# Patient Record
Sex: Female | Born: 1950
Health system: Southern US, Community
[De-identification: ages and names within clinical notes are randomized; demographics above are authoritative.]

## PROBLEM LIST (undated history)

## (undated) DIAGNOSIS — T4145XA Adverse effect of unspecified anesthetic, initial encounter: Secondary | ICD-10-CM

## (undated) DIAGNOSIS — Z9289 Personal history of other medical treatment: Secondary | ICD-10-CM

## (undated) DIAGNOSIS — C4490 Unspecified malignant neoplasm of skin, unspecified: Secondary | ICD-10-CM

## (undated) DIAGNOSIS — M810 Age-related osteoporosis without current pathological fracture: Secondary | ICD-10-CM

## (undated) DIAGNOSIS — Z7189 Other specified counseling: Secondary | ICD-10-CM

## (undated) DIAGNOSIS — Z9221 Personal history of antineoplastic chemotherapy: Secondary | ICD-10-CM

## (undated) DIAGNOSIS — T8859XA Other complications of anesthesia, initial encounter: Secondary | ICD-10-CM

## (undated) DIAGNOSIS — H9312 Tinnitus, left ear: Secondary | ICD-10-CM

## (undated) DIAGNOSIS — C50911 Malignant neoplasm of unspecified site of right female breast: Secondary | ICD-10-CM

## (undated) DIAGNOSIS — C785 Secondary malignant neoplasm of large intestine and rectum: Principal | ICD-10-CM

## (undated) DIAGNOSIS — B029 Zoster without complications: Secondary | ICD-10-CM

## (undated) HISTORY — DX: Zoster without complications: B02.9

## (undated) HISTORY — DX: Other specified counseling: Z71.89

## (undated) HISTORY — DX: Age-related osteoporosis without current pathological fracture: M81.0

## (undated) HISTORY — PX: COLONOSCOPY: SHX174

## (undated) HISTORY — DX: Tinnitus, left ear: H93.12

## (undated) HISTORY — PX: CATARACT EXTRACTION: SUR2

## (undated) HISTORY — DX: Malignant neoplasm of unspecified site of right female breast: C50.911

## (undated) HISTORY — DX: Secondary malignant neoplasm of large intestine and rectum: C78.5

## (undated) HISTORY — DX: Personal history of other medical treatment: Z92.89

---

## 1976-07-04 HISTORY — PX: OTHER SURGICAL HISTORY: SHX169

## 2000-08-07 ENCOUNTER — Other Ambulatory Visit: Admission: RE | Admit: 2000-08-07 | Discharge: 2000-08-07 | Payer: Self-pay | Admitting: Obstetrics & Gynecology

## 2000-08-10 ENCOUNTER — Encounter: Payer: Self-pay | Admitting: Obstetrics & Gynecology

## 2000-08-10 ENCOUNTER — Encounter: Admission: RE | Admit: 2000-08-10 | Discharge: 2000-08-10 | Payer: Self-pay | Admitting: Obstetrics & Gynecology

## 2001-09-10 ENCOUNTER — Other Ambulatory Visit: Admission: RE | Admit: 2001-09-10 | Discharge: 2001-09-10 | Payer: Self-pay | Admitting: Obstetrics & Gynecology

## 2001-09-14 ENCOUNTER — Encounter: Payer: Self-pay | Admitting: Obstetrics & Gynecology

## 2001-09-14 ENCOUNTER — Encounter: Admission: RE | Admit: 2001-09-14 | Discharge: 2001-09-14 | Payer: Self-pay | Admitting: Obstetrics & Gynecology

## 2002-10-08 ENCOUNTER — Other Ambulatory Visit: Admission: RE | Admit: 2002-10-08 | Discharge: 2002-10-08 | Payer: Self-pay | Admitting: Obstetrics and Gynecology

## 2003-12-12 ENCOUNTER — Other Ambulatory Visit: Admission: RE | Admit: 2003-12-12 | Discharge: 2003-12-12 | Payer: Self-pay | Admitting: Obstetrics and Gynecology

## 2004-04-05 ENCOUNTER — Ambulatory Visit (HOSPITAL_COMMUNITY): Admission: RE | Admit: 2004-04-05 | Discharge: 2004-04-05 | Payer: Self-pay | Admitting: Gastroenterology

## 2004-04-05 ENCOUNTER — Encounter (INDEPENDENT_AMBULATORY_CARE_PROVIDER_SITE_OTHER): Payer: Self-pay | Admitting: *Deleted

## 2005-04-26 ENCOUNTER — Other Ambulatory Visit: Admission: RE | Admit: 2005-04-26 | Discharge: 2005-04-26 | Payer: Self-pay | Admitting: Obstetrics and Gynecology

## 2008-07-02 ENCOUNTER — Encounter: Admission: RE | Admit: 2008-07-02 | Discharge: 2008-07-02 | Payer: Self-pay | Admitting: Obstetrics and Gynecology

## 2010-11-19 NOTE — Op Note (Signed)
NAMESHAREENA, NUSZ                  ACCOUNT NO.:  [id number]   MEDICAL RECORD NO.:  [id number]          PATIENT TYPE:  AMB   LOCATION:  ENDO                         FACILITY:  [facility]   PHYSICIAN:  Anselmo Rod, M.D.  DATE OF BIRTH:  [birth year]   DATE OF PROCEDURE:  04/05/2004  DATE OF DISCHARGE:                                 OPERATIVE REPORT   PROCEDURE PERFORMED:  Colonoscopy with cold biopsies x1.   ENDOSCOPIST:  Anselmo Rod, M.D.   INSTRUMENT USED:  Olympus video colonoscope.   INDICATION FOR PROCEDURE:  A 60 year old white female undergoing a screening  colonoscopy to rule out colonic polyps, etc.   PREPROCEDURE PREPARATION:  Informed consent was procured from the patient.  The patient was fasted for eight hours prior to the procedure and prepped  with a bottle of magnesium citrate and a gallon of GoLYTELY the night prior  to the procedure.   PREPROCEDURE PHYSICAL:  VITAL SIGNS:  The patient had stable vital signs.  NECK:  Supple.  CHEST:  Clear to auscultation.  S1, S2 regular.  ABDOMEN:  Soft with normal bowel sounds.   DESCRIPTION OF PROCEDURE:  The patient was placed in the left lateral  decubitus position and sedated with 75 mg of Demerol and 7.5 mg of Versed in  slow incremental doses.  Once the patient was adequately sedate and  maintained on low-flow oxygen and continuous cardiac monitoring, the Olympus  video colonoscope was advanced from the rectum to the cecum.  The  appendiceal orifice and the ileocecal valve were clearly visualized and  photographed.  A small sessile polyp was biopsied from the cecal base.  A  small internal hemorrhoid was seen on retroflexion.  There was no evidence  of diverticulosis.  The terminal ileum appeared healthy.  The appendiceal  orifice and the ileocecal valve were visualized and photographed.   IMPRESSION:  1.  Small internal hemorrhoids  2.  Small sessile polyp biopsied with the cold biopsy forceps from the cecal  base.  3.  Normal terminal ileum.   RECOMMENDATIONS:  1.  Await pathology results.  2.  Repeat colonoscopy depending on pathology results.  3.  Outpatient follow-up as the need arises in the future.  The patient will      have a colonoscopy at an earlier date if she has any abnormal symptoms      in the interim.       JNM/MEDQ  D:  04/05/2004  T:  04/05/2004  Job:  [id number]   cc:   Marcelino Duster L. Vincente Poli, M.D.  [address], [address]  [place]  [place] [address]  Fax: [phone]   Doris Cheadle. Foy Guadalajara, M.D.  [address] [place]  [place] [address]  Fax: [phone]

## 2012-03-28 ENCOUNTER — Other Ambulatory Visit (HOSPITAL_BASED_OUTPATIENT_CLINIC_OR_DEPARTMENT_OTHER): Payer: Self-pay | Admitting: Family Medicine

## 2012-03-28 DIAGNOSIS — M545 Low back pain: Secondary | ICD-10-CM

## 2012-03-31 ENCOUNTER — Ambulatory Visit (HOSPITAL_BASED_OUTPATIENT_CLINIC_OR_DEPARTMENT_OTHER)
Admission: RE | Admit: 2012-03-31 | Discharge: 2012-03-31 | Disposition: A | Discharge status: Home / Self Care | Payer: Federal, State, Local not specified - PPO | Source: Ambulatory Visit | Attending: Family Medicine | Admitting: Family Medicine

## 2012-03-31 DIAGNOSIS — M545 Low back pain: Secondary | ICD-10-CM

## 2012-03-31 DIAGNOSIS — M5126 Other intervertebral disc displacement, lumbar region: Secondary | ICD-10-CM | POA: Insufficient documentation

## 2012-06-13 ENCOUNTER — Other Ambulatory Visit: Payer: Self-pay | Admitting: Family Medicine

## 2012-06-13 DIAGNOSIS — R109 Unspecified abdominal pain: Secondary | ICD-10-CM

## 2012-06-14 ENCOUNTER — Ambulatory Visit
Admission: RE | Admit: 2012-06-14 | Discharge: 2012-06-14 | Disposition: A | Discharge status: Home / Self Care | Payer: Federal, State, Local not specified - PPO | Source: Ambulatory Visit | Attending: Family Medicine | Admitting: Family Medicine

## 2012-06-14 DIAGNOSIS — R109 Unspecified abdominal pain: Secondary | ICD-10-CM

## 2012-08-08 ENCOUNTER — Other Ambulatory Visit: Payer: Self-pay | Admitting: Obstetrics and Gynecology

## 2013-11-06 ENCOUNTER — Other Ambulatory Visit: Payer: Self-pay | Admitting: Obstetrics and Gynecology

## 2013-11-14 ENCOUNTER — Other Ambulatory Visit: Payer: Self-pay | Admitting: Obstetrics and Gynecology

## 2013-11-14 DIAGNOSIS — R928 Other abnormal and inconclusive findings on diagnostic imaging of breast: Secondary | ICD-10-CM

## 2013-12-04 ENCOUNTER — Ambulatory Visit
Admission: RE | Admit: 2013-12-04 | Discharge: 2013-12-04 | Disposition: A | Discharge status: Home / Self Care | Payer: Federal, State, Local not specified - PPO | Source: Ambulatory Visit | Attending: Obstetrics and Gynecology | Admitting: Obstetrics and Gynecology

## 2013-12-04 ENCOUNTER — Encounter (INDEPENDENT_AMBULATORY_CARE_PROVIDER_SITE_OTHER): Payer: Self-pay

## 2013-12-04 DIAGNOSIS — R928 Other abnormal and inconclusive findings on diagnostic imaging of breast: Secondary | ICD-10-CM

## 2014-04-30 ENCOUNTER — Other Ambulatory Visit: Payer: Self-pay | Admitting: Obstetrics and Gynecology

## 2014-04-30 DIAGNOSIS — N63 Unspecified lump in unspecified breast: Secondary | ICD-10-CM

## 2014-06-06 ENCOUNTER — Ambulatory Visit
Admission: RE | Admit: 2014-06-06 | Discharge: 2014-06-06 | Disposition: A | Discharge status: Home / Self Care | Payer: Federal, State, Local not specified - PPO | Source: Ambulatory Visit | Attending: Obstetrics and Gynecology | Admitting: Obstetrics and Gynecology

## 2014-06-06 DIAGNOSIS — N63 Unspecified lump in unspecified breast: Secondary | ICD-10-CM

## 2015-01-26 ENCOUNTER — Other Ambulatory Visit: Payer: Self-pay | Admitting: Obstetrics and Gynecology

## 2015-01-26 DIAGNOSIS — N63 Unspecified lump in unspecified breast: Secondary | ICD-10-CM

## 2015-01-27 LAB — CYTOLOGY - PAP

## 2015-01-29 ENCOUNTER — Ambulatory Visit
Admission: RE | Admit: 2015-01-29 | Discharge: 2015-01-29 | Disposition: A | Discharge status: Home / Self Care | Payer: Federal, State, Local not specified - PPO | Source: Ambulatory Visit | Attending: Obstetrics and Gynecology | Admitting: Obstetrics and Gynecology

## 2015-01-29 DIAGNOSIS — N63 Unspecified lump in unspecified breast: Secondary | ICD-10-CM

## 2015-10-01 DIAGNOSIS — R1909 Other intra-abdominal and pelvic swelling, mass and lump: Secondary | ICD-10-CM | POA: Diagnosis not present

## 2015-10-01 DIAGNOSIS — K5900 Constipation, unspecified: Secondary | ICD-10-CM | POA: Diagnosis not present

## 2015-10-01 DIAGNOSIS — R1031 Right lower quadrant pain: Secondary | ICD-10-CM | POA: Diagnosis not present

## 2015-10-05 ENCOUNTER — Other Ambulatory Visit (HOSPITAL_BASED_OUTPATIENT_CLINIC_OR_DEPARTMENT_OTHER): Payer: Self-pay | Admitting: Family Medicine

## 2015-10-05 DIAGNOSIS — R1031 Right lower quadrant pain: Secondary | ICD-10-CM

## 2015-10-08 ENCOUNTER — Ambulatory Visit (HOSPITAL_BASED_OUTPATIENT_CLINIC_OR_DEPARTMENT_OTHER)
Admission: RE | Admit: 2015-10-08 | Discharge: 2015-10-08 | Disposition: A | Discharge status: Home / Self Care | Payer: Medicare Other | Source: Ambulatory Visit | Attending: Family Medicine | Admitting: Family Medicine

## 2015-10-08 DIAGNOSIS — R1031 Right lower quadrant pain: Secondary | ICD-10-CM | POA: Diagnosis not present

## 2015-10-19 DIAGNOSIS — K5900 Constipation, unspecified: Secondary | ICD-10-CM | POA: Diagnosis not present

## 2015-10-19 DIAGNOSIS — R109 Unspecified abdominal pain: Secondary | ICD-10-CM | POA: Diagnosis not present

## 2015-12-24 ENCOUNTER — Other Ambulatory Visit: Payer: Self-pay | Admitting: Gastroenterology

## 2015-12-24 DIAGNOSIS — K5730 Diverticulosis of large intestine without perforation or abscess without bleeding: Secondary | ICD-10-CM | POA: Diagnosis not present

## 2015-12-24 DIAGNOSIS — Z1211 Encounter for screening for malignant neoplasm of colon: Secondary | ICD-10-CM | POA: Diagnosis not present

## 2015-12-24 DIAGNOSIS — D126 Benign neoplasm of colon, unspecified: Secondary | ICD-10-CM | POA: Diagnosis not present

## 2015-12-24 DIAGNOSIS — D120 Benign neoplasm of cecum: Secondary | ICD-10-CM | POA: Diagnosis not present

## 2016-01-14 ENCOUNTER — Other Ambulatory Visit: Payer: Self-pay | Admitting: Obstetrics and Gynecology

## 2016-01-14 DIAGNOSIS — N6320 Unspecified lump in the left breast, unspecified quadrant: Secondary | ICD-10-CM

## 2016-02-01 ENCOUNTER — Ambulatory Visit
Admission: RE | Admit: 2016-02-01 | Discharge: 2016-02-01 | Disposition: A | Discharge status: Home / Self Care | Payer: Medicare Other | Source: Ambulatory Visit | Attending: Obstetrics and Gynecology | Admitting: Obstetrics and Gynecology

## 2016-02-01 DIAGNOSIS — N6320 Unspecified lump in the left breast, unspecified quadrant: Secondary | ICD-10-CM

## 2016-02-01 DIAGNOSIS — N63 Unspecified lump in breast: Secondary | ICD-10-CM | POA: Diagnosis not present

## 2016-02-01 DIAGNOSIS — R922 Inconclusive mammogram: Secondary | ICD-10-CM | POA: Diagnosis not present

## 2016-03-28 DIAGNOSIS — Z124 Encounter for screening for malignant neoplasm of cervix: Secondary | ICD-10-CM | POA: Diagnosis not present

## 2016-03-28 DIAGNOSIS — Z6821 Body mass index (BMI) 21.0-21.9, adult: Secondary | ICD-10-CM | POA: Diagnosis not present

## 2016-04-19 DIAGNOSIS — S8991XA Unspecified injury of right lower leg, initial encounter: Secondary | ICD-10-CM | POA: Diagnosis not present

## 2016-04-19 DIAGNOSIS — M7989 Other specified soft tissue disorders: Secondary | ICD-10-CM | POA: Diagnosis not present

## 2016-04-19 DIAGNOSIS — M545 Low back pain: Secondary | ICD-10-CM | POA: Diagnosis not present

## 2016-05-30 DIAGNOSIS — Z23 Encounter for immunization: Secondary | ICD-10-CM | POA: Diagnosis not present

## 2016-09-26 DIAGNOSIS — L989 Disorder of the skin and subcutaneous tissue, unspecified: Secondary | ICD-10-CM | POA: Diagnosis not present

## 2016-11-22 DIAGNOSIS — D225 Melanocytic nevi of trunk: Secondary | ICD-10-CM | POA: Diagnosis not present

## 2016-11-22 DIAGNOSIS — L821 Other seborrheic keratosis: Secondary | ICD-10-CM | POA: Diagnosis not present

## 2016-11-22 DIAGNOSIS — L820 Inflamed seborrheic keratosis: Secondary | ICD-10-CM | POA: Diagnosis not present

## 2016-11-22 DIAGNOSIS — C44519 Basal cell carcinoma of skin of other part of trunk: Secondary | ICD-10-CM | POA: Diagnosis not present

## 2016-11-22 DIAGNOSIS — D485 Neoplasm of uncertain behavior of skin: Secondary | ICD-10-CM | POA: Diagnosis not present

## 2016-11-22 DIAGNOSIS — D2372 Other benign neoplasm of skin of left lower limb, including hip: Secondary | ICD-10-CM | POA: Diagnosis not present

## 2016-12-07 DIAGNOSIS — C44519 Basal cell carcinoma of skin of other part of trunk: Secondary | ICD-10-CM | POA: Diagnosis not present

## 2016-12-09 DIAGNOSIS — M7660 Achilles tendinitis, unspecified leg: Secondary | ICD-10-CM | POA: Diagnosis not present

## 2017-03-20 ENCOUNTER — Other Ambulatory Visit: Payer: Self-pay | Admitting: Obstetrics and Gynecology

## 2017-03-20 DIAGNOSIS — Z1231 Encounter for screening mammogram for malignant neoplasm of breast: Secondary | ICD-10-CM

## 2017-03-28 DIAGNOSIS — M1712 Unilateral primary osteoarthritis, left knee: Secondary | ICD-10-CM | POA: Diagnosis not present

## 2017-04-03 DIAGNOSIS — M1712 Unilateral primary osteoarthritis, left knee: Secondary | ICD-10-CM | POA: Diagnosis not present

## 2017-04-05 ENCOUNTER — Ambulatory Visit
Admission: RE | Admit: 2017-04-05 | Discharge: 2017-04-05 | Disposition: A | Discharge status: Home / Self Care | Payer: Medicare Other | Source: Ambulatory Visit | Attending: Obstetrics and Gynecology | Admitting: Obstetrics and Gynecology

## 2017-04-05 DIAGNOSIS — Z1231 Encounter for screening mammogram for malignant neoplasm of breast: Secondary | ICD-10-CM

## 2017-04-06 DIAGNOSIS — M1712 Unilateral primary osteoarthritis, left knee: Secondary | ICD-10-CM | POA: Diagnosis not present

## 2017-04-10 DIAGNOSIS — R42 Dizziness and giddiness: Secondary | ICD-10-CM | POA: Diagnosis not present

## 2017-04-10 DIAGNOSIS — Z0000 Encounter for general adult medical examination without abnormal findings: Secondary | ICD-10-CM | POA: Diagnosis not present

## 2017-04-10 DIAGNOSIS — E785 Hyperlipidemia, unspecified: Secondary | ICD-10-CM | POA: Diagnosis not present

## 2017-04-10 DIAGNOSIS — M1712 Unilateral primary osteoarthritis, left knee: Secondary | ICD-10-CM | POA: Diagnosis not present

## 2017-04-12 DIAGNOSIS — M1712 Unilateral primary osteoarthritis, left knee: Secondary | ICD-10-CM | POA: Diagnosis not present

## 2017-04-27 DIAGNOSIS — M1712 Unilateral primary osteoarthritis, left knee: Secondary | ICD-10-CM | POA: Diagnosis not present

## 2017-04-28 DIAGNOSIS — Z23 Encounter for immunization: Secondary | ICD-10-CM | POA: Diagnosis not present

## 2017-05-03 DIAGNOSIS — M1712 Unilateral primary osteoarthritis, left knee: Secondary | ICD-10-CM | POA: Diagnosis not present

## 2017-05-05 DIAGNOSIS — M1712 Unilateral primary osteoarthritis, left knee: Secondary | ICD-10-CM | POA: Diagnosis not present

## 2017-05-08 DIAGNOSIS — M1712 Unilateral primary osteoarthritis, left knee: Secondary | ICD-10-CM | POA: Diagnosis not present

## 2017-05-12 DIAGNOSIS — M1712 Unilateral primary osteoarthritis, left knee: Secondary | ICD-10-CM | POA: Diagnosis not present

## 2017-05-15 DIAGNOSIS — M1712 Unilateral primary osteoarthritis, left knee: Secondary | ICD-10-CM | POA: Diagnosis not present

## 2017-05-17 DIAGNOSIS — M1712 Unilateral primary osteoarthritis, left knee: Secondary | ICD-10-CM | POA: Diagnosis not present

## 2017-05-22 DIAGNOSIS — M1712 Unilateral primary osteoarthritis, left knee: Secondary | ICD-10-CM | POA: Diagnosis not present

## 2017-07-12 DIAGNOSIS — C44311 Basal cell carcinoma of skin of nose: Secondary | ICD-10-CM | POA: Diagnosis not present

## 2017-07-12 DIAGNOSIS — L570 Actinic keratosis: Secondary | ICD-10-CM | POA: Diagnosis not present

## 2017-07-12 DIAGNOSIS — D225 Melanocytic nevi of trunk: Secondary | ICD-10-CM | POA: Diagnosis not present

## 2017-07-12 DIAGNOSIS — D2372 Other benign neoplasm of skin of left lower limb, including hip: Secondary | ICD-10-CM | POA: Diagnosis not present

## 2017-07-12 DIAGNOSIS — L821 Other seborrheic keratosis: Secondary | ICD-10-CM | POA: Diagnosis not present

## 2017-07-12 DIAGNOSIS — D485 Neoplasm of uncertain behavior of skin: Secondary | ICD-10-CM | POA: Diagnosis not present

## 2017-07-12 DIAGNOSIS — Z23 Encounter for immunization: Secondary | ICD-10-CM | POA: Diagnosis not present

## 2017-07-12 DIAGNOSIS — Z85828 Personal history of other malignant neoplasm of skin: Secondary | ICD-10-CM | POA: Diagnosis not present

## 2017-07-12 DIAGNOSIS — D1800 Hemangioma unspecified site: Secondary | ICD-10-CM | POA: Diagnosis not present

## 2017-07-19 DIAGNOSIS — Z6822 Body mass index (BMI) 22.0-22.9, adult: Secondary | ICD-10-CM | POA: Diagnosis not present

## 2017-07-19 DIAGNOSIS — Z01419 Encounter for gynecological examination (general) (routine) without abnormal findings: Secondary | ICD-10-CM | POA: Diagnosis not present

## 2017-09-26 DIAGNOSIS — C44311 Basal cell carcinoma of skin of nose: Secondary | ICD-10-CM | POA: Diagnosis not present

## 2017-11-28 DIAGNOSIS — H47392 Other disorders of optic disc, left eye: Secondary | ICD-10-CM | POA: Diagnosis not present

## 2017-11-28 DIAGNOSIS — H43392 Other vitreous opacities, left eye: Secondary | ICD-10-CM | POA: Diagnosis not present

## 2017-11-28 DIAGNOSIS — H2513 Age-related nuclear cataract, bilateral: Secondary | ICD-10-CM | POA: Diagnosis not present

## 2017-11-28 DIAGNOSIS — H25013 Cortical age-related cataract, bilateral: Secondary | ICD-10-CM | POA: Diagnosis not present

## 2017-12-13 DIAGNOSIS — H25013 Cortical age-related cataract, bilateral: Secondary | ICD-10-CM | POA: Diagnosis not present

## 2017-12-13 DIAGNOSIS — H43392 Other vitreous opacities, left eye: Secondary | ICD-10-CM | POA: Diagnosis not present

## 2017-12-13 DIAGNOSIS — H47392 Other disorders of optic disc, left eye: Secondary | ICD-10-CM | POA: Diagnosis not present

## 2017-12-13 DIAGNOSIS — H43812 Vitreous degeneration, left eye: Secondary | ICD-10-CM | POA: Diagnosis not present

## 2018-01-16 DIAGNOSIS — H43812 Vitreous degeneration, left eye: Secondary | ICD-10-CM | POA: Diagnosis not present

## 2018-01-16 DIAGNOSIS — H47392 Other disorders of optic disc, left eye: Secondary | ICD-10-CM | POA: Diagnosis not present

## 2018-01-16 DIAGNOSIS — H43392 Other vitreous opacities, left eye: Secondary | ICD-10-CM | POA: Diagnosis not present

## 2018-01-16 DIAGNOSIS — H25013 Cortical age-related cataract, bilateral: Secondary | ICD-10-CM | POA: Diagnosis not present

## 2018-01-18 DIAGNOSIS — R238 Other skin changes: Secondary | ICD-10-CM | POA: Diagnosis not present

## 2018-01-18 DIAGNOSIS — L570 Actinic keratosis: Secondary | ICD-10-CM | POA: Diagnosis not present

## 2018-01-18 DIAGNOSIS — Z85828 Personal history of other malignant neoplasm of skin: Secondary | ICD-10-CM | POA: Diagnosis not present

## 2018-01-18 DIAGNOSIS — D225 Melanocytic nevi of trunk: Secondary | ICD-10-CM | POA: Diagnosis not present

## 2018-01-18 DIAGNOSIS — D2372 Other benign neoplasm of skin of left lower limb, including hip: Secondary | ICD-10-CM | POA: Diagnosis not present

## 2018-01-18 DIAGNOSIS — L821 Other seborrheic keratosis: Secondary | ICD-10-CM | POA: Diagnosis not present

## 2018-01-18 DIAGNOSIS — D1800 Hemangioma unspecified site: Secondary | ICD-10-CM | POA: Diagnosis not present

## 2018-02-12 DIAGNOSIS — M25362 Other instability, left knee: Secondary | ICD-10-CM | POA: Diagnosis not present

## 2018-02-16 DIAGNOSIS — M25562 Pain in left knee: Secondary | ICD-10-CM | POA: Diagnosis not present

## 2018-02-20 ENCOUNTER — Other Ambulatory Visit: Payer: Self-pay | Admitting: Obstetrics and Gynecology

## 2018-02-20 DIAGNOSIS — Z1231 Encounter for screening mammogram for malignant neoplasm of breast: Secondary | ICD-10-CM

## 2018-02-23 DIAGNOSIS — M25562 Pain in left knee: Secondary | ICD-10-CM | POA: Diagnosis not present

## 2018-03-07 ENCOUNTER — Other Ambulatory Visit: Payer: Self-pay | Admitting: Sports Medicine

## 2018-03-07 DIAGNOSIS — M25562 Pain in left knee: Secondary | ICD-10-CM

## 2018-03-14 ENCOUNTER — Ambulatory Visit
Admission: RE | Admit: 2018-03-14 | Discharge: 2018-03-14 | Disposition: A | Discharge status: Home / Self Care | Payer: Medicare Other | Source: Ambulatory Visit | Attending: Sports Medicine | Admitting: Sports Medicine

## 2018-03-14 DIAGNOSIS — M25562 Pain in left knee: Secondary | ICD-10-CM | POA: Diagnosis not present

## 2018-03-26 DIAGNOSIS — M1712 Unilateral primary osteoarthritis, left knee: Secondary | ICD-10-CM | POA: Insufficient documentation

## 2018-03-26 DIAGNOSIS — M25562 Pain in left knee: Secondary | ICD-10-CM | POA: Diagnosis not present

## 2018-04-16 ENCOUNTER — Ambulatory Visit
Admission: RE | Admit: 2018-04-16 | Discharge: 2018-04-16 | Disposition: A | Discharge status: Home / Self Care | Payer: Medicare Other | Source: Ambulatory Visit | Attending: Obstetrics and Gynecology | Admitting: Obstetrics and Gynecology

## 2018-04-16 DIAGNOSIS — Z1231 Encounter for screening mammogram for malignant neoplasm of breast: Secondary | ICD-10-CM

## 2018-05-22 DIAGNOSIS — M25562 Pain in left knee: Secondary | ICD-10-CM | POA: Diagnosis not present

## 2018-05-22 DIAGNOSIS — M1712 Unilateral primary osteoarthritis, left knee: Secondary | ICD-10-CM | POA: Diagnosis not present

## 2018-06-04 DIAGNOSIS — Z23 Encounter for immunization: Secondary | ICD-10-CM | POA: Diagnosis not present

## 2018-06-20 DIAGNOSIS — Z0000 Encounter for general adult medical examination without abnormal findings: Secondary | ICD-10-CM | POA: Diagnosis not present

## 2018-06-20 DIAGNOSIS — Z23 Encounter for immunization: Secondary | ICD-10-CM | POA: Diagnosis not present

## 2018-06-20 DIAGNOSIS — Z13820 Encounter for screening for osteoporosis: Secondary | ICD-10-CM | POA: Diagnosis not present

## 2018-06-21 ENCOUNTER — Other Ambulatory Visit (HOSPITAL_BASED_OUTPATIENT_CLINIC_OR_DEPARTMENT_OTHER): Payer: Self-pay | Admitting: Physician Assistant

## 2018-06-21 DIAGNOSIS — Z780 Asymptomatic menopausal state: Secondary | ICD-10-CM

## 2018-07-06 ENCOUNTER — Other Ambulatory Visit (HOSPITAL_BASED_OUTPATIENT_CLINIC_OR_DEPARTMENT_OTHER): Payer: Self-pay | Admitting: Physician Assistant

## 2018-07-06 ENCOUNTER — Encounter (HOSPITAL_BASED_OUTPATIENT_CLINIC_OR_DEPARTMENT_OTHER): Payer: Self-pay

## 2018-07-06 ENCOUNTER — Ambulatory Visit (HOSPITAL_BASED_OUTPATIENT_CLINIC_OR_DEPARTMENT_OTHER)
Admission: RE | Admit: 2018-07-06 | Discharge: 2018-07-06 | Disposition: A | Discharge status: Home / Self Care | Payer: Medicare Other | Source: Ambulatory Visit | Attending: Physician Assistant | Admitting: Physician Assistant

## 2018-07-06 DIAGNOSIS — R1031 Right lower quadrant pain: Secondary | ICD-10-CM

## 2018-07-06 DIAGNOSIS — R10829 Rebound abdominal tenderness, unspecified site: Secondary | ICD-10-CM | POA: Diagnosis not present

## 2018-07-06 MED ORDER — IOPAMIDOL (ISOVUE-300) INJECTION 61%
100.0000 mL | Freq: Once | INTRAVENOUS | Status: AC | PRN
  Administered 2018-07-06: 100 mL via INTRAVENOUS

## 2018-07-11 ENCOUNTER — Ambulatory Visit (HOSPITAL_BASED_OUTPATIENT_CLINIC_OR_DEPARTMENT_OTHER)
Admission: RE | Admit: 2018-07-11 | Discharge: 2018-07-11 | Disposition: A | Discharge status: Home / Self Care | Payer: Medicare Other | Source: Ambulatory Visit | Attending: Physician Assistant | Admitting: Physician Assistant

## 2018-07-11 DIAGNOSIS — Z780 Asymptomatic menopausal state: Secondary | ICD-10-CM | POA: Insufficient documentation

## 2018-07-13 DIAGNOSIS — R935 Abnormal findings on diagnostic imaging of other abdominal regions, including retroperitoneum: Secondary | ICD-10-CM | POA: Diagnosis not present

## 2018-07-19 DIAGNOSIS — L570 Actinic keratosis: Secondary | ICD-10-CM | POA: Diagnosis not present

## 2018-07-19 DIAGNOSIS — D225 Melanocytic nevi of trunk: Secondary | ICD-10-CM | POA: Diagnosis not present

## 2018-07-19 DIAGNOSIS — R238 Other skin changes: Secondary | ICD-10-CM | POA: Diagnosis not present

## 2018-07-19 DIAGNOSIS — Z23 Encounter for immunization: Secondary | ICD-10-CM | POA: Diagnosis not present

## 2018-07-19 DIAGNOSIS — D485 Neoplasm of uncertain behavior of skin: Secondary | ICD-10-CM | POA: Diagnosis not present

## 2018-07-19 DIAGNOSIS — Z85828 Personal history of other malignant neoplasm of skin: Secondary | ICD-10-CM | POA: Diagnosis not present

## 2018-07-25 ENCOUNTER — Ambulatory Visit (HOSPITAL_BASED_OUTPATIENT_CLINIC_OR_DEPARTMENT_OTHER)
Admission: RE | Admit: 2018-07-25 | Discharge: 2018-07-25 | Disposition: A | Discharge status: Home / Self Care | Payer: Medicare Other | Source: Ambulatory Visit | Attending: Physician Assistant | Admitting: Physician Assistant

## 2018-07-25 DIAGNOSIS — M8589 Other specified disorders of bone density and structure, multiple sites: Secondary | ICD-10-CM | POA: Diagnosis not present

## 2018-07-25 DIAGNOSIS — Z780 Asymptomatic menopausal state: Secondary | ICD-10-CM | POA: Diagnosis not present

## 2018-07-27 DIAGNOSIS — C785 Secondary malignant neoplasm of large intestine and rectum: Secondary | ICD-10-CM | POA: Diagnosis not present

## 2018-07-27 DIAGNOSIS — K56699 Other intestinal obstruction unspecified as to partial versus complete obstruction: Secondary | ICD-10-CM | POA: Diagnosis not present

## 2018-07-27 DIAGNOSIS — K5730 Diverticulosis of large intestine without perforation or abscess without bleeding: Secondary | ICD-10-CM | POA: Diagnosis not present

## 2018-07-27 DIAGNOSIS — R933 Abnormal findings on diagnostic imaging of other parts of digestive tract: Secondary | ICD-10-CM | POA: Diagnosis not present

## 2018-08-01 DIAGNOSIS — C785 Secondary malignant neoplasm of large intestine and rectum: Secondary | ICD-10-CM | POA: Diagnosis not present

## 2018-08-02 ENCOUNTER — Telehealth: Payer: Self-pay

## 2018-08-02 DIAGNOSIS — C189 Malignant neoplasm of colon, unspecified: Secondary | ICD-10-CM

## 2018-08-02 NOTE — Telephone Encounter (Signed)
  Oncology Nurse Navigator Documentation   Called patient to introduce myself and to provide contact information. Patient lives in [place] and would like initial oncology consult at [facility]. Staff message sent to Charlsie Merles RN to help with scheduling apt. I encouarged patient to call with questions or concerns.

## 2018-08-03 ENCOUNTER — Encounter: Payer: Self-pay | Admitting: *Deleted

## 2018-08-03 NOTE — Progress Notes (Signed)
Reached out to Martha Osborne to introduce myself as the office RN Navigator and explain our new patient process. Reviewed the reason for their referral and scheduled their new patient appointment along with labs. Provided address and directions to the office including call back phone number. Reviewed with patient any concerns they may have or any possible barriers to attending their appointment.   Informed patient about my role as a navigator and that I will meet with them prior to their New Patient appointment and more fully discuss what services I can provide. At this time patient has no further questions or needs.

## 2018-08-06 ENCOUNTER — Encounter: Payer: Self-pay | Admitting: *Deleted

## 2018-08-10 ENCOUNTER — Telehealth: Payer: Self-pay | Admitting: Hematology & Oncology

## 2018-08-10 ENCOUNTER — Other Ambulatory Visit: Payer: Self-pay

## 2018-08-10 ENCOUNTER — Inpatient Hospital Stay (HOSPITAL_BASED_OUTPATIENT_CLINIC_OR_DEPARTMENT_OTHER): Payer: Medicare Other | Admitting: Hematology & Oncology

## 2018-08-10 ENCOUNTER — Inpatient Hospital Stay: Payer: Medicare Other | Attending: Hematology & Oncology

## 2018-08-10 ENCOUNTER — Encounter: Payer: Self-pay | Admitting: *Deleted

## 2018-08-10 ENCOUNTER — Encounter: Payer: Self-pay | Admitting: Hematology & Oncology

## 2018-08-10 ENCOUNTER — Ambulatory Visit (HOSPITAL_BASED_OUTPATIENT_CLINIC_OR_DEPARTMENT_OTHER)
Admission: RE | Admit: 2018-08-10 | Discharge: 2018-08-10 | Disposition: A | Discharge status: Home / Self Care | Payer: Medicare Other | Source: Ambulatory Visit | Attending: Hematology & Oncology | Admitting: Hematology & Oncology

## 2018-08-10 VITALS — BP 138/59 | HR 74 | Temp 98.6°F | Resp 16 | Ht 66.0 in | Wt 137.0 lb

## 2018-08-10 DIAGNOSIS — C189 Malignant neoplasm of colon, unspecified: Secondary | ICD-10-CM | POA: Insufficient documentation

## 2018-08-10 DIAGNOSIS — Z79899 Other long term (current) drug therapy: Secondary | ICD-10-CM | POA: Insufficient documentation

## 2018-08-10 DIAGNOSIS — C785 Secondary malignant neoplasm of large intestine and rectum: Secondary | ICD-10-CM

## 2018-08-10 DIAGNOSIS — Z170 Estrogen receptor positive status [ER+]: Secondary | ICD-10-CM | POA: Diagnosis not present

## 2018-08-10 DIAGNOSIS — Z79811 Long term (current) use of aromatase inhibitors: Secondary | ICD-10-CM | POA: Diagnosis not present

## 2018-08-10 DIAGNOSIS — C50312 Malignant neoplasm of lower-inner quadrant of left female breast: Secondary | ICD-10-CM | POA: Insufficient documentation

## 2018-08-10 DIAGNOSIS — C50912 Malignant neoplasm of unspecified site of left female breast: Secondary | ICD-10-CM | POA: Diagnosis not present

## 2018-08-10 DIAGNOSIS — C50919 Malignant neoplasm of unspecified site of unspecified female breast: Secondary | ICD-10-CM

## 2018-08-10 DIAGNOSIS — R918 Other nonspecific abnormal finding of lung field: Secondary | ICD-10-CM | POA: Diagnosis not present

## 2018-08-10 LAB — CMP (CANCER CENTER ONLY)
ALT: 9 U/L (ref 0–44)
AST: 15 U/L (ref 15–41)
Albumin: 4.4 g/dL (ref 3.5–5.0)
Alkaline Phosphatase: 59 U/L (ref 38–126)
Anion gap: 7 (ref 5–15)
BUN: 15 mg/dL (ref 8–23)
CO2: 29 mmol/L (ref 22–32)
Calcium: 9.9 mg/dL (ref 8.9–10.3)
Chloride: 100 mmol/L (ref 98–111)
Creatinine: 0.85 mg/dL (ref 0.44–1.00)
GFR, Est AFR Am: >60 mL/min (ref >60)
GFR, Estimated: >60 mL/min (ref >60)
Glucose, Bld: 145 mg/dL — ABNORMAL HIGH (ref 70–99)
Potassium: 4.2 mmol/L (ref 3.5–5.1)
Sodium: 136 mmol/L (ref 135–145)
Total Bilirubin: 0.4 mg/dL (ref 0.3–1.2)
Total Protein: 6.9 g/dL (ref 6.5–8.1)

## 2018-08-10 LAB — CBC WITH DIFFERENTIAL (CANCER CENTER ONLY)
Abs Immature Granulocytes: 0.02 10*3/uL (ref 0.00–0.07)
Basophils Absolute: 0 10*3/uL (ref 0.0–0.1)
Basophils Relative: 1 %
Eosinophils Absolute: 0.1 10*3/uL (ref 0.0–0.5)
Eosinophils Relative: 1 %
HEMATOCRIT: 41.9 % (ref 36.0–46.0)
Hemoglobin: 13.9 g/dL (ref 12.0–15.0)
Immature Granulocytes: 0 %
LYMPHS ABS: 1.8 10*3/uL (ref 0.7–4.0)
Lymphocytes Relative: 24 %
MCH: 30.7 pg (ref 26.0–34.0)
MCHC: 33.2 g/dL (ref 30.0–36.0)
MCV: 92.5 fL (ref 80.0–100.0)
Monocytes Absolute: 0.4 10*3/uL (ref 0.1–1.0)
Monocytes Relative: 6 %
Neutro Abs: 5.3 10*3/uL (ref 1.7–7.7)
Neutrophils Relative %: 68 %
Platelet Count: 228 10*3/uL (ref 150–400)
RBC: 4.53 MIL/uL (ref 3.87–5.11)
RDW: 12.4 % (ref 11.5–15.5)
WBC Count: 7.7 10*3/uL (ref 4.0–10.5)
nRBC: 0 % (ref 0.0–0.2)

## 2018-08-10 LAB — LACTATE DEHYDROGENASE: LDH: 106 U/L (ref 98–192)

## 2018-08-10 MED ORDER — IOPAMIDOL (ISOVUE-300) INJECTION 61%
100.0000 mL | Freq: Once | INTRAVENOUS | Status: AC | PRN
  Administered 2018-08-10: 75 mL via INTRAVENOUS

## 2018-08-10 NOTE — Telephone Encounter (Signed)
Please set up PET scan next week and breast MRI next week. PET is scheduled and [facility] will contact patient to schedule MR per 2/7 los

## 2018-08-10 NOTE — Progress Notes (Signed)
.  Initial RN Navigator Patient Visit  Name: Martha Osborne Date of Referral : 08/02/2018 Diagnosis: Questionable Lobular Breast  Met with patient prior to their visit with MD. Hanley Seamen patient "Your Patient Navigator" handout which explains my role, areas in which I am able to help, and all the contact information for myself and the office. Also gave patient MD and Navigator business card. Reviewed with patient the general overview of expected course after initial diagnosis and time frame for all steps to be completed.  Patient completed visit with Dr. Marin Olp  Revisited with patient after MD visit. Patient will need  - referral to Dr Dalbert Batman for surgical resection - Dr Marin Olp will personally speak to Dr Dalbert Batman. Referral placed into [facility].  - CT chest - will be done this afternoon after her appointment  Patient understands all follow up procedures and expectations. They have my number to reach out for any further clarification or additional needs. Will call patient in 5-7 days to see if any further needs have presented, or if patient has any further questions or needs.

## 2018-08-10 NOTE — Progress Notes (Signed)
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 : I was found to have colon cancer  HPI: Martha Osborne is a very charming 68 year old white female.  She is originally from [place].  She and her husband have moved down to the [place] area back in 2001.  Her husband used to be in the [employer].  He worked in the department of veterans affairs after the [employer] was completed.  Martha Osborne used to work for [employer].  She has been getting routine mammograms.  She has been getting routine colonoscopies.  She woke up the day after New Year's with this pain in the right lower quadrant of her abdomen.  This was nonradiating.  It was not associated with vomiting.  There was no bleeding.  She had no urinary issues.  She had a CT scan done.  Surprisingly, this showed a thickening in the wall of the large bowel in the ascending portion of the bowel.  There was some slightly infiltrative soft tissue thickening at the ileocecal region.  The CT scan did not show any abnormalities elsewhere in the abdomen or lower chest.  She then underwent a colonoscopy.  This was done by Dr. Therisa Doyne.  This was done on 07/27/2018.  The pathology report ([id number]) showed metastatic lobular carcinoma consistent with a breast cancer.  The tumor was estrogen receptor positive.  She was then referred to the [facility] for an evaluation.  She has had no problems with nausea or vomiting.  There is been no weight loss or weight gain.  There is been no sweats.  She has had no rashes.  There is been no history of tobacco use.  There is no history of alcohol use.  There is no history of malignancy in the family.  Overall, her performance status is ECOG 1.   History reviewed. No pertinent past medical history.:  History reviewed. No pertinent surgical history.:   Current Outpatient Medications:  .  psyllium  (METAMUCIL) 58.6 % powder, Take 1 packet by mouth 3 (three) times daily., Disp: , Rfl:  .  Omega-3 Fatty Acids (OMEGA-3 FISH OIL PO), Omega 3 Fish Oil, Disp: , Rfl: :  :  Allergies  Allergen Reactions  . Other   :  Family History  Problem Relation Age of Onset  . Breast cancer Neg Hx   :  Social History   Socioeconomic History  . Marital status: Married    Spouse name: Not on file  . Number of children: Not on file  . Years of education: Not on file  . Highest education level: Not on file  Occupational History  . Not on file  Social Needs  . Financial resource strain: Not on file  . Food insecurity:    Worry: Not on file    Inability: Not on file  . Transportation needs:    Medical: Not on file    Non-medical: Not on file  Tobacco Use  . Smoking status: Never Smoker  . Smokeless tobacco: Never Used  Substance and Sexual Activity  . Alcohol use: Not on file  . Drug use: Not on file  . Sexual activity: Not on file  Lifestyle  . Physical activity:    Days per week: Not on file    Minutes per session: Not on file  . Stress: Not on file  Relationships  . Social connections:    Talks on  phone: Not on file    Gets together: Not on file    Attends religious service: Not on file    Active member of club or organization: Not on file    Attends meetings of clubs or organizations: Not on file    Relationship status: Not on file  . Intimate partner violence:    Fear of current or ex partner: Not on file    Emotionally abused: Not on file    Physically abused: Not on file    Forced sexual activity: Not on file  Other Topics Concern  . Not on file  Social History Narrative  . Not on file  :  Review of Systems  Constitutional: Negative.   HENT: Negative.   Eyes: Negative.   Respiratory: Negative.   Cardiovascular: Negative.   Gastrointestinal: Negative.   Genitourinary: Negative.   Musculoskeletal: Negative.   Skin: Negative.   Neurological: Negative.    Endo/Heme/Allergies: Negative.   Psychiatric/Behavioral: Negative.      Exam: Well-developed well-nourished white female in no obvious distress.  Vital signs show temperature of 98.6.  Pulse 74.  Blood pressure 138/59.  Weight is 137 pounds.  Head neck exam shows no ocular or oral lesions.  She has no palpable cervical or supraclavicular lymph nodes.  Lungs are clear bilaterally.  Cardiac exam regular rate and rhythm with a normal S1 and S2.  There are no murmurs, rubs or bruits.  Abdomen is soft.  She has good bowel sounds.  There is no fluid wave.  There is no palpable liver or spleen tip.  Breast exam shows no masses in either breast.  She has no swelling of the breast.  There is no nipple discharge bilaterally.  She has no bilateral axillary adenopathy.  Back exam shows no tenderness over the spine, ribs or hips.  Extremities shows no clubbing, cyanosis or edema.  Neurological exam shows no focal neurological deficits.  Skin exam shows no rashes, ecchymoses or petechia. @IPVITALS @   Recent Labs    08/10/18 1157  WBC 7.7  HGB 13.9  HCT 41.9  PLT 228   Recent Labs    08/10/18 1157  NA 136  K 4.2  CL 100  CO2 29  GLUCOSE 145*  BUN 15  CREATININE 0.85  CALCIUM 9.9    Blood smear review: None  Pathology: See above    Assessment and Plan: Martha Osborne is a very charming 68 year old white female.  She has an incredibly unusual situation.  I am really not sure how to explain it right now.  She absolutely has no obvious abnormalities with her breast.  We will do a MRI of her breast to see if we can pick up any abnormality that the mammogram cannot pick up.  I also will set her up with a PET scan.  Maybe, the PET scan can show [place] some disease that we cannot pick up with our typical radiologic studies.  It looks like her tumor does stain for estrogen.  As such, I would be able to utilize antiestrogen therapy.  I think that my recommendation right now is to have the tumor  removed.  I think we have to treat this as a "primary" colon cancer.  I think we have to resect the tumor.  We have to see if there are positive lymph nodes.  We need to try to render her "cancer free."  I spent about an hour with she and her husband.  They are both very very  nice.  They have a very good understanding of the situation.  I told her that this, by strict criteria, is stage IV breast cancer.  Again, by strict criteria, stage IV breast cancer is not curable but is definitely treatable.  I think that Martha Osborne is definitely in the minority of patients with stage IV cancer.  This would be what I would definitely consider as oligometastatic disease.  I will speak to the surgeon.  Hopefully, he will be able to see her and see about having surgery in the next couple weeks.  We will set up the MRI of the breast and the PET scan in the next week or so.  I will likely see her next after she has had her surgery.  Hopefully, we will be able to just get her on antiestrogen therapy.  I probably would consider her for Faslodex/ribociclib given the recent results of the phase 3 clinical trial as reported in the [place] [facility].  I answered all their questions.  I reassured her as much as I could.

## 2018-08-11 LAB — CANCER ANTIGEN 27.29: CA 27.29: 23.1 U/mL (ref 0.0–38.6)

## 2018-08-13 LAB — CEA (IN HOUSE-CHCC): CEA (CHCC-In House): 1.09 ng/mL (ref 0.00–5.00)

## 2018-08-15 ENCOUNTER — Telehealth: Payer: Self-pay | Admitting: Hematology & Oncology

## 2018-08-15 NOTE — Telephone Encounter (Signed)
Called patient to confirm with her the appointment for 2/17 PET Scan.  She is aware

## 2018-08-19 ENCOUNTER — Ambulatory Visit
Admission: RE | Admit: 2018-08-19 | Discharge: 2018-08-19 | Disposition: A | Discharge status: Home / Self Care | Payer: Medicare Other | Source: Ambulatory Visit | Attending: Hematology & Oncology | Admitting: Hematology & Oncology

## 2018-08-19 DIAGNOSIS — C50919 Malignant neoplasm of unspecified site of unspecified female breast: Secondary | ICD-10-CM | POA: Diagnosis not present

## 2018-08-19 DIAGNOSIS — C785 Secondary malignant neoplasm of large intestine and rectum: Secondary | ICD-10-CM | POA: Diagnosis not present

## 2018-08-19 MED ORDER — GADOBUTROL 1 MMOL/ML IV SOLN
6.0000 mL | Freq: Once | INTRAVENOUS | Status: AC | PRN
  Administered 2018-08-19: 6 mL via INTRAVENOUS

## 2018-08-20 ENCOUNTER — Other Ambulatory Visit: Payer: Self-pay | Admitting: Hematology & Oncology

## 2018-08-20 ENCOUNTER — Ambulatory Visit (HOSPITAL_COMMUNITY)
Admission: RE | Admit: 2018-08-20 | Discharge: 2018-08-20 | Disposition: A | Discharge status: Home / Self Care | Payer: Medicare Other | Source: Ambulatory Visit | Attending: Hematology & Oncology | Admitting: Hematology & Oncology

## 2018-08-20 DIAGNOSIS — C50919 Malignant neoplasm of unspecified site of unspecified female breast: Secondary | ICD-10-CM

## 2018-08-20 LAB — GLUCOSE, CAPILLARY: Glucose-Capillary: 91 mg/dL (ref 70–99)

## 2018-08-20 MED ORDER — FLUDEOXYGLUCOSE F - 18 (FDG) INJECTION
7.5500 | Freq: Once | INTRAVENOUS | Status: AC | PRN
  Administered 2018-08-20: 7.55 via INTRAVENOUS

## 2018-08-22 ENCOUNTER — Other Ambulatory Visit: Payer: Self-pay | Admitting: Hematology & Oncology

## 2018-08-22 ENCOUNTER — Encounter: Payer: Self-pay | Admitting: General Surgery

## 2018-08-23 ENCOUNTER — Other Ambulatory Visit: Payer: Self-pay | Admitting: Hematology & Oncology

## 2018-08-23 ENCOUNTER — Other Ambulatory Visit: Payer: Self-pay | Admitting: General Surgery

## 2018-08-23 DIAGNOSIS — C50919 Malignant neoplasm of unspecified site of unspecified female breast: Secondary | ICD-10-CM

## 2018-08-23 DIAGNOSIS — N6320 Unspecified lump in the left breast, unspecified quadrant: Secondary | ICD-10-CM | POA: Diagnosis not present

## 2018-08-23 DIAGNOSIS — N63 Unspecified lump in unspecified breast: Secondary | ICD-10-CM

## 2018-08-23 DIAGNOSIS — C785 Secondary malignant neoplasm of large intestine and rectum: Secondary | ICD-10-CM | POA: Diagnosis not present

## 2018-08-24 ENCOUNTER — Encounter: Payer: Self-pay | Admitting: *Deleted

## 2018-08-24 NOTE — Progress Notes (Signed)
Patient has seen Dr Dalbert Batman and a plan is in place for surgery on 09/14/2018. He has also scheduled scans and potential breast biopsy on 08/27/2018.  Patient is aware of plan. She has no questions or concerns at this time. She knows this office will reach out after her surgery and pathology results to determine the needed treatment plan. She understands and knows to call me with any concerns.

## 2018-08-27 ENCOUNTER — Other Ambulatory Visit: Payer: Self-pay | Admitting: General Surgery

## 2018-08-27 ENCOUNTER — Ambulatory Visit
Admission: RE | Admit: 2018-08-27 | Discharge: 2018-08-27 | Disposition: A | Discharge status: Home / Self Care | Payer: Medicare Other | Source: Ambulatory Visit | Attending: General Surgery | Admitting: General Surgery

## 2018-08-27 DIAGNOSIS — Z170 Estrogen receptor positive status [ER+]: Secondary | ICD-10-CM | POA: Diagnosis not present

## 2018-08-27 DIAGNOSIS — C50312 Malignant neoplasm of lower-inner quadrant of left female breast: Secondary | ICD-10-CM | POA: Diagnosis not present

## 2018-08-27 DIAGNOSIS — R928 Other abnormal and inconclusive findings on diagnostic imaging of breast: Secondary | ICD-10-CM

## 2018-08-27 DIAGNOSIS — N63 Unspecified lump in unspecified breast: Secondary | ICD-10-CM

## 2018-08-27 DIAGNOSIS — R922 Inconclusive mammogram: Secondary | ICD-10-CM | POA: Diagnosis not present

## 2018-08-27 DIAGNOSIS — N6324 Unspecified lump in the left breast, lower inner quadrant: Secondary | ICD-10-CM | POA: Diagnosis not present

## 2018-08-28 HISTORY — PX: BREAST BIOPSY: SHX20

## 2018-08-30 ENCOUNTER — Encounter: Payer: Self-pay | Admitting: *Deleted

## 2018-08-30 ENCOUNTER — Other Ambulatory Visit: Payer: Self-pay

## 2018-08-30 ENCOUNTER — Telehealth: Payer: Self-pay | Admitting: Hematology & Oncology

## 2018-08-30 ENCOUNTER — Encounter: Payer: Self-pay | Admitting: Hematology & Oncology

## 2018-08-30 ENCOUNTER — Inpatient Hospital Stay (HOSPITAL_BASED_OUTPATIENT_CLINIC_OR_DEPARTMENT_OTHER): Payer: Medicare Other | Admitting: Hematology & Oncology

## 2018-08-30 VITALS — BP 128/60 | HR 76 | Temp 98.7°F | Resp 16 | Wt 136.8 lb

## 2018-08-30 DIAGNOSIS — Z170 Estrogen receptor positive status [ER+]: Secondary | ICD-10-CM | POA: Diagnosis not present

## 2018-08-30 DIAGNOSIS — C50312 Malignant neoplasm of lower-inner quadrant of left female breast: Secondary | ICD-10-CM

## 2018-08-30 DIAGNOSIS — Z79899 Other long term (current) drug therapy: Secondary | ICD-10-CM | POA: Diagnosis not present

## 2018-08-30 DIAGNOSIS — Z7189 Other specified counseling: Secondary | ICD-10-CM | POA: Insufficient documentation

## 2018-08-30 DIAGNOSIS — C785 Secondary malignant neoplasm of large intestine and rectum: Secondary | ICD-10-CM | POA: Diagnosis not present

## 2018-08-30 DIAGNOSIS — Z79811 Long term (current) use of aromatase inhibitors: Secondary | ICD-10-CM | POA: Diagnosis not present

## 2018-08-30 DIAGNOSIS — C50911 Malignant neoplasm of unspecified site of right female breast: Secondary | ICD-10-CM

## 2018-08-30 HISTORY — DX: Other specified counseling: Z71.89

## 2018-08-30 HISTORY — DX: Malignant neoplasm of unspecified site of right female breast: C50.911

## 2018-08-30 NOTE — Telephone Encounter (Signed)
sch appts per 2/27 LOS. Mailed appt letter per pt request

## 2018-08-30 NOTE — Progress Notes (Signed)
Hematology and Oncology Follow Up Visit  LACARA DUNSWORTH [id number] 24-Jan-1951 68 y.o. 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 --  ER+/PR+/HER2-  Current Therapy:    Aromasin 25 mg po q day  Ribociclib 600 mg po q day (21 days on/7 days off) -- start on 10/03/2018     Interim History:  Ms. Mochizuki is back for an early visit.  We finally have identified where her cancer has originated from.  We did a MRI of her breasts.  The MRI show that in the left breast there is a 2.3 x 1.1 x 1.4 cm focal enhancement in the lower inner quadrant.  This was confirmed on a diagnostic mammogram.  She then underwent a biopsy.  This was done on 08/27/2018.  The pathology report ([id number]) showed an invasive lobular carcinoma.  It was ER positive/PR positive/HER-2 negative.  She has been seen by general surgery.  They will go ahead and do a partial colectomy to remove the met in the colon.  She had a PET scan done.  This was done on 08/20/2018.  The PET scan only showed the area in the wall of the ascending colon that was metabolic.  No other areas of metabolic activity were noted.  She had a normal CA 27.29 of 23.1.  At this point, we now know that there is metastatic breast cancer.  It is lobular.  I will get her started on antiestrogen therapy.  I will start her on Aromasin.  We will start at 25 mg p.o. daily.  I do think that she would also benefit from CDK4/CDK6 therapy.  I will use ribociclib.  I do not want to get her started on this before surgery.  I want to make sure that her blood counts are good to be okay for her surgery and to help with her recovery.  She is scheduled for surgery on March 13.  She is still very active.  She is doing Psychologist, occupational work.  She is exercising.  She is little bit annoyed that she has always doctors appointments and tests that need to be done right now.  Overall, I would say her performance  status is ECOG 0.   Medications:  Current Outpatient Medications:  .  metroNIDAZOLE (FLAGYL) 500 MG tablet, Take 500 mg by mouth 2 (two) times daily., Disp: , Rfl:  .  neomycin (MYCIFRADIN) 500 MG tablet, Take 500 mg by mouth., Disp: , Rfl:  .  Omega-3 Fatty Acids (OMEGA-3 FISH OIL) 1200 MG CAPS, Take 2,400 mg by mouth daily. , Disp: , Rfl:  .  psyllium (METAMUCIL) 58.6 % powder, Take 0.5 packets by mouth 2 (two) times daily. , Disp: , Rfl:   Allergies: No Known Allergies  Past Medical History, Surgical history, Social history, and Family History were reviewed and updated.  Review of Systems: Review of Systems  Constitutional: Negative.   HENT:  Negative.   Eyes: Negative.   Respiratory: Negative.   Cardiovascular: Negative.   Gastrointestinal: Negative.   Endocrine: Negative.   Genitourinary: Negative.    Musculoskeletal: Negative.   Skin: Negative.   Neurological: Negative.   Hematological: Negative.   Psychiatric/Behavioral: Negative.     Physical Exam:  weight is 136 lb 12 oz (62 kg). Her oral temperature is 98.7 F (37.1 C). Her blood pressure is 128/60 and her pulse is 76. Her respiration is 16 and oxygen saturation is 100%.   Wt Readings from Last  3 Encounters:  08/30/18 136 lb 12 oz (62 kg)  08/10/18 137 lb (62.1 kg)    Physical Exam Vitals signs reviewed.  HENT:     Head: Normocephalic and atraumatic.  Eyes:     Pupils: Pupils are equal, round, and reactive to light.  Neck:     Musculoskeletal: Normal range of motion.  Cardiovascular:     Rate and Rhythm: Normal rate and regular rhythm.     Heart sounds: Normal heart sounds.  Pulmonary:     Effort: Pulmonary effort is normal.     Breath sounds: Normal breath sounds.  Abdominal:     General: Bowel sounds are normal.     Palpations: Abdomen is soft.  Musculoskeletal: Normal range of motion.        General: No tenderness or deformity.  Lymphadenopathy:     Cervical: No cervical adenopathy.  Skin:     General: Skin is warm and dry.     Findings: No erythema or rash.  Neurological:     Mental Status: She is alert and oriented to person, place, and time.  Psychiatric:        Behavior: Behavior normal.        Thought Content: Thought content normal.        Judgment: Judgment normal.      Lab Results  Component Value Date   WBC 7.7 08/10/2018   HGB 13.9 08/10/2018   HCT 41.9 08/10/2018   MCV 92.5 08/10/2018   PLT 228 08/10/2018     Chemistry      Component Value Date/Time   NA 136 08/10/2018 1157   K 4.2 08/10/2018 1157   CL 100 08/10/2018 1157   CO2 29 08/10/2018 1157   BUN 15 08/10/2018 1157   CREATININE 0.85 08/10/2018 1157      Component Value Date/Time   CALCIUM 9.9 08/10/2018 1157   ALKPHOS 59 08/10/2018 1157   AST 15 08/10/2018 1157   ALT 9 08/10/2018 1157   BILITOT 0.4 08/10/2018 1157       Impression and Plan: Ms. Karp is a 68 year old white female.  She presented with a colonic mass.  This turned out to be metastatic breast cancer.  I must say that this is truly a very unusual presentation for metastatic disease.  Again, she is ER positive/HER-2 negative.  I think that she will do very well with antiestrogen therapy.  I think that the Aromasin getting started now is appropriate.  I think this will help get the tumors to respond.  I think once we start the ribociclib after her surgery, then she will definitely have a good response.  I talked to her and her husband for about 45 minutes.  All the time was spent face-to-face.  I answered all of their questions.  I explained the rationale for using antiestrogen therapy.  I told her that we are trying to avoid chemotherapy for as long as possible.  She knows that she has stage IV disease.  This is not curable but it clearly is highly treatable and our goal of care is definitely to improve and maintain her quality of life so she can partake in all of her activities that she likes to do.  I would like to see her  back about 3 weeks after surgery.  I will see her in the hospital when she has her surgery.     Volanda Napoleon, MD 2/27/202011:08 AM

## 2018-08-31 NOTE — Pre-Procedure Instructions (Signed)
LUISANA LUTZKE  08/31/2018    Your procedure is scheduled on Friday, September 14, 2018 at 9:15 AM.   Report to [facility] Entrance "A" Admitting Office at 7:15 AM.   Call this number if you have problems the morning of surgery: [phone]   Questions prior to day of surgery, please call [phone] between 8 & 4 PM.   Remember:  Do not eat food after midnight Thursday, 09/13/18.  You may drink clear liquids until 6:15 AM.  Clear liquids allowed are: Water, Juice (no pulp), Carbonated beverages, clear Tea, black Coffee, plain Jello, plain Popsicles, Gatorade.  Drink 2 of the Ensure drinks Thursday evening. The third drink is for Friday AM. Please drink it by 6:15 AM.                      Take these medicines the morning of surgery with A SIP OF WATER: None    Do not wear jewelry, make-up or nail polish.  Do not wear lotions, powders, perfumes or deodorant.  Do not shave 48 hours prior to surgery.    Do not bring valuables to the hospital.  [facility] is not responsible for any belongings or valuables.  Contacts, dentures or bridgework may not be worn into surgery.  Leave your suitcase in the car.  After surgery it may be brought to your room.  For patients admitted to the hospital, discharge time will be determined by your treatment team.  [facility] - Preparing for Surgery  Before surgery, you can play an important role.  Because skin is not sterile, your skin needs to be as free of germs as possible.  You can reduce the number of germs on you skin by washing with CHG (chlorahexidine gluconate) soap before surgery.  CHG is an antiseptic cleaner which kills germs and bonds with the skin to continue killing germs even after washing.  Oral Hygiene is also important in reducing the risk of infection.  Remember to brush your teeth with your regular toothpaste the morning of surgery.  Please DO NOT use if you have an allergy to CHG or antibacterial soaps.  If your skin becomes  reddened/irritated stop using the CHG and inform your nurse when you arrive at Short Stay.  Do not shave (including legs and underarms) for at least 48 hours prior to the first CHG shower.  You may shave your face.  Please follow these instructions carefully:   1.  Shower with CHG Soap the night before surgery and the morning of Surgery.  2.  If you choose to wash your hair, wash your hair first as usual with your normal shampoo.  3.  After you shampoo, rinse your hair and body thoroughly to remove the shampoo. 4.  Use CHG as you would any other liquid soap.  You can apply chg directly to the skin and wash gently with a      scrungie or washcloth.           5.  Apply the CHG Soap to your body ONLY FROM THE NECK DOWN.   Do not use on open wounds or open sores. Avoid contact with your eyes, ears, mouth and genitals (private parts).  Wash genitals (private parts) with your normal soap.  6.  Wash thoroughly, paying special attention to the area where your surgery will be performed.  7.  Thoroughly rinse your body with warm water from the neck down.  8.  DO NOT  shower/wash with your normal soap after using and rinsing off the CHG Soap.  9.  Pat yourself dry with a clean towel.            10.  Wear clean pajamas.            11.  Place clean sheets on your bed the night of your first shower and do not sleep with pets.  Day of Surgery  Shower as above. Do not apply any lotions/deodorants the morning of surgery.   Please wear clean clothes to the hospital. Remember to brush your teeth with toothpaste.   Please read over the fact sheets that you were given.

## 2018-09-03 ENCOUNTER — Encounter (HOSPITAL_COMMUNITY)
Admission: RE | Admit: 2018-09-03 | Discharge: 2018-09-03 | Disposition: A | Discharge status: Home / Self Care | Payer: Medicare Other | Source: Ambulatory Visit | Attending: General Surgery | Admitting: General Surgery

## 2018-09-03 ENCOUNTER — Other Ambulatory Visit: Payer: Self-pay

## 2018-09-03 ENCOUNTER — Encounter (HOSPITAL_COMMUNITY): Payer: Self-pay

## 2018-09-03 DIAGNOSIS — Z01812 Encounter for preprocedural laboratory examination: Secondary | ICD-10-CM | POA: Insufficient documentation

## 2018-09-03 DIAGNOSIS — C785 Secondary malignant neoplasm of large intestine and rectum: Secondary | ICD-10-CM | POA: Insufficient documentation

## 2018-09-03 HISTORY — DX: Adverse effect of unspecified anesthetic, initial encounter: T41.45XA

## 2018-09-03 HISTORY — DX: Unspecified malignant neoplasm of skin, unspecified: C44.90

## 2018-09-03 HISTORY — DX: Other complications of anesthesia, initial encounter: T88.59XA

## 2018-09-03 LAB — COMPREHENSIVE METABOLIC PANEL
ALBUMIN: 3.9 g/dL (ref 3.5–5.0)
ALT: 12 U/L (ref 0–44)
AST: 19 U/L (ref 15–41)
Alkaline Phosphatase: 62 U/L (ref 38–126)
Anion gap: 8 (ref 5–15)
BUN: 10 mg/dL (ref 8–23)
CO2: 25 mmol/L (ref 22–32)
Calcium: 9.3 mg/dL (ref 8.9–10.3)
Chloride: 103 mmol/L (ref 98–111)
Creatinine, Ser: 0.75 mg/dL (ref 0.44–1.00)
GFR calc Af Amer: >60 mL/min (ref >60)
GFR calc non Af Amer: >60 mL/min (ref >60)
GLUCOSE: 90 mg/dL (ref 70–99)
Potassium: 3.7 mmol/L (ref 3.5–5.1)
Sodium: 136 mmol/L (ref 135–145)
Total Bilirubin: 0.5 mg/dL (ref 0.3–1.2)
Total Protein: 6.8 g/dL (ref 6.5–8.1)

## 2018-09-03 LAB — CBC WITH DIFFERENTIAL/PLATELET
ABS IMMATURE GRANULOCYTES: 0.02 10*3/uL (ref 0.00–0.07)
Basophils Absolute: 0 10*3/uL (ref 0.0–0.1)
Basophils Relative: 1 %
Eosinophils Absolute: 0.1 10*3/uL (ref 0.0–0.5)
Eosinophils Relative: 1 %
HCT: 43.1 % (ref 36.0–46.0)
Hemoglobin: 13.9 g/dL (ref 12.0–15.0)
Immature Granulocytes: 0 %
LYMPHS PCT: 26 %
Lymphs Abs: 2.1 10*3/uL (ref 0.7–4.0)
MCH: 30 pg (ref 26.0–34.0)
MCHC: 32.3 g/dL (ref 30.0–36.0)
MCV: 92.9 fL (ref 80.0–100.0)
Monocytes Absolute: 0.5 10*3/uL (ref 0.1–1.0)
Monocytes Relative: 6 %
Neutro Abs: 5.3 10*3/uL (ref 1.7–7.7)
Neutrophils Relative %: 66 %
Platelets: 242 10*3/uL (ref 150–400)
RBC: 4.64 MIL/uL (ref 3.87–5.11)
RDW: 12.7 % (ref 11.5–15.5)
WBC: 8.1 10*3/uL (ref 4.0–10.5)
nRBC: 0 % (ref 0.0–0.2)

## 2018-09-03 LAB — HEMOGLOBIN A1C
Hgb A1c MFr Bld: 5.4 % (ref 4.8–5.6)
Mean Plasma Glucose: 108.28 mg/dL

## 2018-09-03 MED ORDER — LETROZOLE 2.5 MG PO TABS: 2.5000 mg | ORAL_TABLET | Freq: Every day | ORAL | 12 refills | Status: DC

## 2018-09-03 MED ORDER — EXEMESTANE 25 MG PO TABS: 25.0000 mg | ORAL_TABLET | Freq: Every day | ORAL | 4 refills | Status: DC

## 2018-09-03 NOTE — Addendum Note (Signed)
Addended by: Volanda Napoleon on: 09/03/2018 06:02 PM   Modules accepted: Orders

## 2018-09-03 NOTE — Addendum Note (Signed)
Addended by: Burney Gauze R on: 09/03/2018 09:25 AM   Modules accepted: Orders

## 2018-09-03 NOTE — Progress Notes (Signed)
PCP - Mat Carne, PA Cardiologist - N/a  Chest x-ray - n/a EKG - n/a Stress Test - denies  ECHO - denies Cardiac Cath - denies  Sleep Study - n/a CPAP -   Fasting Blood Sugar - n/a Checks Blood Sugar _____ times a day  Blood Thinner Instructions: n/a Aspirin Instructions: n/a  Anesthesia review: no  Patient denies shortness of breath, fever, cough and chest pain at PAT appointment   Patient verbalized understanding of instructions that were given to them at the PAT appointment. Patient was also instructed that they will need to review over the PAT instructions again at home before surgery.

## 2018-09-04 ENCOUNTER — Encounter: Payer: Self-pay | Admitting: *Deleted

## 2018-09-04 ENCOUNTER — Other Ambulatory Visit: Payer: Self-pay | Admitting: *Deleted

## 2018-09-04 MED ORDER — LETROZOLE 2.5 MG PO TABS: 2.5000 mg | ORAL_TABLET | Freq: Every day | ORAL | 12 refills | Status: DC

## 2018-09-04 MED FILL — LETROZOLE 2.5 MG TABS: 2.5 | 30 days supply | Qty: 30 | Fill #0

## 2018-09-07 NOTE — Progress Notes (Signed)
Medication list updated with Letrozole 2.5mg , per pt. Pt also made aware that the medicine can be taken on the day of surgery, per anesthesia.

## 2018-09-08 NOTE — H&P (Signed)
Martha Osborne  Location: [facility] Patient #: [id number] DOB: [birth year] Married / Language: English / Race: White Female       History of Present Illness The patient is a 68 year old female who presents with a complaint of invasive lobular carcinoma of colon and possible left breast mass. This is a healthy 68 year old female who was referred by Dr. Burney Gauze for surgical management of 2 separate cancers of the ascending colon. Dr. Therisa Doyne is her gastroenterologist. Mat Carne, PA provides primary care at [facility].      The patient is very healthy and has essentially no medical problems. On New Year's Day she awoke with sharp right lower quadrant pain. No nausea vomiting or change in her bowel habits. After this persisted for 36 hours she saw [facility] at [facility] and she went to the emergency room. CT scan of the abdomen and pelvis showed some thickening of the ileocecal area. On retrospective review I can see an apple core lesion at the hepatic flexure as well. There is no ascites. The liver looks normal.      Colonoscopy was performed on July 27, 2018. A malignant-appearing stenosis was noted at the hepatic flexure, biopsied and tattooed with [place] ink. There was a second infiltrative polypoid sessile mass in the ascending colon at the cecum and across the ileocecal valve. This was biopsied and also tattooed just distal to the tumor Pathology was performed by Mali Rund, MD independent surgical pathologist. This is stated to be invasive lobular carcinoma, ER positive. Numerous immunohistochemical stains were done.     She had diagnostic mammograms for 5 months ago which were reportedly normal. No prior breast problems. MRI breast performed on August 19, 2018 shows suspicious focal area of non-mass enhancement in the left breast lower inner quadrant. Right breast normal. No adenopathy. They suggested repeating mammograms and ultrasound and if that  was negative MRI guided biopsy. CT scan of the chest shows tiny lung nodules otherwise negative PET scan shows enhancement in the ascending colon but negative elsewhere specifically no enhancement in the breast. Lab work shows CA 27-29 and CEA normal.        Past history is negative. She has been healthy. Family history is negative for colon cancer breast cancer ovarian cancer pancreatic cancer Social history married. Retired. One son. Liver no drainage. Denies tobacco. Alcohol occasionally      We are very long discussion about the unusual nature of her disease presentation. She is aware that this appears to be breast cancer metastatic to the colon. I told her this could take more than one form. This could be localized disease which could be resected for cure. I told her we also might find multiple areas but there is no evidence for that yet. We have not yet clarified whether she has breast cancer but I encouraged her to go forward with evaluation and biopsy of the area and the left breast suggested by the MRI. We are going to set that up urgently, in hopes of completing the breast evaluation prior to the colon resection in a couple of weeks. \     The most compelling intervention, of course, is to proceed with colon resection. She and her husband agree and understand. She will be scheduled for laparoscopic versus open right colectomy in the near future. We will schedule this urgently. She will undergo mechanical and antibiotic bowel prep at home I've discussed the indications, details, techniques, and risk of  the surgery in detail. She is aware of the risk of bleeding, infection, conversion to major laparotomy, injury to adjacent organs, superficial infection, major intra-abdominal infection including anastomotic leak, injury to adjacent organs such as the ureter or duodenum, wound healing problems such as dehiscence or hernia. Cardiac pulmonary and thromboembolic complications. We went  over patient information booklet in detail and I drew pictures of her tumor location. She is aware that we may find carcinomatosis but hopefully not She is aware that decisions regarding chemotherapy and radiation therapy would be made after review of all of the colon and breast pathology She and her husband understand all these issues. All other questions are answered. They agree with this plan.    Past Surgical History  Colon Polyp Removal - Colonoscopy   Diagnostic Studies History  Colonoscopy  within last year Mammogram  within last year Pap Smear  1-5 years ago  Social History Alcohol use  Occasional alcohol use. No caffeine use  No drug use  Tobacco use  Never smoker.  Family History  Heart Disease  Father. Hypertension  Mother. Thyroid problems  Mother.  Pregnancy / Birth History  Age at menarche  83 years. Age of menopause  <45 Contraceptive History  Oral contraceptives. Gravida  1 Length (months) of breastfeeding  3-6 Maternal age  55-35 Para  1  Other Problems  Back Pain  Colon Cancer  Melanoma     Review of Systems  General Not Present- Appetite Loss, Chills, Fatigue, Fever, Night Sweats, Weight Gain and Weight Loss. Skin Not Present- Change in Wart/Mole, Dryness, Hives, Jaundice, New Lesions, Non-Healing Wounds, Rash and Ulcer. HEENT Present- Ringing in the Ears and Wears glasses/contact lenses. Not Present- Earache, Hearing Loss, Hoarseness, Nose Bleed, Oral Ulcers, Seasonal Allergies, Sinus Pain, Sore Throat, Visual Disturbances and Yellow Eyes. Respiratory Not Present- Bloody sputum, Chronic Cough, Difficulty Breathing, Snoring and Wheezing. Breast Not Present- Breast Mass, Breast Pain, Nipple Discharge and Skin Changes. Cardiovascular Not Present- Chest Pain, Difficulty Breathing Lying Down, Leg Cramps, Palpitations, Rapid Heart Rate, Shortness of Breath and Swelling of Extremities. Gastrointestinal Present- Abdominal Pain and  Bloating. Not Present- Bloody Stool, Change in Bowel Habits, Chronic diarrhea, Constipation, Difficulty Swallowing, Excessive gas, Gets full quickly at meals, Hemorrhoids, Indigestion, Nausea, Rectal Pain and Vomiting. Female Genitourinary Present- Urgency. Not Present- Frequency, Nocturia, Painful Urination and Pelvic Pain. Musculoskeletal Present- Joint Pain. Not Present- Back Pain, Joint Stiffness, Muscle Pain, Muscle Weakness and Swelling of Extremities. Neurological Not Present- Decreased Memory, Fainting, Headaches, Numbness, Seizures, Tingling, Tremor, Trouble walking and Weakness. Psychiatric Not Present- Anxiety, Bipolar, Change in Sleep Pattern, Depression, Fearful and Frequent crying. Endocrine Not Present- Cold Intolerance, Excessive Hunger, Hair Changes, Heat Intolerance, Hot flashes and New Diabetes. Hematology Not Present- Blood Thinners, Easy Bruising, Excessive bleeding, Gland problems, HIV and Persistent Infections.   Physical Exam General Mental Status-Alert. General Appearance-Consistent with stated age. Hydration-Well hydrated. Voice-Normal.  Head and Neck Head-normocephalic, atraumatic with no lesions or palpable masses. Trachea-midline. Thyroid Gland Characteristics - normal size and consistency.  Eye Eyeball - Bilateral-Extraocular movements intact. Sclera/Conjunctiva - Bilateral-No scleral icterus.  Chest and Lung Exam Chest and lung exam reveals -quiet, even and easy respiratory effort with no use of accessory muscles and on auscultation, normal breath sounds, no adventitious sounds and normal vocal resonance. Inspection Chest Wall - Normal. Back - normal.  Breast Note: Breasts are medium size. No clearly dominant mass but there is a little bit of lumpiness inferior to the left areolar margin. No skin  changes. No palpable axillary adenopathy.   Cardiovascular Cardiovascular examination reveals -normal heart sounds, regular rate and  rhythm with no murmurs and normal pedal pulses bilaterally.  Abdomen Inspection Inspection of the abdomen reveals - No Hernias. Skin - Scar - no surgical scars. Palpation/Percussion Palpation and Percussion of the abdomen reveal - Soft, Non Tender, No Rebound tenderness, No Rigidity (guarding) and No hepatosplenomegaly. Auscultation Auscultation of the abdomen reveals - Bowel sounds normal. Note: I tried multiple maneuvers but I do not feel a palpable mass.   Neurologic Neurologic evaluation reveals -alert and oriented x 3 with no impairment of recent or remote memory. Mental Status-Normal.  Musculoskeletal Normal Exam - Left-Upper Extremity Strength Normal and Lower Extremity Strength Normal. Normal Exam - Right-Upper Extremity Strength Normal and Lower Extremity Strength Normal.  Lymphatic Head & Neck  General Head & Neck Lymphatics: Bilateral - Description - Normal. Axillary  General Axillary Region: Bilateral - Description - Normal. Tenderness - Non Tender. Femoral & Inguinal  Generalized Femoral & Inguinal Lymphatics: Bilateral - Description - Normal. Tenderness - Non Tender.    Assessment & Plan SECONDARY MALIGNANCY OF ASCENDING COLON (C78.5)   You have been found to have 2 large cancers in your right colon I drew pictures of the location of these cancers Surprisingly, the biopsies reveal what looks like metastatic breast cancer Specifically this looks like invasive lobular carcinoma of the breast, estrogen receptor positive  Your PET scan lights up in the right colon, but really nowhere else in your body It is not clear how much cancer you have Hopefully the cancer is localized here:  Your breast MRI suggests an area of suspicious enhancement in the left breast, lower inner quadrant You'll be referred back for mammograms ultrasound and image guided biopsies of this area to try to clarify the diagnosis as to whether you do or do not have breast  cancer  The first priority in terms of surgery is to perform an operation to remove the diseased section of the colon you'll be scheduled for laparoscopic versus open resection of your right colon You will need to undergo mechanical and antibiotic bowel prep the day before surgery, but go on a liquid diet a full 48 hours prior to the surgery. My office will explain this bowel prep to you in detail, and you'll need to pick up the bowel prep and the antibiotics from the drugstore I have discussed the indications, techniques, and risk of the surgery with you and your husband in detail  Pt Education - [facility] Colon Bowel Prep 2018 ERAS/Miralax/Antibiotics Started Neomycin Sulfate 500 MG Oral Tablet, 2 (two) Tablet SEE NOTE, #6, 08/23/2018, No Refill. Local Order: TAKE TWO TABLETS AT 2 PM, 3 PM, AND 10 PM THE DAY PRIOR TO SURGERY Started Flagyl 500 MG Oral Tablet, 2 (two) Tablet SEE NOTE, #6, 08/23/2018, No Refill. Local Order: Take at 2pm, 3pm, and 10pm the day prior to your colon operation Pt Education - Pamphlet Given - Laparoscopic Colorectal Surgery: discussed with patient and provided information.  LEFT BREAST MASS (N63.20) .   Edsel Petrin. Dalbert Batman, M.D., [facility] General and Minimally invasive Surgery Breast and Colorectal Surgery Office:   [phone] Pager:   [phone]

## 2018-09-14 ENCOUNTER — Inpatient Hospital Stay (HOSPITAL_COMMUNITY): Payer: Medicare Other | Admitting: Anesthesiology

## 2018-09-14 ENCOUNTER — Encounter (HOSPITAL_COMMUNITY): Payer: Self-pay

## 2018-09-14 ENCOUNTER — Inpatient Hospital Stay (HOSPITAL_COMMUNITY)
Admission: RE | Admit: 2018-09-14 | Discharge: 2018-09-16 | DRG: 331 | Disposition: A | Discharge status: Home / Self Care | Payer: Medicare Other | Attending: General Surgery | Admitting: General Surgery

## 2018-09-14 ENCOUNTER — Encounter (HOSPITAL_COMMUNITY)
Admission: RE | Disposition: A | Discharge status: Home / Self Care | Payer: Self-pay | Source: Home / Self Care | Attending: General Surgery

## 2018-09-14 ENCOUNTER — Other Ambulatory Visit: Payer: Self-pay

## 2018-09-14 DIAGNOSIS — Z170 Estrogen receptor positive status [ER+]: Secondary | ICD-10-CM | POA: Diagnosis not present

## 2018-09-14 DIAGNOSIS — Z808 Family history of malignant neoplasm of other organs or systems: Secondary | ICD-10-CM | POA: Diagnosis not present

## 2018-09-14 DIAGNOSIS — C50919 Malignant neoplasm of unspecified site of unspecified female breast: Secondary | ICD-10-CM | POA: Diagnosis present

## 2018-09-14 DIAGNOSIS — Z800 Family history of malignant neoplasm of digestive organs: Secondary | ICD-10-CM

## 2018-09-14 DIAGNOSIS — C189 Malignant neoplasm of colon, unspecified: Secondary | ICD-10-CM | POA: Diagnosis not present

## 2018-09-14 DIAGNOSIS — C785 Secondary malignant neoplasm of large intestine and rectum: Principal | ICD-10-CM | POA: Diagnosis present

## 2018-09-14 DIAGNOSIS — C50912 Malignant neoplasm of unspecified site of left female breast: Secondary | ICD-10-CM | POA: Diagnosis present

## 2018-09-14 DIAGNOSIS — C50911 Malignant neoplasm of unspecified site of right female breast: Secondary | ICD-10-CM | POA: Diagnosis present

## 2018-09-14 DIAGNOSIS — C182 Malignant neoplasm of ascending colon: Secondary | ICD-10-CM | POA: Diagnosis not present

## 2018-09-14 DIAGNOSIS — Z8719 Personal history of other diseases of the digestive system: Secondary | ICD-10-CM

## 2018-09-14 DIAGNOSIS — Z8249 Family history of ischemic heart disease and other diseases of the circulatory system: Secondary | ICD-10-CM

## 2018-09-14 HISTORY — PX: COLON RESECTION: SHX5231

## 2018-09-14 HISTORY — PX: LAPAROSCOPIC RIGHT COLECTOMY: SHX5925

## 2018-09-14 LAB — CBC
HCT: 39.7 % (ref 36.0–46.0)
Hemoglobin: 13.4 g/dL (ref 12.0–15.0)
MCH: 30.3 pg (ref 26.0–34.0)
MCHC: 33.8 g/dL (ref 30.0–36.0)
MCV: 89.8 fL (ref 80.0–100.0)
Platelets: 205 10*3/uL (ref 150–400)
RBC: 4.42 MIL/uL (ref 3.87–5.11)
RDW: 12.2 % (ref 11.5–15.5)
WBC: 14.3 10*3/uL — ABNORMAL HIGH (ref 4.0–10.5)
nRBC: 0 % (ref 0.0–0.2)

## 2018-09-14 LAB — CREATININE, SERUM
Creatinine, Ser: 0.78 mg/dL (ref 0.44–1.00)
GFR calc Af Amer: >60 mL/min (ref >60)
GFR calc non Af Amer: >60 mL/min (ref >60)

## 2018-09-14 SURGERY — COLON RESECTION LAPAROSCOPIC
Anesthesia: General | Site: Abdomen | Laterality: Right

## 2018-09-14 MED ORDER — ALVIMOPAN 12 MG PO CAPS
12.0000 mg | ORAL_CAPSULE | ORAL | Status: AC
  Administered 2018-09-14: 12 mg via ORAL
  Filled 2018-09-14: qty 1

## 2018-09-14 MED ORDER — BUPIVACAINE-EPINEPHRINE (PF) 0.25% -1:200000 IJ SOLN
INTRAMUSCULAR | Status: AC
  Filled 2018-09-14: qty 30

## 2018-09-14 MED ORDER — LIDOCAINE 2% (20 MG/ML) 5 ML SYRINGE
INTRAMUSCULAR | Status: AC
  Filled 2018-09-14: qty 5

## 2018-09-14 MED ORDER — ROCURONIUM BROMIDE 50 MG/5ML IV SOSY
PREFILLED_SYRINGE | INTRAVENOUS | Status: DC | PRN
  Administered 2018-09-14: 50 mg via INTRAVENOUS
  Administered 2018-09-14: 20 mg via INTRAVENOUS
  Administered 2018-09-14: 10 mg via INTRAVENOUS

## 2018-09-14 MED ORDER — SUGAMMADEX SODIUM 200 MG/2ML IV SOLN
INTRAVENOUS | Status: DC | PRN
  Administered 2018-09-14: 200 mg via INTRAVENOUS

## 2018-09-14 MED ORDER — ROCURONIUM BROMIDE 50 MG/5ML IV SOSY
PREFILLED_SYRINGE | INTRAVENOUS | Status: AC
  Filled 2018-09-14: qty 5

## 2018-09-14 MED ORDER — SODIUM CHLORIDE 0.9 % IR SOLN
Status: DC | PRN
  Administered 2018-09-14: 1

## 2018-09-14 MED ORDER — LIDOCAINE 2% (20 MG/ML) 5 ML SYRINGE
INTRAMUSCULAR | Status: DC | PRN
  Administered 2018-09-14: 100 mg via INTRAVENOUS

## 2018-09-14 MED ORDER — PROPOFOL 10 MG/ML IV BOLUS
INTRAVENOUS | Status: AC
  Filled 2018-09-14: qty 20

## 2018-09-14 MED ORDER — CEFOTETAN DISODIUM-DEXTROSE 2-2.08 GM-%(50ML) IV SOLR
2.0000 g | INTRAVENOUS | Status: AC
  Administered 2018-09-14: 2 g via INTRAVENOUS
  Filled 2018-09-14: qty 50

## 2018-09-14 MED ORDER — ENSURE SURGERY PO LIQD
237.0000 mL | Freq: Two times a day (BID) | ORAL | Status: DC
  Administered 2018-09-14 – 2018-09-16 (×4): 237 mL via ORAL
  Filled 2018-09-14 (×5): qty 237

## 2018-09-14 MED ORDER — KETOROLAC TROMETHAMINE 30 MG/ML IJ SOLN: 30.0000 mg | Freq: Once | INTRAMUSCULAR | Status: DC | PRN

## 2018-09-14 MED ORDER — CHLORHEXIDINE GLUCONATE 4 % EX LIQD: 60.0000 mL | Freq: Once | CUTANEOUS | Status: DC

## 2018-09-14 MED ORDER — TRAMADOL HCL 50 MG PO TABS: 50.0000 mg | ORAL_TABLET | Freq: Four times a day (QID) | ORAL | Status: DC | PRN

## 2018-09-14 MED ORDER — ONDANSETRON HCL 4 MG PO TABS: 4.0000 mg | ORAL_TABLET | Freq: Four times a day (QID) | ORAL | Status: DC | PRN

## 2018-09-14 MED ORDER — ALVIMOPAN 12 MG PO CAPS
12.0000 mg | ORAL_CAPSULE | Freq: Two times a day (BID) | ORAL | Status: DC
  Administered 2018-09-15: 12 mg via ORAL
  Filled 2018-09-14: qty 1

## 2018-09-14 MED ORDER — SODIUM CHLORIDE 0.9 % IV SOLN
2.0000 g | Freq: Two times a day (BID) | INTRAVENOUS | Status: AC
  Administered 2018-09-14: 2 g via INTRAVENOUS
  Filled 2018-09-14: qty 2

## 2018-09-14 MED ORDER — ONDANSETRON HCL 4 MG/2ML IJ SOLN
INTRAMUSCULAR | Status: AC
  Filled 2018-09-14: qty 2

## 2018-09-14 MED ORDER — LETROZOLE 2.5 MG PO TABS: 2.5000 mg | ORAL_TABLET | Freq: Every day | ORAL | Status: DC

## 2018-09-14 MED ORDER — FENTANYL CITRATE (PF) 100 MCG/2ML IJ SOLN
25.0000 ug | INTRAMUSCULAR | Status: DC | PRN
  Administered 2018-09-14 (×3): 50 ug via INTRAVENOUS

## 2018-09-14 MED ORDER — DEXAMETHASONE SODIUM PHOSPHATE 10 MG/ML IJ SOLN
INTRAMUSCULAR | Status: DC | PRN
  Administered 2018-09-14: 5 mg via INTRAVENOUS

## 2018-09-14 MED ORDER — HYDROCODONE-ACETAMINOPHEN 5-325 MG PO TABS
1.0000 | ORAL_TABLET | ORAL | Status: DC | PRN
  Administered 2018-09-14: 1 via ORAL
  Administered 2018-09-15: 2 via ORAL
  Filled 2018-09-14 (×2): qty 2
  Filled 2018-09-14: qty 1

## 2018-09-14 MED ORDER — CELECOXIB 200 MG PO CAPS
200.0000 mg | ORAL_CAPSULE | Freq: Two times a day (BID) | ORAL | Status: DC
  Administered 2018-09-14 – 2018-09-16 (×5): 200 mg via ORAL
  Filled 2018-09-14 (×5): qty 1

## 2018-09-14 MED ORDER — MEPERIDINE HCL 50 MG/ML IJ SOLN: 6.2500 mg | INTRAMUSCULAR | Status: DC | PRN

## 2018-09-14 MED ORDER — ENOXAPARIN SODIUM 40 MG/0.4ML ~~LOC~~ SOLN
40.0000 mg | SUBCUTANEOUS | Status: DC
  Administered 2018-09-15 – 2018-09-16 (×2): 40 mg via SUBCUTANEOUS
  Filled 2018-09-14 (×2): qty 0.4

## 2018-09-14 MED ORDER — ONDANSETRON HCL 4 MG/2ML IJ SOLN
INTRAMUSCULAR | Status: DC | PRN
  Administered 2018-09-14: 4 mg via INTRAVENOUS

## 2018-09-14 MED ORDER — CELECOXIB 200 MG PO CAPS
200.0000 mg | ORAL_CAPSULE | ORAL | Status: AC
  Administered 2018-09-14: 200 mg via ORAL
  Filled 2018-09-14: qty 1

## 2018-09-14 MED ORDER — ONDANSETRON HCL 4 MG/2ML IJ SOLN: 4.0000 mg | Freq: Four times a day (QID) | INTRAMUSCULAR | Status: DC | PRN

## 2018-09-14 MED ORDER — BUPIVACAINE-EPINEPHRINE 0.5% -1:200000 IJ SOLN
INTRAMUSCULAR | Status: DC | PRN
  Administered 2018-09-14: 10 mL

## 2018-09-14 MED ORDER — SODIUM CHLORIDE 0.9 % IV SOLN
INTRAVENOUS | Status: DC | PRN
  Administered 2018-09-14: 20 ug/min via INTRAVENOUS

## 2018-09-14 MED ORDER — LACTATED RINGERS IV SOLN
INTRAVENOUS | Status: DC
  Administered 2018-09-14 (×2): via INTRAVENOUS

## 2018-09-14 MED ORDER — FENTANYL CITRATE (PF) 250 MCG/5ML IJ SOLN
INTRAMUSCULAR | Status: AC
  Filled 2018-09-14: qty 5

## 2018-09-14 MED ORDER — KETOROLAC TROMETHAMINE 15 MG/ML IJ SOLN
INTRAMUSCULAR | Status: AC
  Filled 2018-09-14: qty 1

## 2018-09-14 MED ORDER — PROPOFOL 10 MG/ML IV BOLUS
INTRAVENOUS | Status: DC | PRN
  Administered 2018-09-14: 120 mg via INTRAVENOUS

## 2018-09-14 MED ORDER — HYDROMORPHONE HCL 1 MG/ML IJ SOLN
1.0000 mg | INTRAMUSCULAR | Status: DC | PRN
  Administered 2018-09-14: 1 mg via INTRAVENOUS
  Filled 2018-09-14: qty 1

## 2018-09-14 MED ORDER — MIDAZOLAM HCL 5 MG/5ML IJ SOLN
INTRAMUSCULAR | Status: DC | PRN
  Administered 2018-09-14 (×2): 1 mg via INTRAVENOUS

## 2018-09-14 MED ORDER — LETROZOLE 2.5 MG PO TABS
2.5000 mg | ORAL_TABLET | Freq: Every day | ORAL | Status: DC
  Administered 2018-09-14 – 2018-09-16 (×3): 2.5 mg via ORAL
  Filled 2018-09-14 (×3): qty 1

## 2018-09-14 MED ORDER — FENTANYL CITRATE (PF) 100 MCG/2ML IJ SOLN
INTRAMUSCULAR | Status: AC
  Filled 2018-09-14: qty 2

## 2018-09-14 MED ORDER — ACETAMINOPHEN 500 MG PO TABS
1000.0000 mg | ORAL_TABLET | ORAL | Status: AC
  Administered 2018-09-14: 1000 mg via ORAL
  Filled 2018-09-14: qty 2

## 2018-09-14 MED ORDER — GABAPENTIN 300 MG PO CAPS
300.0000 mg | ORAL_CAPSULE | ORAL | Status: AC
  Administered 2018-09-14: 300 mg via ORAL
  Filled 2018-09-14: qty 1

## 2018-09-14 MED ORDER — FENTANYL CITRATE (PF) 100 MCG/2ML IJ SOLN
INTRAMUSCULAR | Status: DC | PRN
  Administered 2018-09-14: 150 ug via INTRAVENOUS
  Administered 2018-09-14 (×2): 50 ug via INTRAVENOUS

## 2018-09-14 MED ORDER — ONDANSETRON HCL 4 MG/2ML IJ SOLN: 4.0000 mg | Freq: Once | INTRAMUSCULAR | Status: DC | PRN

## 2018-09-14 MED ORDER — FENTANYL CITRATE (PF) 100 MCG/2ML IJ SOLN
INTRAMUSCULAR | Status: AC
  Administered 2018-09-14: 50 ug via INTRAVENOUS
  Filled 2018-09-14: qty 2

## 2018-09-14 MED ORDER — GABAPENTIN 300 MG PO CAPS
300.0000 mg | ORAL_CAPSULE | Freq: Two times a day (BID) | ORAL | Status: DC
  Administered 2018-09-14 – 2018-09-16 (×5): 300 mg via ORAL
  Filled 2018-09-14 (×5): qty 1

## 2018-09-14 MED ORDER — KETOROLAC TROMETHAMINE 15 MG/ML IJ SOLN
15.0000 mg | Freq: Once | INTRAMUSCULAR | Status: AC
  Administered 2018-09-14: 15 mg via INTRAVENOUS

## 2018-09-14 MED ORDER — MIDAZOLAM HCL 2 MG/2ML IJ SOLN
INTRAMUSCULAR | Status: AC
  Filled 2018-09-14: qty 2

## 2018-09-14 SURGICAL SUPPLY — 71 items
APPLIER CLIP ROT 10 11.4 M/L (STAPLE)
APR CLP MED LRG 11.4X10 (STAPLE)
BLADE CLIPPER SURG (BLADE) ×3 IMPLANT
CANISTER SUCT 3000ML PPV (MISCELLANEOUS) ×4 IMPLANT
CELLS DAT CNTRL 66122 CELL SVR (MISCELLANEOUS) IMPLANT
CLIP APPLIE ROT 10 11.4 M/L (STAPLE) IMPLANT
COVER SURGICAL LIGHT HANDLE (MISCELLANEOUS) ×8 IMPLANT
COVER WAND RF STERILE (DRAPES) ×1 IMPLANT
DRAPE WARM FLUID 44X44 (DRAPE) ×4 IMPLANT
DRSG OPSITE POSTOP 4X10 (GAUZE/BANDAGES/DRESSINGS) IMPLANT
DRSG OPSITE POSTOP 4X8 (GAUZE/BANDAGES/DRESSINGS) ×3 IMPLANT
ELECT CAUTERY BLADE 6.4 (BLADE) ×8 IMPLANT
ELECT REM PT RETURN 9FT ADLT (ELECTROSURGICAL) ×4
ELECTRODE REM PT RTRN 9FT ADLT (ELECTROSURGICAL) ×2 IMPLANT
GEL ULTRASOUND 20GR AQUASONIC (MISCELLANEOUS) IMPLANT
GLOVE BIOGEL PI IND STRL 6.5 (GLOVE) ×2 IMPLANT
GLOVE BIOGEL PI IND STRL 7.0 (GLOVE) ×1 IMPLANT
GLOVE BIOGEL PI IND STRL 7.5 (GLOVE) ×1 IMPLANT
GLOVE BIOGEL PI INDICATOR 6.5 (GLOVE) ×4
GLOVE BIOGEL PI INDICATOR 7.0 (GLOVE) ×2
GLOVE BIOGEL PI INDICATOR 7.5 (GLOVE) ×2
GLOVE EUDERMIC 7 POWDERFREE (GLOVE) ×8 IMPLANT
GLOVE SURG SS PI 6.5 STRL IVOR (GLOVE) ×6 IMPLANT
GLOVE SURG SS PI 7.5 STRL IVOR (GLOVE) ×9 IMPLANT
GOWN STRL REUS W/ TWL LRG LVL3 (GOWN DISPOSABLE) ×9 IMPLANT
GOWN STRL REUS W/ TWL XL LVL3 (GOWN DISPOSABLE) ×6 IMPLANT
GOWN STRL REUS W/TWL LRG LVL3 (GOWN DISPOSABLE) ×12
GOWN STRL REUS W/TWL XL LVL3 (GOWN DISPOSABLE) ×16
KIT TURNOVER KIT B (KITS) ×4 IMPLANT
LIGASURE IMPACT 36 18CM CVD LR (INSTRUMENTS) IMPLANT
NS IRRIG 1000ML POUR BTL (IV SOLUTION) ×8 IMPLANT
PACK COLON (CUSTOM PROCEDURE TRAY) ×4 IMPLANT
PAD ARMBOARD 7.5X6 YLW CONV (MISCELLANEOUS) ×8 IMPLANT
PENCIL BUTTON HOLSTER BLD 10FT (ELECTRODE) ×2 IMPLANT
RELOAD PROXIMATE 75MM BLUE (ENDOMECHANICALS) ×12 IMPLANT
RELOAD STAPLE 75 3.8 BLU REG (ENDOMECHANICALS) IMPLANT
RETRACTOR WND ALEXIS 18 MED (MISCELLANEOUS) IMPLANT
RTRCTR WOUND ALEXIS 18CM MED (MISCELLANEOUS)
SCISSORS LAP 5X35 DISP (ENDOMECHANICALS) ×4 IMPLANT
SET IRRIG TUBING LAPAROSCOPIC (IRRIGATION / IRRIGATOR) IMPLANT
SET TUBE SMOKE EVAC HIGH FLOW (TUBING) ×4 IMPLANT
SHEARS HARMONIC ACE PLUS 36CM (ENDOMECHANICALS) ×4 IMPLANT
SLEEVE ENDOPATH XCEL 5M (ENDOMECHANICALS) ×7 IMPLANT
SPECIMEN JAR LARGE (MISCELLANEOUS) ×1 IMPLANT
SPECIMEN JAR X LARGE (MISCELLANEOUS) ×1 IMPLANT
SPONGE LAP 18X18 RF (DISPOSABLE) IMPLANT
STAPLER GUN LINEAR PROX 60 (STAPLE) ×3 IMPLANT
STAPLER PROXIMATE 75MM BLUE (STAPLE) ×3 IMPLANT
STAPLER VISISTAT 35W (STAPLE) ×1 IMPLANT
SURGILUBE 2OZ TUBE FLIPTOP (MISCELLANEOUS) IMPLANT
SUT PDS AB 1 TP1 54 (SUTURE) IMPLANT
SUT PDS AB 1 TP1 96 (SUTURE) IMPLANT
SUT PROLENE 2 0 CT2 30 (SUTURE) IMPLANT
SUT PROLENE 2 0 KS (SUTURE) IMPLANT
SUT SILK 2 0 SH CR/8 (SUTURE) ×4 IMPLANT
SUT SILK 2 0 TIES 10X30 (SUTURE) ×4 IMPLANT
SUT SILK 3 0 SH CR/8 (SUTURE) ×4 IMPLANT
SUT SILK 3 0 TIES 10X30 (SUTURE) ×4 IMPLANT
SUT VIC AB 3-0 SH 18 (SUTURE) IMPLANT
SYS LAPSCP GELPORT 120MM (MISCELLANEOUS)
SYSTEM LAPSCP GELPORT 120MM (MISCELLANEOUS) IMPLANT
TRAY FOLEY MTR SLVR 14FR STAT (SET/KITS/TRAYS/PACK) IMPLANT
TRAY FOLEY MTR SLVR 16FR STAT (SET/KITS/TRAYS/PACK) ×4 IMPLANT
TRAY PROCTOSCOPIC FIBER OPTIC (SET/KITS/TRAYS/PACK) IMPLANT
TROCAR BALLN 12MMX100 BLUNT (TROCAR) IMPLANT
TROCAR XCEL BLUNT TIP 100MML (ENDOMECHANICALS) IMPLANT
TROCAR XCEL NON-BLD 11X100MML (ENDOMECHANICALS) IMPLANT
TROCAR XCEL NON-BLD 5MMX100MML (ENDOMECHANICALS) ×4 IMPLANT
TUBE CONNECTING 12'X1/4 (SUCTIONS) ×2
TUBE CONNECTING 12X1/4 (SUCTIONS) ×6 IMPLANT
WATER STERILE IRR 1000ML POUR (IV SOLUTION) ×4 IMPLANT

## 2018-09-14 NOTE — Anesthesia Preprocedure Evaluation (Addendum)
Anesthesia Evaluation  Patient identified by MRN, date of birth, ID band Patient awake    Reviewed: Allergy & Precautions, NPO status , Patient's Chart, lab work & pertinent test results  History of Anesthesia Complications (+) PONV and history of anesthetic complications  Airway Mallampati: I       Dental no notable dental hx. (+) Teeth Intact, Dental Advisory Given   Pulmonary    Pulmonary exam normal        Cardiovascular Normal cardiovascular exam Rhythm:Regular Rate:Normal     Neuro/Psych negative neurological ROS     GI/Hepatic negative GI ROS, Neg liver ROS,   Endo/Other  negative endocrine ROS  Renal/GU negative Renal ROS  negative genitourinary   Musculoskeletal   Abdominal Normal abdominal exam  (+)   Peds  Hematology negative hematology ROS (+)   Anesthesia Other Findings   Reproductive/Obstetrics                            Anesthesia Physical Anesthesia Plan  ASA: II  Anesthesia Plan: General   Post-op Pain Management:    Induction: Intravenous  PONV Risk Score and Plan: 4 or greater and Ondansetron, Dexamethasone and Midazolam  Airway Management Planned: Oral ETT  Additional Equipment:   Intra-op Plan:   Post-operative Plan: Extubation in OR  Informed Consent: I have reviewed the patients History and Physical, chart, labs and discussed the procedure including the risks, benefits and alternatives for the proposed anesthesia with the patient or authorized representative who has indicated his/her understanding and acceptance.     Dental advisory given  Plan Discussed with: CRNA  Anesthesia Plan Comments:         Anesthesia Quick Evaluation

## 2018-09-14 NOTE — Transfer of Care (Signed)
Immediate Anesthesia Transfer of Care Note  Patient: Martha Osborne  Procedure(s) Performed: LAPAROSCOPIC  ASSISTED RIGHT COLECTOMY (Right Abdomen)  Patient Location: PACU  Anesthesia Type:General  Level of Consciousness: awake, alert  and oriented  Airway & Oxygen Therapy: Patient Spontanous Breathing and Patient connected to nasal cannula oxygen  Post-op Assessment: Report given to RN, Post -op Vital signs reviewed and stable and Patient moving all extremities X 4  Post vital signs: Reviewed and stable  Last Vitals:  Vitals Value Taken Time  BP 106/42 09/14/2018 11:32 AM  Temp    Pulse 47 09/14/2018 11:37 AM  Resp 5 09/14/2018 11:37 AM  SpO2 100 % 09/14/2018 11:37 AM  Vitals shown include unvalidated device data.  Last Pain:  Vitals:   09/14/18 0759  TempSrc:   PainSc: 0-No pain         Complications: No apparent anesthesia complications

## 2018-09-14 NOTE — Interval H&P Note (Signed)
History and Physical Interval Note:  09/14/2018 9:09 AM  Martha Osborne  has presented today for surgery, with the diagnosis of cancer right colon.  The various methods of treatment have been discussed with the patient and family. After consideration of risks, benefits and other options for treatment, the patient has consented to  Procedure(s): LAPAROSCOPIC VERSUS OPEN RIGHT COLECTOMY (Right) OPEN RIGHT COLECTOMY (Right) as a surgical intervention.  The patient's history has been reviewed, patient examined, no change in status, stable for surgery.  I have reviewed the patient's chart and labs.  Questions were answered to the patient's satisfaction.     Adin Hector

## 2018-09-14 NOTE — Op Note (Signed)
Patient Name:           Martha Osborne   Date of Surgery:        09/14/2018  Pre op Diagnosis:      Secondary malignancy of ascending colon                                       Invasive lobular carcinoma left breast, estrogen receptor positive  Post op Diagnosis:    Same  Procedure:                 Laparoscopic-assisted ileocolectomy primary anastomosis  Surgeon:                     Edsel Petrin. Dalbert Batman, M.D., FACS  Assistant:                      Dr. Ralene Ok  Operative Indications:   The patient is a 68 year old female who presents with a complaint of invasive lobular carcinoma of colon and possible left breast mass. This is a healthy 68 year old female who was referred by Dr. Burney Gauze for surgical management of 2 separate cancers of the ascending colon. Dr. Therisa Doyne is her gastroenterologist. Mat Carne, PA provides primary care at [facility].      The patient is very healthy and has essentially no medical problems. On New Year's Day she awoke with sharp right lower quadrant pain. No nausea vomiting or change in her bowel habits. After this persisted for 36 hours she saw [facility] at [facility] and she went to the emergency room. CT scan of the abdomen and pelvis showed some thickening of the ileocecal area. On retrospective review I can see an apple core lesion at the hepatic flexure as well. There is no ascites. The liver looks normal.      Colonoscopy was performed on July 27, 2018. A malignant-appearing stenosis was noted at the hepatic flexure, biopsied and tattooed with [place] ink. There was a second infiltrative polypoid sessile mass in the ascending colon at the cecum and across the ileocecal valve. This was biopsied and also tattooed just distal to the tumor Pathology was performed by Mali Rund, MD independent surgical pathologist. This is stated to be invasive lobular carcinoma, ER positive. Numerous immunohistochemical stains were done.     She had  diagnostic mammograms for 5 months ago which were reportedly normal. No prior breast problems. MRI breast performed on August 19, 2018 shows suspicious focal area of non-mass enhancement in the left breast lower inner quadrant. Right breast normal. No adenopathy. They suggested repeating mammograms and ultrasound and if that was negative MRI guided biopsy. CT scan of the chest shows tiny lung nodules otherwise negative PET scan shows enhancement in the ascending colon but negative elsewhere specifically no enhancement in the breast. Lab work shows CA 27-29 and CEA normal. She is aware that this appears to be breast cancer metastatic to the colon. I told her this could take more than one form. This could be localized disease which could be resected for cure. I told her we also might find multiple areas but there is no evidence for that yet. She has undergone image guided biopsy of the left breast and that also confirms invasive lobular carcinoma.. \     The most compelling intervention, of course, is to proceed with colon resection.  She and her husband agree and understand. She will be scheduled for laparoscopic versus open right colectomy in the near future. We will schedule this urgently.    She has been started on aromatase inhibitors  Operative Findings:       There were 2 palpable masses in the colon, one at the ileocecal valve and one at the hepatic flexure, essentially consistent with the colonoscopic findings.  I found no other evidence of disease anywhere.  The liver was visually and palpably normal.  The omentum and small bowel felt normal.  Peritoneal surfaces showed no evidence of any spots or nodules.  Procedure in Detail:          Following the induction of general endotracheal anesthesia a Foley catheter was placed.  Surgical timeout was performed.  The abdomen was prepped and draped in sterile fashion.  Intravenous antibiotics were given.  0.25% Marcaine with epinephrine was  used as a local infiltration anesthetic.      Hassan trocar was placed in the midline just above the umbilicus.  Pneumoperitoneum was created.  Video camera was inserted.  A 5 mm trochar placed in the upper midline, 5 mm in the lower midline, 5 mm trocar in the right lower quadrant.  We mobilized the terminal ileum, cecum, ascending colon, and transverse colon by dividing the peritoneal attachments and mobilizing slowly.  We had good visualization of the duodenum and gallbladder throughout.  The hepatic flexure was folded onto itself by chronic omental adhesions and these had to be taken down and clarified.  We used the harmonic scalpel and blunt dissection for most of this.  Once we had complete mobilization we made a midline incision about 8 or 9 cm in length, extending a little bit above and little bit below the umbilicus.  The terminal ileum and right colon were easily delivered into the wound.  The transverse colon was divided with a GIA stapling device just to the right of the middle colic vessels.  The terminal ileum was divided with a GIA stapling device.  I probably resected about 15 cm of ileum.  I had about 7 cm of the transverse colon distal to the tumor.  Mesentery was taken down as far posteriorly as possible.  Smaller vessels were controlled with the LigaSure and larger vessels were clamped divided and ligated with 2-0 silk ties.  The specimen was sent to the lab.  Hemostasis was excellent.  The abdomen was irrigated.  Anastomosis was created between the terminal ileum and the mid transverse colon with a GIA stapling device.  The common defect was closed with a TA 60 stapling device.  Further sutures were placed to reinforce the staple lines at critical points under control staple line bleeding.  The anastomosis was widely patent.  The colon was pink and healthy.  There was no tension.  We dropped the anastomosis back into the abdomen and covered it with the omentum.        At this point the change  in case we changed over all of our gowns gloves instruments and redraped the field.  We irrigated 1 more time.  There was no bleeding.  The midline fascia was closed with a running suture of #1 PDS.  All the skin incisions were closed with staples.  We reinsufflated and looked around and there was no bleeding.  Just a little bit of irrigation fluid which we removed.  The pneumoperitoneum was released.  The trochars were removed.  The trocar sites were  closed with staples.      Patient tolerated the procedure well was taken to PACU in stable condition.  EBL 50 to 75 cc.  Counts correct.  Complications none.     Edsel Petrin. Dalbert Batman, M.D., FACS General and Minimally Invasive Surgery Breast and Colorectal Surgery  09/14/2018 11:23 AM

## 2018-09-14 NOTE — Progress Notes (Signed)
Pt new admit from PACU s/p colectomy with mid abdominal wound with honey comb and lap sites, alert and oriented, room air, with right hand peripheral IV line, with foley cath, no complain of pain at this time, on clear liquid, will continue to monitor.

## 2018-09-14 NOTE — Anesthesia Procedure Notes (Signed)
Procedure Name: Intubation Date/Time: 09/14/2018 9:24 AM Performed by: Kyung Rudd, CRNA Pre-anesthesia Checklist: Patient identified, Emergency Drugs available, Suction available and Patient being monitored Patient Re-evaluated:Patient Re-evaluated prior to induction Oxygen Delivery Method: Circle system utilized Preoxygenation: Pre-oxygenation with 100% oxygen Induction Type: IV induction Ventilation: Mask ventilation without difficulty Laryngoscope Size: Mac and 3 Grade View: Grade I Tube type: Oral Tube size: 7.0 mm Number of attempts: 1 Airway Equipment and Method: Stylet Placement Confirmation: ETT inserted through vocal cords under direct vision,  positive ETCO2 and breath sounds checked- equal and bilateral Secured at: 20 cm Tube secured with: Tape Dental Injury: Teeth and Oropharynx as per pre-operative assessment

## 2018-09-15 LAB — CBC
HEMATOCRIT: 36.7 % (ref 36.0–46.0)
Hemoglobin: 12.8 g/dL (ref 12.0–15.0)
MCH: 31.1 pg (ref 26.0–34.0)
MCHC: 34.9 g/dL (ref 30.0–36.0)
MCV: 89.1 fL (ref 80.0–100.0)
Platelets: 213 10*3/uL (ref 150–400)
RBC: 4.12 MIL/uL (ref 3.87–5.11)
RDW: 12.4 % (ref 11.5–15.5)
WBC: 13.2 10*3/uL — ABNORMAL HIGH (ref 4.0–10.5)
nRBC: 0 % (ref 0.0–0.2)

## 2018-09-15 LAB — BASIC METABOLIC PANEL
Anion gap: 9 (ref 5–15)
BUN: 5 mg/dL — ABNORMAL LOW (ref 8–23)
CHLORIDE: 102 mmol/L (ref 98–111)
CO2: 25 mmol/L (ref 22–32)
Calcium: 9.2 mg/dL (ref 8.9–10.3)
Creatinine, Ser: 0.73 mg/dL (ref 0.44–1.00)
GFR calc Af Amer: >60 mL/min (ref >60)
GFR calc non Af Amer: >60 mL/min (ref >60)
Glucose, Bld: 124 mg/dL — ABNORMAL HIGH (ref 70–99)
POTASSIUM: 3.8 mmol/L (ref 3.5–5.1)
Sodium: 136 mmol/L (ref 135–145)

## 2018-09-15 NOTE — Progress Notes (Signed)
[facility] Progress Note  1 Day Post-Op  Subjective: CC: hungry Patient feeling hungry and hopeful to be able to get something other than liquids today. Pain well controlled. Tolerating FLD. No flatus or BM yet.   Objective: Vital signs in last 24 hours: Temp:  [97.7 F (36.5 C)-98.7 F (37.1 C)] 98.6 F (37 C) (03/14 0533) Pulse Rate:  [49-89] 85 (03/14 0533) Resp:  [11-29] 16 (03/14 0533) BP: (106-139)/(42-66) 123/58 (03/14 0533) SpO2:  [97 %-100 %] 97 % (03/14 0533) Last BM Date: 09/14/18  Intake/Output from previous day: 03/13 0701 - 03/14 0700 In: 1960 [P.O.:360; I.V.:1500; IV Piggyback:100] Out: 6433 [Urine:3675; Blood:100] Intake/Output this shift: No intake/output data recorded.  PE: Gen:  Alert, NAD, pleasant Card:  Regular rate and rhythm Pulm:  Normal effort, clear to auscultation bilaterally Abd: Soft, non-tender, non-distended, +BS, honeycomb to midline  Skin: warm and dry, no rashes  Psych: A&Ox3   Lab Results:  Recent Labs    09/14/18 1433 09/15/18 0309  WBC 14.3* 13.2*  HGB 13.4 12.8  HCT 39.7 36.7  PLT 205 213   BMET Recent Labs    09/14/18 1433 09/15/18 0309  NA  --  136  K  --  3.8  CL  --  102  CO2  --  25  GLUCOSE  --  124*  BUN  --  5*  CREATININE 0.78 0.73  CALCIUM  --  9.2   PT/INR No results for input(s): LABPROT, INR in the last 72 hours. CMP     Component Value Date/Time   NA 136 09/15/2018 0309   K 3.8 09/15/2018 0309   CL 102 09/15/2018 0309   CO2 25 09/15/2018 0309   GLUCOSE 124 (H) 09/15/2018 0309   BUN 5 (L) 09/15/2018 0309   CREATININE 0.73 09/15/2018 0309   CREATININE 0.85 08/10/2018 1157   CALCIUM 9.2 09/15/2018 0309   PROT 6.8 09/03/2018 1404   ALBUMIN 3.9 09/03/2018 1404   AST 19 09/03/2018 1404   AST 15 08/10/2018 1157   ALT 12 09/03/2018 1404   ALT 9 08/10/2018 1157   ALKPHOS 62 09/03/2018 1404   BILITOT 0.5 09/03/2018 1404   BILITOT 0.4 08/10/2018 1157   GFRNONAA >60 09/15/2018 0309    GFRNONAA >60 08/10/2018 1157   GFRAA >60 09/15/2018 0309   GFRAA >60 08/10/2018 1157   Lipase  No results found for: LIPASE     Studies/Results: No results found.  Anti-infectives: Anti-infectives (From admission, onward)   Start     Dose/Rate Route Frequency Ordered Stop   09/14/18 1700  cefoTEtan (CEFOTAN) 2 g in sodium chloride 0.9 % 100 mL IVPB     2 g 200 mL/hr over 30 Minutes Intravenous Every 12 hours 09/14/18 1256 09/14/18 1708   09/14/18 0630  cefoTEtan in Dextrose 5% (CEFOTAN) IVPB 2 g     2 g 100 mL/hr over 30 Minutes Intravenous On call to O.R. 09/14/18 2951 09/14/18 8841       Assessment/Plan S/p laparoscopic assisted ileocecectomy with primary anastomosis - POD#1 - patient tolerating FLD - no flatus yet, can advance to soft if passing flatus later today - pain well controlled - continue to mobilize - await return of bowel function    LOS: 1 day    Brigid Re , [facility] Surgery 09/15/2018, 9:07 AM Pager: [place]: [phone]

## 2018-09-16 MED ORDER — PSYLLIUM 58.6 % PO POWD: 1.0000 | Freq: Every day | ORAL | Status: DC | PRN

## 2018-09-16 MED ORDER — HYDROCODONE-ACETAMINOPHEN 5-325 MG PO TABS: 1.0000 | ORAL_TABLET | Freq: Four times a day (QID) | ORAL | 0 refills | Status: DC | PRN

## 2018-09-16 NOTE — Discharge Summary (Signed)
[facility] Discharge Summary   Patient ID: Martha Osborne MRN: [id number] DOB/AGE: 1950-10-25 68 y.o.  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 Metastatic breast cancer ([place]) 09/14/2018  . Goals of care, counseling/discussion 08/30/2018  . 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 Osteoarthritis of left knee 03/26/2018  . Pain in left knee 02/16/2018    Consultants None  Imaging: No results found.  Procedures Dr. Dalbert Batman (09/14/18) - laparoscopic assisted ileocecectomy with primary anastomosis    Hospital Course:  Patient is a 68 year old female who presented to [facility] OR with a metastatic malignancy of her left colon for laparoscopic assisted left partial colectomy.  Patient was admitted and underwent procedure listed above.  Tolerated procedure well and was transferred to the floor.  Diet was advanced as tolerated.  On POD#2, the patient was voiding well, tolerating diet, ambulating well, pain well controlled, vital signs stable, incisions c/d/i and felt stable for discharge home.  Patient will follow up in our office in 2 weeks and knows to call with questions or concerns. She will call to confirm appointment date/time.    Physical Exam: General:  Alert, NAD, pleasant, comfortable Abd:  Soft, ND, mild tenderness, incisions C/D/I with staples present   Allergies as of 09/16/2018   No Known Allergies     Medication List    STOP taking these medications   metroNIDAZOLE 500 MG tablet Commonly known as:  FLAGYL   neomycin 500 MG tablet Commonly known as:  MYCIFRADIN   Omega-3 Fish Oil 1200 MG Caps     TAKE these medications   HYDROcodone-acetaminophen 5-325 MG tablet Commonly known as:  NORCO/VICODIN Take 1-2 tablets by mouth every 6 (six) hours as needed for severe pain.    letrozole 2.5 MG tablet Commonly known as:  FEMARA Take 2.5 mg by mouth daily.   letrozole 2.5 MG tablet Commonly known as:  FEMARA Take 1 tablet (2.5 mg total) by mouth daily.   psyllium 58.6 % powder Commonly known as:  METAMUCIL Take 1 packet by mouth daily as needed. What changed:    how much to take  when to take this  reasons to take this   VITAMIN D3 PO Take 2,000 Units by mouth daily.        Follow-up Information    Fanny Skates, MD. Call.   Specialty:  General Surgery Why:  Call and schedule an appointment to be seen for staple removal - should be 10 to 14 days after your surgery  Contact information: [address] [place] [phone]           Signed: Brigid Re, [facility] Surgery 09/16/2018, 9:38 AM Pager: [phone] Consults: [phone]

## 2018-09-16 NOTE — Discharge Planning (Signed)
Patient discharged home in stable condition. Verbalizes understanding of all discharge instructions, including home medications and follow up appointments. 

## 2018-09-16 NOTE — Discharge Instructions (Signed)
° ° °Managing Your Pain After Surgery Without Opioids ° ° ° °Thank you for participating in our program to help patients manage their pain after surgery without opioids. This is part of our effort to provide you with the best care possible, without exposing you or your family to the risk that opioids pose. ° °What pain can I expect after surgery? °You can expect to have some pain after surgery. This is normal. The pain is typically worse the day after surgery, and quickly begins to get better. °Many studies have found that many patients are able to manage their pain after surgery with Over-the-Counter (OTC) medications such as Tylenol and Motrin. If you have a condition that does not allow you to take Tylenol or Motrin, notify your surgical team. ° °How will I manage my pain? °The best strategy for controlling your pain after surgery is around the clock pain control with Tylenol (acetaminophen) and Motrin (ibuprofen or Advil). Alternating these medications with each other allows you to maximize your pain control. In addition to Tylenol and Motrin, you can use heating pads or ice packs on your incisions to help reduce your pain. ° °How will I alternate your regular strength over-the-counter pain medication? °You will take a dose of pain medication every three hours. °; Start by taking 650 mg of Tylenol (2 pills of 325 mg) °; 3 hours later take 600 mg of Motrin (3 pills of 200 mg) °; 3 hours after taking the Motrin take 650 mg of Tylenol °; 3 hours after that take 600 mg of Motrin. ° ° °- 1 - ° °See example - if your first dose of Tylenol is at 12:00 PM ° ° °12:00 PM Tylenol 650 mg (2 pills of 325 mg)  °3:00 PM Motrin 600 mg (3 pills of 200 mg)  °6:00 PM Tylenol 650 mg (2 pills of 325 mg)  °9:00 PM Motrin 600 mg (3 pills of 200 mg)  °Continue alternating every 3 hours  ° °We recommend that you follow this schedule around-the-clock for at least 3 days after surgery, or until you feel that it is no longer needed. Use  the table on the last page of this handout to keep track of the medications you are taking. °Important: °Do not take more than 3000mg of Tylenol or 3200mg of Motrin in a 24-hour period. °Do not take ibuprofen/Motrin if you have a history of bleeding stomach ulcers, severe kidney disease, &/or actively taking a blood thinner ° °What if I still have pain? °If you have pain that is not controlled with the over-the-counter pain medications (Tylenol and Motrin or Advil) you might have what we call “breakthrough” pain. You will receive a prescription for a small amount of an opioid pain medication such as Oxycodone, Tramadol, or Tylenol with Codeine. Use these opioid pills in the first 24 hours after surgery if you have breakthrough pain. Do not take more than 1 pill every 4-6 hours. ° °If you still have uncontrolled pain after using all opioid pills, don't hesitate to call our staff using the number provided. We will help make sure you are managing your pain in the best way possible, and if necessary, we can provide a prescription for additional pain medication. ° ° °Day 1   ° °Time  °Name of Medication Number of pills taken  °Amount of Acetaminophen  °Pain Level  ° °Comments  °AM PM       °AM PM       °AM PM       °  AM PM       °AM PM       °AM PM       °AM PM       °AM PM       °Total Daily amount of Acetaminophen °Do not take more than  3,000 mg per day    ° ° °Day 2   ° °Time  °Name of Medication Number of pills °taken  °Amount of Acetaminophen  °Pain Level  ° °Comments  °AM PM       °AM PM       °AM PM       °AM PM       °AM PM       °AM PM       °AM PM       °AM PM       °Total Daily amount of Acetaminophen °Do not take more than  3,000 mg per day    ° ° °Day 3   ° °Time  °Name of Medication Number of pills taken  °Amount of Acetaminophen  °Pain Level  ° °Comments  °AM PM       °AM PM       °AM PM       °AM PM       ° ° ° °AM PM       °AM PM       °AM PM       °AM PM       °Total Daily amount of Acetaminophen °Do  not take more than  3,000 mg per day    ° ° °Day 4   ° °Time  °Name of Medication Number of pills taken  °Amount of Acetaminophen  °Pain Level  ° °Comments  °AM PM       °AM PM       °AM PM       °AM PM       °AM PM       °AM PM       °AM PM       °AM PM       °Total Daily amount of Acetaminophen °Do not take more than  3,000 mg per day    ° ° °Day 5   ° °Time  °Name of Medication Number °of pills taken  °Amount of Acetaminophen  °Pain Level  ° °Comments  °AM PM       °AM PM       °AM PM       °AM PM       °AM PM       °AM PM       °AM PM       °AM PM       °Total Daily amount of Acetaminophen °Do not take more than  3,000 mg per day    ° ° ° °Day 6   ° °Time  °Name of Medication Number of pills °taken  °Amount of Acetaminophen  °Pain Level  °Comments  °AM PM       °AM PM       °AM PM       °AM PM       °AM PM       °AM PM       °AM PM       °AM PM       °Total Daily amount of Acetaminophen °Do not take more than    3,000 mg per day      Day 7    Time  Name of Medication Number of pills taken  Amount of Acetaminophen  Pain Level   Comments  AM PM       AM PM       AM PM       AM PM       AM PM       AM PM       AM PM       AM PM       Total Daily amount of Acetaminophen Do not take more than  3,000 mg per day        For additional information about how and where to safely dispose of unused opioid medications - [employer]  Disclaimer: This document contains information and/or instructional materials adapted from [place] for the typical patient with your condition. It does not replace medical advice from your health care provider because your experience may differ from that of the typical patient. Talk to your health care provider if you have any questions about this document, your condition or your treatment plan. Adapted from [facility], [place]  OPEN ABDOMINAL SURGERY: POST OP INSTRUCTIONS  Always  review your discharge instruction sheet given to you by the facility where your surgery was performed.  IF YOU HAVE DISABILITY OR FAMILY LEAVE FORMS, YOU MUST BRING THEM TO THE OFFICE FOR PROCESSING.  PLEASE DO NOT GIVE THEM TO YOUR DOCTOR.  1. A prescription for pain medication may be given to you upon discharge.  Take your pain medication as prescribed, if needed.  If narcotic pain medicine is not needed, then you may take acetaminophen (Tylenol) or ibuprofen (Advil) as needed. 2. Take your usually prescribed medications unless otherwise directed. 3. If you need a refill on your pain medication, please contact your pharmacy. They will contact our office to request authorization.  Prescriptions will not be filled after 5pm or on week-ends. 4. You should follow a light diet the first few days after arrival home, such as soup and crackers, pudding, etc.unless your doctor has advised otherwise. A high-fiber, low fat diet can be resumed as tolerated.   Be sure to include lots of fluids daily. Most patients will experience some swelling and bruising on the chest and neck area.  Ice packs will help.  Swelling and bruising can take several days to resolve 5. Most patients will experience some swelling and bruising in the area of the incision. Ice pack will help. Swelling and bruising can take several days to resolve..  6. It is common to experience some constipation if taking pain medication after surgery.  Increasing fluid intake and taking a stool softener will usually help or prevent this problem from occurring.  A mild laxative (Milk of Magnesia or Miralax) should be taken according to package directions if there are no bowel movements after 48 hours. 7.  You may have steri-strips (small skin tapes) in place directly over the incision.  These strips should be left on the skin for 7-10 days.  If your surgeon used skin glue on the incision, you may shower in 24 hours.  The glue will flake off over the next 2-3  weeks.  Any sutures or staples will be removed at the office during your follow-up visit. You may find that a light gauze bandage over your incision may keep your staples from being rubbed  or pulled. You may shower and replace the bandage daily. 8. ACTIVITIES:  You may resume regular (light) daily activities beginning the next day--such as daily self-care, walking, climbing stairs--gradually increasing activities as tolerated.  You may have sexual intercourse when it is comfortable.  Refrain from any heavy lifting or straining until approved by your doctor. a. You may drive when you no longer are taking prescription pain medication, you can comfortably wear a seatbelt, and you can safely maneuver your car and apply brakes  9. You should see your doctor in the office for a follow-up appointment approximately two weeks after your surgery.  Make sure that you call for this appointment within a day or two after you arrive home to insure a convenient appointment time.  WHEN TO CALL YOUR DOCTOR: 1. Fever over 101.0 2. Inability to urinate 3. Nausea and/or vomiting 4. Extreme swelling or bruising 5. Continued bleeding from incision. 6. Increased pain, redness, or drainage from the incision. 7. Difficulty swallowing or breathing 8. Muscle cramping or spasms. 9. Numbness or tingling in hands or feet or around lips.  The clinic staff is available to answer your questions during regular business hours.  Please dont hesitate to call and ask to speak to one of the nurses if you have concerns.  For further questions, please visit [email or web address]

## 2018-09-17 ENCOUNTER — Encounter (HOSPITAL_COMMUNITY): Payer: Self-pay | Admitting: General Surgery

## 2018-09-17 ENCOUNTER — Telehealth: Payer: Self-pay | Admitting: Surgery

## 2018-09-17 NOTE — Telephone Encounter (Signed)
The patient s/p colectomy by Dr. Dalbert Batman 3/13 - discharged 3/15.  She has developed a painful mouth/throat, has white plaques, and thinks she has thrush.  I called in Nystatin liquids (500,000 units) QID x 7days to [facility] at [phone].  She'll call if not better.  She is to see Dr. Dalbert Batman in about 2 weeks.  Alphonsa Overall, MD, [facility] Surgery Pager: [phone] Office phone:  [phone]

## 2018-09-18 NOTE — Anesthesia Postprocedure Evaluation (Signed)
Anesthesia Post Note  Patient: Martha Osborne  Procedure(s) Performed: LAPAROSCOPIC  ASSISTED RIGHT COLECTOMY (Right Abdomen)     Patient location during evaluation: PACU Anesthesia Type: General Level of consciousness: awake Vital Signs Assessment: post-procedure vital signs reviewed and stable Respiratory status: spontaneous breathing Cardiovascular status: stable Postop Assessment: no apparent nausea or vomiting Anesthetic complications: no    Last Vitals:  Vitals:   09/15/18 2125 09/16/18 0528  BP: (!) 132/57 (!) 126/52  Pulse: 69 78  Resp: 18 18  Temp: 36.7 C 37 C  SpO2: 98% 96%    Last Pain:  Vitals:   09/16/18 0825  TempSrc:   PainSc: 0-No pain   Pain Goal: Patients Stated Pain Goal: 0 (09/15/18 1900)                 Huston Foley

## 2018-09-26 MED FILL — LETROZOLE 2.5 MG TABS: 2.5 | 30 days supply | Qty: 30 | Fill #1

## 2018-09-28 ENCOUNTER — Encounter: Payer: Self-pay | Admitting: *Deleted

## 2018-09-30 NOTE — Progress Notes (Signed)
Request for paradigm testing sent on specimen [id number] per Dr Antonieta Pert request.

## 2018-10-08 ENCOUNTER — Telehealth: Payer: Self-pay | Admitting: Pharmacist

## 2018-10-08 ENCOUNTER — Encounter: Payer: Self-pay | Admitting: *Deleted

## 2018-10-08 DIAGNOSIS — C50911 Malignant neoplasm of unspecified site of right female breast: Secondary | ICD-10-CM

## 2018-10-08 DIAGNOSIS — C785 Secondary malignant neoplasm of large intestine and rectum: Principal | ICD-10-CM

## 2018-10-08 NOTE — Telephone Encounter (Signed)
Oral Oncology Pharmacist Encounter  Received new prescription for Kisqali (ribociclib) for the treatment of metastatic breast cancer ER+/PR+/HER2- in conjunction with letrozole, planned duration until disease progression or unacceptable drug toxicity.  CBC/BMP from 09/15/2018 assessed, no relevant lab abnormalities. Patient needs a baseline ECG prior to therapy initiation, then at day 14 of the first cycle, and at the start of the second cycle.   Prescription dose and frequency assessed.   Current medication list in Epic reviewed, one DDIs with ribociclib identified: - Hydrocodone: ribociclib may increase the serum concentration of hydrocodone. No baseline adjustment needed. Monitor patient increased hydrocodone adverse effects dizziness, lightheadedness, nausea, vomiting, sedation, etc.  Oral Oncology Clinic will continue to follow for insurance authorization, copayment issues, initial counseling and start date.  Darl Pikes, PharmD, BCPS, [facility] Hematology/Oncology Clinical Pharmacist [facility] Oral [place] [facility] [phone]  10/08/2018 12:18 PM

## 2018-10-08 NOTE — Telephone Encounter (Signed)
Oral Oncology Pharmacist Encounter   Received notification from [facility] that prior authorization for Thelma Comp is required.   PA submitted on [facility] [place] [id number] Status is pending   Oral Oncology Clinic will continue to follow.   Darl Pikes, PharmD, BCPS, [facility] Hematology/Oncology Clinical Pharmacist [facility] Oral [place] [facility] [phone]  10/08/2018 2:16 PM

## 2018-10-09 ENCOUNTER — Encounter (HOSPITAL_COMMUNITY): Payer: Self-pay | Admitting: Hematology & Oncology

## 2018-10-09 NOTE — Telephone Encounter (Addendum)
Oral Oncology Pharmacist Encounter   Prior Authorization for Thelma Comp has been approved.   Effective dates: 10/08/2018 through until further notice  Copay: $1483.59, due to the high copay of Kisqali Dr. Marin Olp would like for [place] to look into the copay of Ibrance and Verzenio.   Oral Oncology Clinic will continue to follow.   Darl Pikes, PharmD, BCPS. [facility] Hematology/Oncology Clinical Pharmacist [facility] Oral [facility] [phone]  10/09/2018 11:08 AM

## 2018-10-10 ENCOUNTER — Other Ambulatory Visit: Payer: Self-pay

## 2018-10-10 ENCOUNTER — Inpatient Hospital Stay: Payer: Medicare Other

## 2018-10-10 ENCOUNTER — Encounter: Payer: Self-pay | Admitting: Hematology & Oncology

## 2018-10-10 ENCOUNTER — Telehealth: Payer: Self-pay | Admitting: Hematology & Oncology

## 2018-10-10 ENCOUNTER — Inpatient Hospital Stay: Payer: Medicare Other | Attending: Hematology & Oncology | Admitting: Hematology & Oncology

## 2018-10-10 VITALS — BP 132/55 | HR 66 | Temp 98.4°F | Resp 20 | Wt 132.1 lb

## 2018-10-10 DIAGNOSIS — Z9049 Acquired absence of other specified parts of digestive tract: Secondary | ICD-10-CM

## 2018-10-10 DIAGNOSIS — R109 Unspecified abdominal pain: Secondary | ICD-10-CM | POA: Diagnosis not present

## 2018-10-10 DIAGNOSIS — C785 Secondary malignant neoplasm of large intestine and rectum: Principal | ICD-10-CM

## 2018-10-10 DIAGNOSIS — Z79899 Other long term (current) drug therapy: Secondary | ICD-10-CM

## 2018-10-10 DIAGNOSIS — C50312 Malignant neoplasm of lower-inner quadrant of left female breast: Secondary | ICD-10-CM

## 2018-10-10 DIAGNOSIS — C50911 Malignant neoplasm of unspecified site of right female breast: Secondary | ICD-10-CM

## 2018-10-10 DIAGNOSIS — Z170 Estrogen receptor positive status [ER+]: Secondary | ICD-10-CM

## 2018-10-10 DIAGNOSIS — Z9289 Personal history of other medical treatment: Secondary | ICD-10-CM

## 2018-10-10 DIAGNOSIS — Z79811 Long term (current) use of aromatase inhibitors: Secondary | ICD-10-CM

## 2018-10-10 HISTORY — DX: Personal history of other medical treatment: Z92.89

## 2018-10-10 LAB — CBC WITH DIFFERENTIAL (CANCER CENTER ONLY)
Abs Immature Granulocytes: 0.01 10*3/uL (ref 0.00–0.07)
Basophils Absolute: 0.1 10*3/uL (ref 0.0–0.1)
Basophils Relative: 1 %
Eosinophils Absolute: 0.1 10*3/uL (ref 0.0–0.5)
Eosinophils Relative: 2 %
HCT: 38.2 % (ref 36.0–46.0)
Hemoglobin: 12 g/dL (ref 12.0–15.0)
Immature Granulocytes: 0 %
Lymphocytes Relative: 26 %
Lymphs Abs: 1.3 10*3/uL (ref 0.7–4.0)
MCH: 29.6 pg (ref 26.0–34.0)
MCHC: 31.4 g/dL (ref 30.0–36.0)
MCV: 94.3 fL (ref 80.0–100.0)
Monocytes Absolute: 0.4 10*3/uL (ref 0.1–1.0)
Monocytes Relative: 7 %
Neutro Abs: 3.4 10*3/uL (ref 1.7–7.7)
Neutrophils Relative %: 64 %
Platelet Count: 272 10*3/uL (ref 150–400)
RBC: 4.05 MIL/uL (ref 3.87–5.11)
RDW: 12.7 % (ref 11.5–15.5)
WBC Count: 5.2 10*3/uL (ref 4.0–10.5)
nRBC: 0 % (ref 0.0–0.2)

## 2018-10-10 LAB — CMP (CANCER CENTER ONLY)
ALT: 15 U/L (ref 0–44)
AST: 16 U/L (ref 15–41)
Albumin: 4.4 g/dL (ref 3.5–5.0)
Alkaline Phosphatase: 65 U/L (ref 38–126)
Anion gap: 7 (ref 5–15)
BUN: 12 mg/dL (ref 8–23)
CO2: 29 mmol/L (ref 22–32)
Calcium: 9.4 mg/dL (ref 8.9–10.3)
Chloride: 103 mmol/L (ref 98–111)
Creatinine: 0.78 mg/dL (ref 0.44–1.00)
GFR, Est AFR Am: >60 mL/min (ref >60)
GFR, Estimated: >60 mL/min (ref >60)
Glucose, Bld: 94 mg/dL (ref 70–99)
Potassium: 4.2 mmol/L (ref 3.5–5.1)
Sodium: 139 mmol/L (ref 135–145)
Total Bilirubin: 0.3 mg/dL (ref 0.3–1.2)
Total Protein: 6.9 g/dL (ref 6.5–8.1)

## 2018-10-10 LAB — LACTATE DEHYDROGENASE: LDH: 105 U/L (ref 98–192)

## 2018-10-10 NOTE — Telephone Encounter (Signed)
lmom to inform pt of 5/5 appt at 1030 am per 4/8 LOS

## 2018-10-10 NOTE — Progress Notes (Signed)
Hematology and Oncology Follow Up Visit  Martha Osborne [id number] Sep 05, 1950 68 y.o. 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 daily  Ribociclib 600 mg p.o. daily (21/7)     Interim History:  Martha Osborne is back for follow-up.  She did have surgery for the colonic metastasis.  She underwent a partial colectomy on 09/14/2018.  The pathology report ([id number]) showed 2 foci of metastatic lobular carcinoma.  She had 11 of 20+ lymph nodes.  All margins were negative.  Her appendix was negative for cancer.  We did do molecular studies.  Her tumor is ER positive and PR positive.  It is HER-2 negative.  We checked her for BRCA which was also negative.  She had low TMB.  The tumor was MSI stable.  She was negative for PD-L1.  Also she was negative for mutation in PIK3CA.  Unfortunately, the issue we are going to have now is her try to get the ribociclib.  Her co-pay is 1500 bucks a month.  If this is unacceptable to me.  I will see if we cannot get this from the pharmaceutical company.  If not, I will use another CK4/CK6 inhibitor.  She does have the primary in the left breast.  She is asymptomatic with this.  I am not sure if we have to remove this tumor.  However, we will see how she responds to antiestrogen therapy.  In 3 months I would repeat the breast MRI and see how the tumor looks.  Of note, her CA 27.29 was normal at 23.1 so I doubt we can use this as a marker.  She has a good appetite.  She was only in the hospital for 2 days.  She is having some discomfort in the right lower quadrant of the abdomen.  I am sure this is probably scar tissue from where she had most of her surgery performed.  There has been no problems with bowels or bladder.  She has had no cough.  There has been no headache.  There has been no leg swelling.   Overall, her performance status is ECOG 0.  Medications:  Current Outpatient Medications:  .  Cholecalciferol (VITAMIN D3 PO), Take 2,000 Units by mouth daily., Disp: , Rfl:  .  letrozole (FEMARA) 2.5 MG tablet, Take 1 tablet (2.5 mg total) by mouth daily., Disp: 30 tablet, Rfl: 12 .  psyllium (METAMUCIL) 58.6 % powder, Take 1 packet by mouth daily as needed., Disp: , Rfl:  .  letrozole (FEMARA) 2.5 MG tablet, Take 2.5 mg by mouth daily., Disp: , Rfl:   Allergies: No Known Allergies  Past Medical History, Surgical history, Social history, and Family History were reviewed and updated.  Review of Systems: Review of Systems  Constitutional: Negative.   HENT:  Negative.   Eyes: Negative.   Respiratory: Negative.   Cardiovascular: Negative.   Gastrointestinal: Positive for abdominal pain.  Endocrine: Negative.   Genitourinary: Negative.    Musculoskeletal: Negative.   Skin: Negative.   Neurological: Negative.   Hematological: Negative.   Psychiatric/Behavioral: Negative.     Physical Exam:  weight is 132 lb 1.3 oz (59.9 kg). Her oral temperature is 98.4 F (36.9 C). Her blood pressure is 132/55 (abnormal) and her pulse is 66. Her respiration is 20 and oxygen saturation is 100%.   Wt Readings from Last  3 Encounters:  10/10/18 132 lb 1.3 oz (59.9 kg)  09/14/18 137 lb (62.1 kg)  09/03/18 138 lb (62.6 kg)    Physical Exam Vitals signs reviewed.  Constitutional:      Comments: Breast exam bilaterally shows no breast masses.  She has no nipple discharge bilaterally.  She has no breast swelling or erythema.  There is no bilateral axillary adenopathy.  HENT:     Head: Normocephalic and atraumatic.  Eyes:     Pupils: Pupils are equal, round, and reactive to light.  Neck:     Musculoskeletal: Normal range of motion.  Cardiovascular:     Rate and Rhythm: Normal rate and regular rhythm.     Heart sounds: Normal heart sounds.  Pulmonary:     Effort: Pulmonary effort is normal.      Breath sounds: Normal breath sounds.  Abdominal:     General: Bowel sounds are normal.     Palpations: Abdomen is soft.     Comments: Abdominal exam shows the healing laparotomy scar.  She has no swelling.  There is no erythema about the surgical site.  She has been tenderness to palpation in the right lower quadrant.  No masses noted.  Bowel sounds are present.  There is no palpable liver or spleen tip.  Musculoskeletal: Normal range of motion.        General: No tenderness or deformity.  Lymphadenopathy:     Cervical: No cervical adenopathy.  Skin:    General: Skin is warm and dry.     Findings: No erythema or rash.  Neurological:     Mental Status: She is alert and oriented to person, place, and time.  Psychiatric:        Behavior: Behavior normal.        Thought Content: Thought content normal.        Judgment: Judgment normal.      Lab Results  Component Value Date   WBC 5.2 10/10/2018   HGB 12.0 10/10/2018   HCT 38.2 10/10/2018   MCV 94.3 10/10/2018   PLT 272 10/10/2018     Chemistry      Component Value Date/Time   NA 139 10/10/2018 0933   K 4.2 10/10/2018 0933   CL 103 10/10/2018 0933   CO2 29 10/10/2018 0933   BUN 12 10/10/2018 0933   CREATININE 0.78 10/10/2018 0933      Component Value Date/Time   CALCIUM 9.4 10/10/2018 0933   ALKPHOS 65 10/10/2018 0933   AST 16 10/10/2018 0933   ALT 15 10/10/2018 0933   BILITOT 0.3 10/10/2018 0933       Impression and Plan: Martha Osborne is a 68 year old postmenopausal female.  She has metastatic lobular carcinoma of the left breast.  We finally found their primary after doing a breast MRI.  She had resection of the colonic mass.  She had multiple positive lymph nodes.  Our issue now is try to get the ribociclib for her.  If this does not work and we cannot get this to her for a reasonable price, then I will change her treatment to either Ibrance or Verzenio.  I spent about 45 minutes with Martha Osborne.  Her husband  was on the cell phone.  I answered all their questions.  All the time was spent talking about her breast cancer and treatment.  I reviewed her labs.  I reviewed her pathology.  I will do scans after 3 months.  This will give [place] a good idea  as to whether or not she will need to have surgery for the left breast primary.  I want to see her back in 1 month.   Volanda Napoleon, MD 4/8/202011:13 AM

## 2018-10-11 LAB — CANCER ANTIGEN 27.29: CA 27.29: 20.6 U/mL (ref 0.0–38.6)

## 2018-10-16 ENCOUNTER — Encounter: Payer: Self-pay | Admitting: *Deleted

## 2018-10-16 NOTE — Progress Notes (Signed)
Patient has a high co-pay for her prescribed therapy and with the assistance of [employer], [facility], different options are being examined.  Patient's co-pay with originally planned Kisquali approx $1500. Her household income precludes her from any of the financial assistance programs we have available to [place]. Ad a possible option, treatment with Verzenio was also explored and this therapy would be a $3000 copay.  After speaking to Dr Marin Olp, he would like to proceed with Verzenio using the initial first month free voucher, that is provided by [employer], and then using samples.  Contacted [employer], the drug rep with [employer] and inquired about samples, she stated that she could send two months worth of samples, but was unsure that enough samples could be provided to maintain appropriate dosing. She will also send our inquiry to [employer] Specialist to see if he can find any addition resources to help pay for patient's prescription.  Spoke with patient. She is comfortable accepting the copay cost for [place]. She does not want to start any medication that will be based on dispensing samples. She states that she will wait to see what Ralph Leyden comes back with, but that unless there is a solid plan to reduce the copay to less than that of [place], she will proceed with starting [place].  Information sent to Dr Marin Olp. Will continue to work and follow this case. Currently awaiting information from [employer].

## 2018-10-16 NOTE — Telephone Encounter (Signed)
Oral Oncology Pharmacist Encounter   Prior Authorization for [employer] and [employer] have been approved.    Verzenio  PA submitted on CMM Key [id number] Effective dates: 10/10/2018 through until further notice  Copay: $2901.14  [facility] PA submitted on [facility] Key [id number] Effective dates: 10/11/2018 through until further notice  Copay: $2860.08  Copay information shared with patient and Dr. Marin Olp. Oral Oncology Clinic will continue to follow pt/provider decision on which CDK4/6 inhibitor to go with.   Due to income, pt does not qualify for manufacture assistance or foundation assistance.  Darl Pikes, PharmD, BCPS. BCOP Hematology/Oncology Clinical Pharmacist [facility] Oral Chemotherapy [facility] [phone]  10/16/2018 9:35 AM

## 2018-10-17 ENCOUNTER — Encounter: Payer: Self-pay | Admitting: *Deleted

## 2018-10-17 ENCOUNTER — Other Ambulatory Visit: Payer: Self-pay | Admitting: *Deleted

## 2018-10-17 ENCOUNTER — Telehealth: Payer: Self-pay | Admitting: Pharmacist

## 2018-10-17 ENCOUNTER — Other Ambulatory Visit: Payer: Self-pay | Admitting: Hematology & Oncology

## 2018-10-17 MED ORDER — RIBOCICLIB SUCC (600 MG DOSE) 200 MG PO TBPK: 600.0000 mg | ORAL_TABLET | Freq: Every day | ORAL | 6 refills | Status: DC

## 2018-10-17 MED ORDER — LETROZOLE 2.5 MG PO TABS: 2.5000 mg | ORAL_TABLET | Freq: Every day | ORAL | 12 refills | Status: DC

## 2018-10-17 NOTE — Telephone Encounter (Signed)
Oral Chemotherapy Pharmacist Encounter  Successfully enrolled patient for a one time one month free voucher for Kisqali and letrozole.  ID: [id number] BIN: [id number] Group: [id number] PCN: [id number]  Billing information will be shared with [place]. I will place a copy of the voucher to be scanned into patient's chart.  Darl Pikes, PharmD, BCPS Hematology/Oncology Clinical Pharmacist [facility]/AP [facility] [phone]  10/17/2018 3:58 PM

## 2018-10-17 NOTE — Progress Notes (Signed)
[employer] cannot find any additional financial aid for patient. As such, per patient request, and original plan, Dr Marin Olp will send in [facility] prescription to [facility].   Patient is notified and will expect a reach out from [facility] once they get prescription.

## 2018-10-17 NOTE — Telephone Encounter (Signed)
Oral Chemotherapy Pharmacist Encounter  It has been decided to move ahead with the Kisqali, she will use a one month free voucher for her first month of medication. Medication will be delivered on 10/19/18.  Patient Education I spoke with patient for overview of new oral chemotherapy medication: Kisqali (ribociclib) for the treatment of metastatic breast cancer ER+/PR+/HER2- in conjunction with letrozole, planned duration until disease progression or unacceptable drug toxicity.  Pt is doing well. Counseled patient on administration, dosing, side effects, monitoring, drug-food interactions, safe handling, storage, and disposal. Patient will take 600 mg by mouth daily. Take 3 capsules daily for 21 days on then 7 days off.  Side effects include but not limited to: diarrhea, N/V, decreased wbc/hgb, fatigue.    Reviewed with patient importance of keeping a medication schedule and plan for any missed doses.  Martha Osborne voiced understanding and appreciation. All questions answered. Medication handout already mailed to the patient.  Provided patient with [facility] phone number. Patient knows to call the office with questions or concerns. Oral Chemotherapy Navigation Clinic will continue to follow.  Darl Pikes, PharmD, BCPS, [facility] Hematology/Oncology Clinical Pharmacist [facility]/AP [facility] [phone]  10/17/2018 2:23 PM

## 2018-10-18 MED FILL — KISQALI 600 MG DAILY DOSE: 200 | 28 days supply | Qty: 63 | Fill #0

## 2018-10-18 MED FILL — LETROZOLE 2.5 MG TABLET: 2.5 | 30 days supply | Qty: 30 | Fill #0

## 2018-10-24 ENCOUNTER — Encounter: Payer: Self-pay | Admitting: *Deleted

## 2018-10-30 ENCOUNTER — Encounter: Payer: Self-pay | Admitting: *Deleted

## 2018-10-30 ENCOUNTER — Telehealth: Payer: Self-pay | Admitting: Hematology & Oncology

## 2018-10-30 NOTE — Telephone Encounter (Signed)
Called and spoke with patient regarding appointments being r/s from 5/5 to 5/14.  She is ok with new date/time per 4/28 staff message

## 2018-11-06 ENCOUNTER — Ambulatory Visit: Payer: Medicare Other | Admitting: Hematology & Oncology

## 2018-11-06 ENCOUNTER — Other Ambulatory Visit: Payer: Medicare Other

## 2018-11-15 ENCOUNTER — Inpatient Hospital Stay (HOSPITAL_BASED_OUTPATIENT_CLINIC_OR_DEPARTMENT_OTHER): Payer: Medicare Other | Admitting: Hematology & Oncology

## 2018-11-15 ENCOUNTER — Other Ambulatory Visit: Payer: Self-pay

## 2018-11-15 ENCOUNTER — Encounter: Payer: Self-pay | Admitting: Hematology & Oncology

## 2018-11-15 ENCOUNTER — Inpatient Hospital Stay: Payer: Medicare Other | Attending: Hematology & Oncology

## 2018-11-15 VITALS — BP 125/52 | HR 84 | Temp 98.3°F | Resp 19 | Wt 132.0 lb

## 2018-11-15 DIAGNOSIS — Z79811 Long term (current) use of aromatase inhibitors: Secondary | ICD-10-CM | POA: Diagnosis not present

## 2018-11-15 DIAGNOSIS — Z79899 Other long term (current) drug therapy: Secondary | ICD-10-CM | POA: Diagnosis not present

## 2018-11-15 DIAGNOSIS — C50912 Malignant neoplasm of unspecified site of left female breast: Secondary | ICD-10-CM | POA: Diagnosis not present

## 2018-11-15 DIAGNOSIS — C50911 Malignant neoplasm of unspecified site of right female breast: Secondary | ICD-10-CM

## 2018-11-15 DIAGNOSIS — Z170 Estrogen receptor positive status [ER+]: Secondary | ICD-10-CM | POA: Diagnosis not present

## 2018-11-15 DIAGNOSIS — C785 Secondary malignant neoplasm of large intestine and rectum: Secondary | ICD-10-CM

## 2018-11-15 DIAGNOSIS — C50312 Malignant neoplasm of lower-inner quadrant of left female breast: Secondary | ICD-10-CM | POA: Diagnosis present

## 2018-11-15 LAB — CBC WITH DIFFERENTIAL (CANCER CENTER ONLY)
Abs Immature Granulocytes: 0 10*3/uL (ref 0.00–0.07)
Basophils Absolute: 0 10*3/uL (ref 0.0–0.1)
Basophils Relative: 1 %
Eosinophils Absolute: 0 10*3/uL (ref 0.0–0.5)
Eosinophils Relative: 0 %
HCT: 36.7 % (ref 36.0–46.0)
Hemoglobin: 11.8 g/dL — ABNORMAL LOW (ref 12.0–15.0)
Immature Granulocytes: 0 %
Lymphocytes Relative: 47 %
Lymphs Abs: 1 10*3/uL (ref 0.7–4.0)
MCH: 30.4 pg (ref 26.0–34.0)
MCHC: 32.2 g/dL (ref 30.0–36.0)
MCV: 94.6 fL (ref 80.0–100.0)
Monocytes Absolute: 0.2 10*3/uL (ref 0.1–1.0)
Monocytes Relative: 9 %
Neutro Abs: 0.9 10*3/uL — ABNORMAL LOW (ref 1.7–7.7)
Neutrophils Relative %: 43 %
Platelet Count: 174 10*3/uL (ref 150–400)
RBC: 3.88 MIL/uL (ref 3.87–5.11)
RDW: 15.6 % — ABNORMAL HIGH (ref 11.5–15.5)
WBC Count: 2.2 10*3/uL — ABNORMAL LOW (ref 4.0–10.5)
nRBC: 0 % (ref 0.0–0.2)

## 2018-11-15 LAB — CMP (CANCER CENTER ONLY)
ALT: 11 U/L (ref 0–44)
AST: 14 U/L — ABNORMAL LOW (ref 15–41)
Albumin: 4.2 g/dL (ref 3.5–5.0)
Alkaline Phosphatase: 72 U/L (ref 38–126)
Anion gap: 8 (ref 5–15)
BUN: 13 mg/dL (ref 8–23)
CO2: 29 mmol/L (ref 22–32)
Calcium: 9.9 mg/dL (ref 8.9–10.3)
Chloride: 104 mmol/L (ref 98–111)
Creatinine: 0.97 mg/dL (ref 0.44–1.00)
GFR, Est AFR Am: >60 mL/min (ref >60)
GFR, Estimated: >60 mL/min (ref >60)
Glucose, Bld: 91 mg/dL (ref 70–99)
Potassium: 4.6 mmol/L (ref 3.5–5.1)
Sodium: 141 mmol/L (ref 135–145)
Total Bilirubin: 0.3 mg/dL (ref 0.3–1.2)
Total Protein: 6.9 g/dL (ref 6.5–8.1)

## 2018-11-15 MED FILL — LETROZOLE 2.5 MG TABLET: 2.5 | 30 days supply | Qty: 30 | Fill #1

## 2018-11-15 MED FILL — KISQALI 600 MG DAILY DOSE: 200 | 28 days supply | Qty: 63 | Fill #1

## 2018-11-15 NOTE — Progress Notes (Signed)
Hematology and Oncology Follow Up Visit  Martha Osborne [id number] February 21, 1951 68 y.o. 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 daily  Ribociclib 600 mg p.o. daily (21/7)     Interim History:  Martha Osborne is back for follow-up.  She did have surgery for the colonic metastasis.  She underwent a partial colectomy on 09/14/2018.  The pathology report ([id number]) showed 2 foci of metastatic lobular carcinoma.  She had 11 of 20+ lymph nodes.  All margins were negative.  Her appendix was negative for cancer.  We did do molecular studies.  Her tumor is ER positive and PR positive.  It is HER-2 negative.  We checked her for BRCA which was also negative.  She had low TMB.  The tumor was MSI stable.  She was negative for PD-L1.  Also she was negative for mutation in PIK3CA.  For right now, she is doing quite well.  She has had no problems ribociclib or the Femara.  She has had no joint issues.  She has had no diarrhea.  There is been no cough or shortness of breath.  She recovered incredibly well from the colonic surgery that she had to remove the metastatic disease.  There is been no change in bowel or bladder habits.  She has had no bleeding.  She has been staying very active.   Overall, her performance status is ECOG 0.  Medications:  Current Outpatient Medications:  .  Cholecalciferol (VITAMIN D3 PO), Take 2,000 Units by mouth daily., Disp: , Rfl:  .  letrozole (FEMARA) 2.5 MG tablet, Take 1 tablet (2.5 mg total) by mouth daily., Disp: 30 tablet, Rfl: 12 .  psyllium (METAMUCIL) 58.6 % powder, Take 1 packet by mouth daily as needed., Disp: , Rfl:  .  ribociclib succ 200 MG TBPK, Take 600 mg by mouth daily. Take 3 capsules daily for 21 days on then 7 days off., Disp: 63 each, Rfl: 6  Allergies: No Known Allergies  Past Medical  History, Surgical history, Social history, and Family History were reviewed and updated.  Review of Systems: Review of Systems  Constitutional: Negative.   HENT:  Negative.   Eyes: Negative.   Respiratory: Negative.   Cardiovascular: Negative.   Gastrointestinal: Positive for abdominal pain.  Endocrine: Negative.   Genitourinary: Negative.    Musculoskeletal: Negative.   Skin: Negative.   Neurological: Negative.   Hematological: Negative.   Psychiatric/Behavioral: Negative.     Physical Exam:  weight is 132 lb (59.9 kg). Her oral temperature is 98.3 F (36.8 C). Her blood pressure is 125/52 (abnormal) and her pulse is 84. Her respiration is 19 and oxygen saturation is 100%.   Wt Readings from Last 3 Encounters:  11/15/18 132 lb (59.9 kg)  10/10/18 132 lb 1.3 oz (59.9 kg)  09/14/18 137 lb (62.1 kg)    Physical Exam Vitals signs reviewed.  Constitutional:      Comments: Breast exam bilaterally shows no breast masses.  She has no nipple discharge bilaterally.  She has no breast swelling or erythema.  There is no bilateral axillary adenopathy.  HENT:     Head: Normocephalic and atraumatic.  Eyes:     Pupils: Pupils are equal, round, and reactive to light.  Neck:     Musculoskeletal: Normal range of motion.  Cardiovascular:     Rate and  Rhythm: Normal rate and regular rhythm.     Heart sounds: Normal heart sounds.  Pulmonary:     Effort: Pulmonary effort is normal.     Breath sounds: Normal breath sounds.  Abdominal:     General: Bowel sounds are normal.     Palpations: Abdomen is soft.     Comments: Abdominal exam shows the healing laparotomy scar.  She has no swelling.  There is no erythema about the surgical site.  She has been tenderness to palpation in the right lower quadrant.  No masses noted.  Bowel sounds are present.  There is no palpable liver or spleen tip.  Musculoskeletal: Normal range of motion.        General: No tenderness or deformity.  Lymphadenopathy:      Cervical: No cervical adenopathy.  Skin:    General: Skin is warm and dry.     Findings: No erythema or rash.  Neurological:     Mental Status: She is alert and oriented to person, place, and time.  Psychiatric:        Behavior: Behavior normal.        Thought Content: Thought content normal.        Judgment: Judgment normal.      Lab Results  Component Value Date   WBC 2.2 (L) 11/15/2018   HGB 11.8 (L) 11/15/2018   HCT 36.7 11/15/2018   MCV 94.6 11/15/2018   PLT 174 11/15/2018     Chemistry      Component Value Date/Time   NA 139 10/10/2018 0933   K 4.2 10/10/2018 0933   CL 103 10/10/2018 0933   CO2 29 10/10/2018 0933   BUN 12 10/10/2018 0933   CREATININE 0.78 10/10/2018 0933      Component Value Date/Time   CALCIUM 9.4 10/10/2018 0933   ALKPHOS 65 10/10/2018 0933   AST 16 10/10/2018 0933   ALT 15 10/10/2018 0933   BILITOT 0.3 10/10/2018 0933       Impression and Plan: Martha Osborne is a 68 year old postmenopausal female.  She has metastatic lobular carcinoma of the left breast.  We finally found their primary after doing a breast MRI.  She had resection of the colonic mass.  She had multiple positive lymph nodes.  I just feel bad that she has to pay so much for the ribociclib.  Hopefully, we can get the drug reps back into the office, we will get some free samples to try to help her out.  I still do not think we have to make a dosage adjustment.  We will see how her blood counts look in 4 weeks when we get her back to see [place].  I would not plan for any scans probably until late June or early July.   Her husband was listening on the cell phone.Marland Kitchen   Martha Napoleon, MD 5/14/20209:19 AM

## 2018-11-16 LAB — CANCER ANTIGEN 27.29: CA 27.29: 31.3 U/mL (ref 0.0–38.6)

## 2018-12-03 DIAGNOSIS — M25512 Pain in left shoulder: Secondary | ICD-10-CM | POA: Diagnosis not present

## 2018-12-11 MED FILL — LETROZOLE 2.5 MG TABLET: 2.5 | 30 days supply | Qty: 30 | Fill #2

## 2018-12-11 MED FILL — KISQALI 600 MG DAILY DOSE: 200 | 28 days supply | Qty: 63 | Fill #2

## 2018-12-20 ENCOUNTER — Telehealth: Payer: Self-pay | Admitting: Hematology & Oncology

## 2018-12-20 ENCOUNTER — Inpatient Hospital Stay (HOSPITAL_BASED_OUTPATIENT_CLINIC_OR_DEPARTMENT_OTHER): Payer: Medicare Other | Admitting: Hematology & Oncology

## 2018-12-20 ENCOUNTER — Encounter: Payer: Self-pay | Admitting: *Deleted

## 2018-12-20 ENCOUNTER — Other Ambulatory Visit: Payer: Self-pay

## 2018-12-20 ENCOUNTER — Inpatient Hospital Stay: Payer: Medicare Other | Attending: Hematology & Oncology

## 2018-12-20 ENCOUNTER — Encounter: Payer: Self-pay | Admitting: Hematology & Oncology

## 2018-12-20 VITALS — BP 130/44 | HR 75 | Temp 98.4°F | Resp 16 | Wt 136.0 lb

## 2018-12-20 DIAGNOSIS — R21 Rash and other nonspecific skin eruption: Secondary | ICD-10-CM | POA: Insufficient documentation

## 2018-12-20 DIAGNOSIS — C50911 Malignant neoplasm of unspecified site of right female breast: Secondary | ICD-10-CM

## 2018-12-20 DIAGNOSIS — Z79899 Other long term (current) drug therapy: Secondary | ICD-10-CM | POA: Diagnosis not present

## 2018-12-20 DIAGNOSIS — Z9049 Acquired absence of other specified parts of digestive tract: Secondary | ICD-10-CM | POA: Insufficient documentation

## 2018-12-20 DIAGNOSIS — Z170 Estrogen receptor positive status [ER+]: Secondary | ICD-10-CM | POA: Insufficient documentation

## 2018-12-20 DIAGNOSIS — C50312 Malignant neoplasm of lower-inner quadrant of left female breast: Secondary | ICD-10-CM | POA: Diagnosis not present

## 2018-12-20 DIAGNOSIS — C785 Secondary malignant neoplasm of large intestine and rectum: Secondary | ICD-10-CM

## 2018-12-20 DIAGNOSIS — Z79811 Long term (current) use of aromatase inhibitors: Secondary | ICD-10-CM | POA: Insufficient documentation

## 2018-12-20 LAB — CMP (CANCER CENTER ONLY)
ALT: 13 U/L (ref 0–44)
AST: 17 U/L (ref 15–41)
Albumin: 4.2 g/dL (ref 3.5–5.0)
Alkaline Phosphatase: 72 U/L (ref 38–126)
Anion gap: 9 (ref 5–15)
BUN: 12 mg/dL (ref 8–23)
CO2: 27 mmol/L (ref 22–32)
Calcium: 9.5 mg/dL (ref 8.9–10.3)
Chloride: 101 mmol/L (ref 98–111)
Creatinine: 0.94 mg/dL (ref 0.44–1.00)
GFR, Est AFR Am: >60 mL/min (ref >60)
GFR, Estimated: >60 mL/min (ref >60)
Glucose, Bld: 108 mg/dL — ABNORMAL HIGH (ref 70–99)
Potassium: 4.2 mmol/L (ref 3.5–5.1)
Sodium: 137 mmol/L (ref 135–145)
Total Bilirubin: 0.4 mg/dL (ref 0.3–1.2)
Total Protein: 6.7 g/dL (ref 6.5–8.1)

## 2018-12-20 LAB — CBC WITH DIFFERENTIAL (CANCER CENTER ONLY)
Abs Immature Granulocytes: 0.01 10*3/uL (ref 0.00–0.07)
Basophils Absolute: 0.1 10*3/uL (ref 0.0–0.1)
Basophils Relative: 2 %
Eosinophils Absolute: 0 10*3/uL (ref 0.0–0.5)
Eosinophils Relative: 1 %
HCT: 35.7 % — ABNORMAL LOW (ref 36.0–46.0)
Hemoglobin: 11.4 g/dL — ABNORMAL LOW (ref 12.0–15.0)
Immature Granulocytes: 0 %
Lymphocytes Relative: 30 %
Lymphs Abs: 1 10*3/uL (ref 0.7–4.0)
MCH: 30.9 pg (ref 26.0–34.0)
MCHC: 31.9 g/dL (ref 30.0–36.0)
MCV: 96.7 fL (ref 80.0–100.0)
Monocytes Absolute: 0.2 10*3/uL (ref 0.1–1.0)
Monocytes Relative: 7 %
Neutro Abs: 2 10*3/uL (ref 1.7–7.7)
Neutrophils Relative %: 60 %
Platelet Count: 252 10*3/uL (ref 150–400)
RBC: 3.69 MIL/uL — ABNORMAL LOW (ref 3.87–5.11)
RDW: 18.8 % — ABNORMAL HIGH (ref 11.5–15.5)
WBC Count: 3.4 10*3/uL — ABNORMAL LOW (ref 4.0–10.5)
nRBC: 0 % (ref 0.0–0.2)

## 2018-12-20 MED ORDER — METHYLPREDNISOLONE 4 MG PO TBPK: ORAL_TABLET | ORAL | 3 refills | Status: DC

## 2018-12-20 NOTE — Progress Notes (Signed)
Patient is here for a follow up appointment. She is in the third month of treatment with Kisqali/Femara.  Visited with patient in the exam room. She is frustrated with the medication she is on. She says she has more good days than bad, but her bad days are unpredictable and makes this hard for her to plan. She states many times, that she feels like her frustrations are more emotional as she struggles to learn to accept her new normal. She doesn't believe there are any patterns to her symptoms - days where she feels weak, or sudden onset diarrhea (which is limited and doesn't continue for more than a few episodes). Recommended that she try keep a symptom calendar to see if there are any patterns to the patients symptoms that would allow for [place] to pre-treat, or help her with planning. She also expresses stress over the current covid-19 stressors.   Actively listened to patient and allowed her to share her concerns. Validated everything she felt and tried to offer suggestions. Spoke her her about support groups, chaplain support, or social work support. At this time she is not interested in any of the above, but says she will think about it, and if she decides to move forward, she will contact me. Confirmed that she had correct contact information. She will reach out to me as needed.

## 2018-12-20 NOTE — Telephone Encounter (Signed)
Appointments scheduled and patient notified per 6/18 los

## 2018-12-20 NOTE — Progress Notes (Signed)
Hematology and Oncology Follow Up Visit  Martha Osborne [id number] 03-Jun-1951 68 y.o. 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 daily  Ribociclib 600 mg p.o. daily (21/7)     Interim History:  Martha Osborne is back for follow-up.  She did have surgery for the colonic metastasis.  She underwent a partial colectomy on 09/14/2018.  The pathology report ([id number]) showed 2 foci of metastatic lobular carcinoma.  She had 11 of 20+ lymph nodes.  All margins were negative.  Her appendix was negative for cancer.  We did do molecular studies.  Her tumor is ER positive and PR positive.  It is HER-2 negative.  We checked her for BRCA which was also negative.  She had low TMB.  The tumor was MSI stable.  She was negative for PD-L1.  Also she was negative for mutation in PIK3CA.  She, has a fine rash on her arms and legs.  Is on sure is probably from the ribociclib.  I will go ahead and put her on Medrol dose pack.  I will also have her take some over-the-counter Pepcid.  Hopefully, this will help.  She has had a good appetite.  There is been no diarrhea.  She has had no fever.  She is had no bleeding.  Her last CA 27.29 was 31.  Overall, her performance status is ECOG 0.  Medications:  Current Outpatient Medications:  .  Cholecalciferol (VITAMIN D3 PO), Take 2,000 Units by mouth daily., Disp: , Rfl:  .  letrozole (FEMARA) 2.5 MG tablet, Take 1 tablet (2.5 mg total) by mouth daily., Disp: 30 tablet, Rfl: 12 .  psyllium (METAMUCIL) 58.6 % powder, Take 1 packet by mouth daily as needed., Disp: , Rfl:  .  ribociclib succ 200 MG TBPK, Take 600 mg by mouth daily. Take 3 capsules daily for 21 days on then 7 days off., Disp: 63 each, Rfl: 6  Allergies: No Known Allergies  Past Medical History, Surgical history, Social history, and Family History were  reviewed and updated.  Review of Systems: Review of Systems  Constitutional: Negative.   HENT:  Negative.   Eyes: Negative.   Respiratory: Negative.   Cardiovascular: Negative.   Gastrointestinal: Positive for abdominal pain.  Endocrine: Negative.   Genitourinary: Negative.    Musculoskeletal: Negative.   Skin: Negative.   Neurological: Negative.   Hematological: Negative.   Psychiatric/Behavioral: Negative.     Physical Exam:  vitals were not taken for this visit.   Wt Readings from Last 3 Encounters:  11/15/18 132 lb (59.9 kg)  10/10/18 132 lb 1.3 oz (59.9 kg)  09/14/18 137 lb (62.1 kg)    Physical Exam Vitals signs reviewed.  Constitutional:      Comments: Breast exam bilaterally shows no breast masses.  She has no nipple discharge bilaterally.  She has no breast swelling or erythema.  There is no bilateral axillary adenopathy.  HENT:     Head: Normocephalic and atraumatic.  Eyes:     Pupils: Pupils are equal, round, and reactive to light.  Neck:     Musculoskeletal: Normal range of motion.  Cardiovascular:     Rate and Rhythm: Normal rate and regular rhythm.     Heart sounds: Normal heart sounds.  Pulmonary:     Effort: Pulmonary effort is normal.     Breath sounds:  Normal breath sounds.  Abdominal:     General: Bowel sounds are normal.     Palpations: Abdomen is soft.     Comments: Abdominal exam shows the healing laparotomy scar.  She has no swelling.  There is no erythema about the surgical site.  She has been tenderness to palpation in the right lower quadrant.  No masses noted.  Bowel sounds are present.  There is no palpable liver or spleen tip.  Musculoskeletal: Normal range of motion.        General: No tenderness or deformity.  Lymphadenopathy:     Cervical: No cervical adenopathy.  Skin:    General: Skin is warm and dry.     Findings: No erythema or rash.  Neurological:     Mental Status: She is alert and oriented to person, place, and time.   Psychiatric:        Behavior: Behavior normal.        Thought Content: Thought content normal.        Judgment: Judgment normal.      Lab Results  Component Value Date   WBC 3.4 (L) 12/20/2018   HGB 11.4 (L) 12/20/2018   HCT 35.7 (L) 12/20/2018   MCV 96.7 12/20/2018   PLT 252 12/20/2018     Chemistry      Component Value Date/Time   NA 137 12/20/2018 0847   K 4.2 12/20/2018 0847   CL 101 12/20/2018 0847   CO2 27 12/20/2018 0847   BUN 12 12/20/2018 0847   CREATININE 0.94 12/20/2018 0847      Component Value Date/Time   CALCIUM 9.5 12/20/2018 0847   ALKPHOS 72 12/20/2018 0847   AST 17 12/20/2018 0847   ALT 13 12/20/2018 0847   BILITOT 0.4 12/20/2018 0847       Impression and Plan: Martha Osborne is a 68 year old postmenopausal female.  She has metastatic lobular carcinoma of the left breast.  We finally found their primary after doing a breast MRI.  She had resection of the colonic mass.  She had multiple positive lymph nodes.  I patient that she has this rash.  Hopefully, with the Medrol Dosepak and Pepcid, the rash will improve.  She is on her third month of the ribociclib.  We will see what her tumor markers shows.  I probably will set her up with scans in August.  We will get her back in another month.     Volanda Napoleon, MD 6/18/20209:22 AM

## 2018-12-21 ENCOUNTER — Telehealth: Payer: Self-pay | Admitting: *Deleted

## 2018-12-21 LAB — CANCER ANTIGEN 27.29: CA 27.29: 21.9 U/mL (ref 0.0–38.6)

## 2018-12-21 NOTE — Telephone Encounter (Signed)
Patient notified per order of Dr. Marin Olp that the tumor marker is down to 69, great job!  Pt appreciative of call and has no questions or concerns at this time.

## 2018-12-21 NOTE — Telephone Encounter (Signed)
-----   Message from Volanda Napoleon, MD sent at 12/21/2018  6:37 AM EDT ----- Call - the tumor marker is down to 21!!!  Great job!!  Laurey Arrow

## 2018-12-27 ENCOUNTER — Encounter: Payer: Self-pay | Admitting: Hematology & Oncology

## 2018-12-27 ENCOUNTER — Telehealth: Payer: Self-pay | Admitting: *Deleted

## 2018-12-27 MED ORDER — METHYLPREDNISOLONE 4 MG PO TBPK: ORAL_TABLET | ORAL | 3 refills | Status: DC

## 2018-12-27 NOTE — Telephone Encounter (Signed)
Pt called regarding rash ( picture sent via mychart). MD reviewed pt concerns and picture of rash. Instructed pt per MD to stop ribociclib, medrol dosepak sent to pt's pharmacy, continue pepcid, take 25mg  of benadryl at night, use hydrocortisone cream for itching. Call in 1 week to update MD on Rash and discuss any concerns. Pt verbalized understanding. No further concerns.

## 2018-12-28 ENCOUNTER — Encounter: Payer: Self-pay | Admitting: *Deleted

## 2018-12-28 NOTE — Progress Notes (Signed)
Called to check on patient after she called in yesterday with worsening rash. She is doing better after starting a medrol dose pack yesterday and symptom wise is much improved today. She is anxious about being off the [place] and wants to know whats next. I told her we would wait to see how she responded over the weekend, and I would reach out again Monday and see how she was doing, and then we would talk with Dr Marin Olp about plan moving forward. She was agreeable and happy with this plan. She knows if she develops any mouth swelling/tingling to seek emergent care.  Will follow with patient on Monday

## 2018-12-31 ENCOUNTER — Encounter: Payer: Self-pay | Admitting: Hematology & Oncology

## 2018-12-31 ENCOUNTER — Telehealth: Payer: Self-pay | Admitting: *Deleted

## 2018-12-31 NOTE — Telephone Encounter (Signed)
Patient calling with update on rash - picture also sent via My Chart  Rash is still present, although patient states its improved. The only area that she still has discomfort with is her arms (pictured). Her arms are still slightly itchy. Her last dose of Kisqali was Thursday. Her last dose of her Medrol Dose pack will be tomorrow.   Reviewed with Dr Marin Olp. Patient is to continue medrol dose pack with no changes to the treatment plan. Patient is to notify the office when the rash clears for Dr Marin Olp to determine next course of action. She is to also call the office if the rash gets worse.   Patient understands instructions.

## 2019-01-07 ENCOUNTER — Telehealth: Payer: Self-pay

## 2019-01-07 NOTE — Telephone Encounter (Signed)
Received call from pt with update on rash. Per pt, finished her Medrol Dose Pak on 6/30. She also stopped Pepcid at this time. At that time the rash on her back was "better," but her arms & chest had not worsened or improved.  On 7/3, "different" rash that had previously been treated on legs returned. This area was "omly spots and no itching." Still no change to arms and chest.  7/4 pt decided to restart Pepcid. Since then, the rash to arms & chest have improved but the legs remain the same.  Patient questions what to do at this time.   Per Dr Marin Olp, continue Pepcid and "just give it a little more time" to resolve. Pt verbalizes understanding and agrees with plan. Aware to contact office with update on rash & keep appts as scheduled. dph

## 2019-01-14 ENCOUNTER — Telehealth: Payer: Self-pay | Admitting: *Deleted

## 2019-01-14 NOTE — Telephone Encounter (Signed)
Call received from patient to update Dr. Marin Olp on her rash.  Pt states that rash is "90% better" and that she would like to stop taking Pepcid at this time.  Pt instructed to call office back if rash worsens with stopping Pepcid.  Pt appreciative of assistance and verbalizes an understanding to call office back with any new complaints.

## 2019-01-24 ENCOUNTER — Other Ambulatory Visit: Payer: Self-pay

## 2019-01-24 ENCOUNTER — Encounter: Payer: Self-pay | Admitting: *Deleted

## 2019-01-24 ENCOUNTER — Inpatient Hospital Stay (HOSPITAL_BASED_OUTPATIENT_CLINIC_OR_DEPARTMENT_OTHER): Payer: Medicare Other | Admitting: Hematology & Oncology

## 2019-01-24 ENCOUNTER — Inpatient Hospital Stay: Payer: Medicare Other | Attending: Hematology & Oncology

## 2019-01-24 ENCOUNTER — Encounter: Payer: Self-pay | Admitting: Hematology & Oncology

## 2019-01-24 VITALS — BP 132/60 | HR 72 | Temp 98.6°F | Resp 18 | Ht 66.0 in | Wt 134.0 lb

## 2019-01-24 DIAGNOSIS — Z79811 Long term (current) use of aromatase inhibitors: Secondary | ICD-10-CM | POA: Insufficient documentation

## 2019-01-24 DIAGNOSIS — Z170 Estrogen receptor positive status [ER+]: Secondary | ICD-10-CM

## 2019-01-24 DIAGNOSIS — C50911 Malignant neoplasm of unspecified site of right female breast: Secondary | ICD-10-CM

## 2019-01-24 DIAGNOSIS — C50312 Malignant neoplasm of lower-inner quadrant of left female breast: Secondary | ICD-10-CM

## 2019-01-24 DIAGNOSIS — Z79899 Other long term (current) drug therapy: Secondary | ICD-10-CM

## 2019-01-24 DIAGNOSIS — Z9049 Acquired absence of other specified parts of digestive tract: Secondary | ICD-10-CM

## 2019-01-24 LAB — CMP (CANCER CENTER ONLY)
ALT: 13 U/L (ref 0–44)
AST: 18 U/L (ref 15–41)
Albumin: 4.1 g/dL (ref 3.5–5.0)
Alkaline Phosphatase: 72 U/L (ref 38–126)
Anion gap: 7 (ref 5–15)
BUN: 19 mg/dL (ref 8–23)
CO2: 30 mmol/L (ref 22–32)
Calcium: 8.6 mg/dL — ABNORMAL LOW (ref 8.9–10.3)
Chloride: 103 mmol/L (ref 98–111)
Creatinine: 0.9 mg/dL (ref 0.44–1.00)
GFR, Est AFR Am: >60 mL/min (ref >60)
GFR, Estimated: >60 mL/min (ref >60)
Glucose, Bld: 99 mg/dL (ref 70–99)
Potassium: 4 mmol/L (ref 3.5–5.1)
Sodium: 140 mmol/L (ref 135–145)
Total Bilirubin: 0.3 mg/dL (ref 0.3–1.2)
Total Protein: 6.4 g/dL — ABNORMAL LOW (ref 6.5–8.1)

## 2019-01-24 LAB — CBC WITH DIFFERENTIAL (CANCER CENTER ONLY)
Abs Immature Granulocytes: 0.03 10*3/uL (ref 0.00–0.07)
Basophils Absolute: 0.1 10*3/uL (ref 0.0–0.1)
Basophils Relative: 1 %
Eosinophils Absolute: 0.1 10*3/uL (ref 0.0–0.5)
Eosinophils Relative: 2 %
HCT: 38 % (ref 36.0–46.0)
Hemoglobin: 12.3 g/dL (ref 12.0–15.0)
Immature Granulocytes: 0 %
Lymphocytes Relative: 23 %
Lymphs Abs: 1.8 10*3/uL (ref 0.7–4.0)
MCH: 30.8 pg (ref 26.0–34.0)
MCHC: 32.4 g/dL (ref 30.0–36.0)
MCV: 95.2 fL (ref 80.0–100.0)
Monocytes Absolute: 0.6 10*3/uL (ref 0.1–1.0)
Monocytes Relative: 8 %
Neutro Abs: 5 10*3/uL (ref 1.7–7.7)
Neutrophils Relative %: 66 %
Platelet Count: 333 10*3/uL (ref 150–400)
RBC: 3.99 MIL/uL (ref 3.87–5.11)
RDW: 18.3 % — ABNORMAL HIGH (ref 11.5–15.5)
WBC Count: 7.7 10*3/uL (ref 4.0–10.5)
nRBC: 0 % (ref 0.0–0.2)

## 2019-01-24 NOTE — Progress Notes (Signed)
Hematology and Oncology Follow Up Visit  Martha Osborne [id number] 02-20-1951 68 y.o. 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 daily  Ribociclib 600 mg p.o. daily (21/7) -- d/c due to skin rash  Verzenio 150 mg po q day -- start 01/24/2019     Interim History:  Martha Osborne is back for follow-up.  She did have surgery for the colonic metastasis.  She underwent a partial colectomy on 09/14/2018.  The pathology report ([id number]) showed 2 foci of metastatic lobular carcinoma.  She had 11 of 20+ lymph nodes.  All margins were negative.  Her appendix was negative for cancer.  We did do molecular studies.  Her tumor is ER positive and PR positive.  It is HER-2 negative.  We checked her for BRCA which was also negative.  She had low TMB.  The tumor was MSI stable.  She was negative for PD-L1.  Also she was negative for mutation in PIK3CA.  At this time, we will have her get off the ribociclib.  She really had the tough time with the skin rash.  I do not think that dropping the dose to 400 mg daily would make a difference.  I do have some free samples of Verzenio.  I think we should give this a try.  However, given that she does have a sensitivity to medications, I will have her try Verzenio at 150 mg p.o. daily.  Her last CA 27.29 was improving.  It was down to 22.  She otherwise is doing okay.  She has been very active.  She has a garden.  She does walking.  She has had no nausea or vomiting.  She is worried about not gaining weight.  I told her that the weight will come back on her over time.  She has had no cough.  She has had no bleeding.  There is been no diarrhea.  I told her with Verzenio, she may have some diarrhea.    Overall, her performance status is ECOG 0.  Medications:  Current Outpatient Medications:  .   Cholecalciferol (VITAMIN D3 PO), Take 2,000 Units by mouth daily., Disp: , Rfl:  .  letrozole (FEMARA) 2.5 MG tablet, Take 1 tablet (2.5 mg total) by mouth daily., Disp: 30 tablet, Rfl: 12 .  psyllium (METAMUCIL) 58.6 % powder, Take 1 packet by mouth daily as needed., Disp: , Rfl:  .  ribociclib succ 200 MG TBPK, Take 600 mg by mouth daily. Take 3 capsules daily for 21 days on then 7 days off. (Patient not taking: Reported on 01/24/2019), Disp: 67 each, Rfl: 6  Allergies: No Known Allergies  Past Medical History, Surgical history, Social history, and Family History were reviewed and updated.  Review of Systems: Review of Systems  Constitutional: Negative.   HENT:  Negative.   Eyes: Negative.   Respiratory: Negative.   Cardiovascular: Negative.   Gastrointestinal: Positive for abdominal pain.  Endocrine: Negative.   Genitourinary: Negative.    Musculoskeletal: Negative.   Skin: Negative.   Neurological: Negative.   Hematological: Negative.   Psychiatric/Behavioral: Negative.     Physical Exam:  height is '5\' 6"'  (1.676 m) and weight is 134 lb (60.8 kg). Her temporal temperature is 98.6 F (37 C). Her blood pressure is 132/60 and her pulse is 72. Her respiration is 18 and oxygen saturation  is 100%.   Wt Readings from Last 3 Encounters:  01/24/19 134 lb (60.8 kg)  12/20/18 136 lb (61.7 kg)  11/15/18 132 lb (59.9 kg)    Physical Exam Vitals signs reviewed.  Constitutional:      Comments: Breast exam bilaterally shows no breast masses.  She has no nipple discharge bilaterally.  She has no breast swelling or erythema.  There is no bilateral axillary adenopathy.  HENT:     Head: Normocephalic and atraumatic.  Eyes:     Pupils: Pupils are equal, round, and reactive to light.  Neck:     Musculoskeletal: Normal range of motion.  Cardiovascular:     Rate and Rhythm: Normal rate and regular rhythm.     Heart sounds: Normal heart sounds.  Pulmonary:     Effort: Pulmonary effort is  normal.     Breath sounds: Normal breath sounds.  Abdominal:     General: Bowel sounds are normal.     Palpations: Abdomen is soft.     Comments: Abdominal exam shows the healing laparotomy scar.  She has no swelling.  There is no erythema about the surgical site.  She has been tenderness to palpation in the right lower quadrant.  No masses noted.  Bowel sounds are present.  There is no palpable liver or spleen tip.  Musculoskeletal: Normal range of motion.        General: No tenderness or deformity.  Lymphadenopathy:     Cervical: No cervical adenopathy.  Skin:    General: Skin is warm and dry.     Findings: No erythema or rash.  Neurological:     Mental Status: She is alert and oriented to person, place, and time.  Psychiatric:        Behavior: Behavior normal.        Thought Content: Thought content normal.        Judgment: Judgment normal.      Lab Results  Component Value Date   WBC 7.7 01/24/2019   HGB 12.3 01/24/2019   HCT 38.0 01/24/2019   MCV 95.2 01/24/2019   PLT 333 01/24/2019     Chemistry      Component Value Date/Time   NA 140 01/24/2019 1453   K 4.0 01/24/2019 1453   CL 103 01/24/2019 1453   CO2 30 01/24/2019 1453   BUN 19 01/24/2019 1453   CREATININE 0.90 01/24/2019 1453      Component Value Date/Time   CALCIUM 8.6 (L) 01/24/2019 1453   ALKPHOS 72 01/24/2019 1453   AST 18 01/24/2019 1453   ALT 13 01/24/2019 1453   BILITOT 0.3 01/24/2019 1453       Impression and Plan: Martha Osborne is a 68 year old postmenopausal female.  She has metastatic lobular carcinoma of the left breast.  We finally found their primary after doing a breast MRI.  She had resection of the colonic mass.  She had multiple positive lymph nodes.  We will see how she does with the Verzenio.  Hopefully, the daily dose will help her and will be effective and not cause side effects.  I would like to have her come back in a month so we can see how she is doing.  At some point,  probably in September, we will repeat her scans and see how everything looks.  I had to spend about half hour with her today since we had to make the change in medications.  I did talk about the side effects of the Verzenio.  Her husband was on the cell phone listening.  I answered all their questions.  I went over all of her lab work.     Volanda Napoleon, MD 7/23/20203:47 PM

## 2019-01-25 ENCOUNTER — Encounter: Payer: Self-pay | Admitting: *Deleted

## 2019-01-25 LAB — LACTATE DEHYDROGENASE: LDH: 134 U/L (ref 98–192)

## 2019-01-25 LAB — CANCER ANTIGEN 27.29: CA 27.29: 20.2 U/mL (ref 0.0–38.6)

## 2019-01-25 MED ORDER — ABEMACICLIB 150 MG PO TABS: 150.0000 mg | ORAL_TABLET | Freq: Every day | ORAL | 0 refills | Status: DC

## 2019-01-25 MED ORDER — ABEMACICLIB 150 MG PO TABS: 150.0000 mg | ORAL_TABLET | Freq: Two times a day (BID) | ORAL | 0 refills | Status: DC

## 2019-01-25 NOTE — Progress Notes (Signed)
Reviewed patient visit. She will start Verzenio. She has a one month supply. Notified Alyson in the [facility] of new start.   Called patient and gave her message below:  Volanda Napoleon, MD  P Onc Nurse Hp        Call - the tumor level is still low and stable -- 20 today -- was 22. This is great news!!! Laurey Arrow    Also encouraged patient to call with any problems or side effects. We will assess her tolerance at the next appointment and then investigate financial help for the Verzenio if moving forward. Patient understood all the above and will reach out if needed.

## 2019-01-28 DIAGNOSIS — H2513 Age-related nuclear cataract, bilateral: Secondary | ICD-10-CM | POA: Diagnosis not present

## 2019-01-28 DIAGNOSIS — H43391 Other vitreous opacities, right eye: Secondary | ICD-10-CM | POA: Diagnosis not present

## 2019-01-28 DIAGNOSIS — H43812 Vitreous degeneration, left eye: Secondary | ICD-10-CM | POA: Diagnosis not present

## 2019-01-28 MED FILL — LETROZOLE 2.5 MG TABLET: 2.5 | 30 days supply | Qty: 30 | Fill #3

## 2019-02-21 MED FILL — LETROZOLE 2.5 MG TABLET: 2.5 | 30 days supply | Qty: 30 | Fill #4

## 2019-03-01 ENCOUNTER — Encounter: Payer: Self-pay | Admitting: Hematology & Oncology

## 2019-03-01 ENCOUNTER — Other Ambulatory Visit: Payer: Self-pay

## 2019-03-01 ENCOUNTER — Encounter: Payer: Self-pay | Admitting: *Deleted

## 2019-03-01 ENCOUNTER — Inpatient Hospital Stay: Payer: Medicare Other

## 2019-03-01 ENCOUNTER — Inpatient Hospital Stay: Payer: Medicare Other | Attending: Hematology & Oncology | Admitting: Hematology & Oncology

## 2019-03-01 VITALS — BP 127/58 | HR 68 | Temp 98.4°F | Resp 16 | Wt 135.0 lb

## 2019-03-01 DIAGNOSIS — Z170 Estrogen receptor positive status [ER+]: Secondary | ICD-10-CM | POA: Diagnosis not present

## 2019-03-01 DIAGNOSIS — C50312 Malignant neoplasm of lower-inner quadrant of left female breast: Secondary | ICD-10-CM | POA: Insufficient documentation

## 2019-03-01 DIAGNOSIS — Z9049 Acquired absence of other specified parts of digestive tract: Secondary | ICD-10-CM | POA: Diagnosis not present

## 2019-03-01 DIAGNOSIS — Z79811 Long term (current) use of aromatase inhibitors: Secondary | ICD-10-CM | POA: Diagnosis not present

## 2019-03-01 DIAGNOSIS — Z79899 Other long term (current) drug therapy: Secondary | ICD-10-CM | POA: Insufficient documentation

## 2019-03-01 DIAGNOSIS — Z780 Asymptomatic menopausal state: Secondary | ICD-10-CM | POA: Diagnosis not present

## 2019-03-01 DIAGNOSIS — C50911 Malignant neoplasm of unspecified site of right female breast: Secondary | ICD-10-CM

## 2019-03-01 DIAGNOSIS — C785 Secondary malignant neoplasm of large intestine and rectum: Secondary | ICD-10-CM

## 2019-03-01 DIAGNOSIS — R109 Unspecified abdominal pain: Secondary | ICD-10-CM | POA: Diagnosis not present

## 2019-03-01 LAB — CBC WITH DIFFERENTIAL (CANCER CENTER ONLY)
Abs Immature Granulocytes: 0.01 10*3/uL (ref 0.00–0.07)
Basophils Absolute: 0.1 10*3/uL (ref 0.0–0.1)
Basophils Relative: 1 %
Eosinophils Absolute: 0 10*3/uL (ref 0.0–0.5)
Eosinophils Relative: 1 %
HCT: 36.7 % (ref 36.0–46.0)
Hemoglobin: 11.9 g/dL — ABNORMAL LOW (ref 12.0–15.0)
Immature Granulocytes: 0 %
Lymphocytes Relative: 30 %
Lymphs Abs: 1.6 10*3/uL (ref 0.7–4.0)
MCH: 31.8 pg (ref 26.0–34.0)
MCHC: 32.4 g/dL (ref 30.0–36.0)
MCV: 98.1 fL (ref 80.0–100.0)
Monocytes Absolute: 0.3 10*3/uL (ref 0.1–1.0)
Monocytes Relative: 5 %
Neutro Abs: 3.3 10*3/uL (ref 1.7–7.7)
Neutrophils Relative %: 63 %
Platelet Count: 240 10*3/uL (ref 150–400)
RBC: 3.74 MIL/uL — ABNORMAL LOW (ref 3.87–5.11)
RDW: 15.7 % — ABNORMAL HIGH (ref 11.5–15.5)
WBC Count: 5.3 10*3/uL (ref 4.0–10.5)
nRBC: 0 % (ref 0.0–0.2)

## 2019-03-01 LAB — CMP (CANCER CENTER ONLY)
ALT: 13 U/L (ref 0–44)
AST: 19 U/L (ref 15–41)
Albumin: 4 g/dL (ref 3.5–5.0)
Alkaline Phosphatase: 70 U/L (ref 38–126)
Anion gap: 7 (ref 5–15)
BUN: 17 mg/dL (ref 8–23)
CO2: 29 mmol/L (ref 22–32)
Calcium: 9.3 mg/dL (ref 8.9–10.3)
Chloride: 101 mmol/L (ref 98–111)
Creatinine: 0.97 mg/dL (ref 0.44–1.00)
GFR, Est AFR Am: >60 mL/min (ref >60)
GFR, Estimated: >60 mL/min (ref >60)
Glucose, Bld: 119 mg/dL — ABNORMAL HIGH (ref 70–99)
Potassium: 4 mmol/L (ref 3.5–5.1)
Sodium: 137 mmol/L (ref 135–145)
Total Bilirubin: 0.4 mg/dL (ref 0.3–1.2)
Total Protein: 6.7 g/dL (ref 6.5–8.1)

## 2019-03-01 MED ORDER — ABEMACICLIB 200 MG PO TABS: 200.0000 mg | ORAL_TABLET | Freq: Every day | ORAL | Status: DC

## 2019-03-01 NOTE — Progress Notes (Signed)
Patient is here for a follow up approximately 1 month after starting Verzenio. She states she feels so much better than she did on Kisqali. She denies any side effects from the Verzenio. She states she feels as close to baseline as she has thought possible.   She will discuss her experience the last month with Dr Marin Olp, and create a plan on treatment moving forward.   Patient visited with Dr Marin Olp. The Verzenio will be continued. Message sent to our specialty pharmacy to notify them of the continuation of Verzenio so that they can continue to work on financial and insurance support.

## 2019-03-01 NOTE — Progress Notes (Signed)
Hematology and Oncology Follow Up Visit  Martha Osborne [id number] [birth year] 68 y.o. 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 daily  Ribociclib 600 mg p.o. daily (21/7) -- d/c due to skin rash  Verzenio 200 mg po q day --  Changed on 03/01/2019     Interim History:  Martha Osborne is back for follow-up.  She did have surgery for the colonic metastasis.  She underwent a partial colectomy on 09/14/2018.  The pathology report ([id number]) showed 2 foci of metastatic lobular carcinoma.  She had 11 of 20+ lymph nodes.  All margins were negative.  Her appendix was negative for cancer.  We did  molecular studies.  Her tumor is ER positive and PR positive.  It is HER-2 negative.  We checked her for BRCA which was also negative.  She had low TMB.  The tumor was MSI stable.  She was negative for PD-L1.  Also she was negative for mutation in PIK3CA.  She is done well with the Verzenio.  We have her on Verzenio samples.  I will increase her Verzenio to 200 mg daily now.  She has had no problems with nausea or vomiting.  She is been very active.  She has a garden.  She likes to be outside.  She has had no issues with bowels or bladder.  She has had no problems with rashes.  There is been no leg swelling.  She has had no fever.  She has had no cough.  We will have to get her set up with some scans to see how things are looking.  Her last CA 27.29 was 20.  Overall, I would say performance status is ECOG 0.    Medications:  Current Outpatient Medications:  .  abemaciclib (VERZENIO) 150 MG tablet, Take 1 tablet (150 mg total) by mouth daily. Swallow tablets whole. Do not chew, crush, or split tablets before swallowing., Disp: 60 tablet, Rfl: 0 .  Cholecalciferol (VITAMIN D3 PO), Take 2,000 Units by mouth daily., Disp: , Rfl:  .  letrozole  (FEMARA) 2.5 MG tablet, Take 1 tablet (2.5 mg total) by mouth daily., Disp: 30 tablet, Rfl: 12 .  psyllium (METAMUCIL) 58.6 % powder, Take 1 packet by mouth daily as needed., Disp: , Rfl:   Allergies: No Known Allergies  Past Medical History, Surgical history, Social history, and Family History were reviewed and updated.  Review of Systems: Review of Systems  Constitutional: Negative.   HENT:  Negative.   Eyes: Negative.   Respiratory: Negative.   Cardiovascular: Negative.   Gastrointestinal: Positive for abdominal pain.  Endocrine: Negative.   Genitourinary: Negative.    Musculoskeletal: Negative.   Skin: Negative.   Neurological: Negative.   Hematological: Negative.   Psychiatric/Behavioral: Negative.     Physical Exam:  weight is 135 lb (61.2 kg). Her temporal temperature is 98.4 F (36.9 C). Her blood pressure is 127/58 (abnormal) and her pulse is 68. Her respiration is 16 and oxygen saturation is 100%.   Wt Readings from Last 3 Encounters:  03/01/19 135 lb (61.2 kg)  01/24/19 134 lb (60.8 kg)  12/20/18 136 lb (61.7 kg)    Physical Exam Vitals signs reviewed.  Constitutional:      Comments: Breast exam bilaterally shows no breast masses.  She has no nipple discharge bilaterally.  She has no breast  swelling or erythema.  There is no bilateral axillary adenopathy.  HENT:     Head: Normocephalic and atraumatic.  Eyes:     Pupils: Pupils are equal, round, and reactive to light.  Neck:     Musculoskeletal: Normal range of motion.  Cardiovascular:     Rate and Rhythm: Normal rate and regular rhythm.     Heart sounds: Normal heart sounds.  Pulmonary:     Effort: Pulmonary effort is normal.     Breath sounds: Normal breath sounds.  Abdominal:     General: Bowel sounds are normal.     Palpations: Abdomen is soft.     Comments: Abdominal exam shows the healing laparotomy scar.  She has no swelling.  There is no erythema about the surgical site.  She has been tenderness  to palpation in the right lower quadrant.  No masses noted.  Bowel sounds are present.  There is no palpable liver or spleen tip.  Musculoskeletal: Normal range of motion.        General: No tenderness or deformity.  Lymphadenopathy:     Cervical: No cervical adenopathy.  Skin:    General: Skin is warm and dry.     Findings: No erythema or rash.  Neurological:     Mental Status: She is alert and oriented to person, place, and time.  Psychiatric:        Behavior: Behavior normal.        Thought Content: Thought content normal.        Judgment: Judgment normal.      Lab Results  Component Value Date   WBC 5.3 03/01/2019   HGB 11.9 (L) 03/01/2019   HCT 36.7 03/01/2019   MCV 98.1 03/01/2019   PLT 240 03/01/2019     Chemistry      Component Value Date/Time   NA 137 03/01/2019 1329   K 4.0 03/01/2019 1329   CL 101 03/01/2019 1329   CO2 29 03/01/2019 1329   BUN 17 03/01/2019 1329   CREATININE 0.97 03/01/2019 1329      Component Value Date/Time   CALCIUM 9.3 03/01/2019 1329   ALKPHOS 70 03/01/2019 1329   AST 19 03/01/2019 1329   ALT 13 03/01/2019 1329   BILITOT 0.4 03/01/2019 1329       Impression and Plan: Martha Osborne is a 68 year old postmenopausal female.  She has metastatic lobular carcinoma of the left breast.  We finally found their primary after doing a breast MRI.  She had resection of the colonic mass.  She had multiple positive lymph nodes.  We will see how she does with the Verzenio.  Hopefully, the daily dose will help her and will be effective and not cause side effects.  I will set her up with her PET scan.  I think this is a good test for [place].  Hopefully will not find any evidence of active disease.  I am just glad her quality of life is good.  I am also happy that we have samples that we can give her so that she will not have to spend a couple thousand dollars a month.  We will get her back to see as have her PET scan is done.       Volanda Napoleon,  MD 8/28/20202:31 PM

## 2019-03-02 LAB — CANCER ANTIGEN 27.29: CA 27.29: 20.9 U/mL (ref 0.0–38.6)

## 2019-03-04 ENCOUNTER — Telehealth: Payer: Self-pay | Admitting: Hematology & Oncology

## 2019-03-04 LAB — LACTATE DEHYDROGENASE: LDH: 93 U/L — ABNORMAL LOW (ref 98–192)

## 2019-03-04 NOTE — Telephone Encounter (Signed)
lmom to inform patient of Oct appts per 8/28 LOS

## 2019-03-05 ENCOUNTER — Other Ambulatory Visit: Payer: Self-pay | Admitting: *Deleted

## 2019-03-05 ENCOUNTER — Encounter: Payer: Self-pay | Admitting: *Deleted

## 2019-03-05 ENCOUNTER — Telehealth: Payer: Self-pay | Admitting: Hematology & Oncology

## 2019-03-05 MED ORDER — ABEMACICLIB 100 MG PO TABS: 100.0000 mg | ORAL_TABLET | Freq: Two times a day (BID) | ORAL | 3 refills | Status: DC

## 2019-03-05 NOTE — Telephone Encounter (Signed)
Called and spoke with patient regarding Pet Scan appointment and instructions given

## 2019-03-06 ENCOUNTER — Encounter: Payer: Self-pay | Admitting: *Deleted

## 2019-03-06 NOTE — Progress Notes (Signed)
Patient is continuing on Verzenio. She received samples which Dr Marin Olp had on hand, but dosing was 200mg  tablets. Her eventual dosing will be 100mg  BID. She is concerned that if she is supposed to be on 100mg  BID, that she should go ahead and initiate that dosing. She understands that Dr Marin Olp altered her dosing schedule to allow for use of samples; Her out of pocket cost for Verzenio is several hundred dollars and he was trying to help save her that cost.  Spoke to patient at length. She is appreciative of Dr Antonieta Pert attempt to save her out of pocket expense but states she can afford the co-payment. Since she has a supply at home, which she doesn't want to waste, she will continue on the 200mg  daily until she is done. At that time she will start her own prescription and doesn't want any further samples. Told her that plan was good and I would pass the information onto Dr Marin Olp.

## 2019-03-14 ENCOUNTER — Encounter: Payer: Self-pay | Admitting: *Deleted

## 2019-03-14 ENCOUNTER — Other Ambulatory Visit: Payer: Self-pay | Admitting: Hematology & Oncology

## 2019-03-14 DIAGNOSIS — C50911 Malignant neoplasm of unspecified site of right female breast: Secondary | ICD-10-CM

## 2019-03-14 NOTE — Progress Notes (Signed)
Mri breast

## 2019-03-14 NOTE — Progress Notes (Signed)
Patient is calling to follow up on radiology orders. She states she has her PET scan scheduled, but she has not heard anything about her Breast MRI.  Reviewed the chart and relayed to patient that Dr Marin Olp is only following with PET scans at this time. That PET scans detect activity and are generally superior scans when it comes to surveillance. Patient is unhappy with this response. She states the PET scan she had prior didn't detect anything in her breast and the MRI is what found the masses.  Reviewed patient concern with Dr Marin Olp. He will order a MRI of the Breasts and see if insurance will cover. She may need to wait until one year has passed, depending on insurance review.   Called patient and let her know that order will be placed and she should expect a phone call next week to schedule the scan. She was appreciative of the information.

## 2019-03-21 ENCOUNTER — Encounter: Payer: Self-pay | Admitting: *Deleted

## 2019-03-26 ENCOUNTER — Telehealth: Payer: Self-pay | Admitting: *Deleted

## 2019-03-26 NOTE — Telephone Encounter (Signed)
Call received from patient stating that she started on Verzenio on 01/25/19 and felt "great" for the first six weeks.  She states that since she increased her dose from the 150 mg dose to the 200 mg dose on 03/22/19, that she has been lightheaded at times and also has increased fatigue and nausea. Pt states that she is eating and drinking without difficulty. Notified pt that Dr. Marin Olp is out of the office this afternoon and that we would call her back with MD orders when Dr. Marin Olp returns to the office tomorrow.  Patient appreciative of assistance and has no other questions or concerns at this time.

## 2019-03-27 ENCOUNTER — Ambulatory Visit (HOSPITAL_COMMUNITY)
Admission: RE | Admit: 2019-03-27 | Discharge: 2019-03-27 | Disposition: A | Discharge status: Home / Self Care | Payer: Medicare Other | Source: Ambulatory Visit | Attending: Hematology & Oncology | Admitting: Hematology & Oncology

## 2019-03-27 ENCOUNTER — Other Ambulatory Visit: Payer: Self-pay

## 2019-03-27 DIAGNOSIS — C50919 Malignant neoplasm of unspecified site of unspecified female breast: Secondary | ICD-10-CM | POA: Diagnosis not present

## 2019-03-27 DIAGNOSIS — C785 Secondary malignant neoplasm of large intestine and rectum: Secondary | ICD-10-CM | POA: Diagnosis not present

## 2019-03-27 DIAGNOSIS — C50911 Malignant neoplasm of unspecified site of right female breast: Secondary | ICD-10-CM | POA: Insufficient documentation

## 2019-03-27 DIAGNOSIS — Z79899 Other long term (current) drug therapy: Secondary | ICD-10-CM | POA: Insufficient documentation

## 2019-03-27 DIAGNOSIS — I700 Atherosclerosis of aorta: Secondary | ICD-10-CM | POA: Insufficient documentation

## 2019-03-27 LAB — GLUCOSE, CAPILLARY: Glucose-Capillary: 98 mg/dL (ref 70–99)

## 2019-03-27 MED ORDER — FLUDEOXYGLUCOSE F - 18 (FDG) INJECTION
6.7000 | Freq: Once | INTRAVENOUS | Status: AC | PRN
  Administered 2019-03-27: 11:00:00 6.7 via INTRAVENOUS

## 2019-03-28 MED FILL — LETROZOLE 2.5 MG TABLET: 2.5 | 30 days supply | Qty: 30 | Fill #5

## 2019-03-29 ENCOUNTER — Encounter: Payer: Self-pay | Admitting: *Deleted

## 2019-03-29 NOTE — Progress Notes (Signed)
Patient called and given the following message:  Notes recorded by Volanda Napoleon, MD on 03/27/2019 at 2:37 PM EDT  Call - NO active breast cancer!! Great job!! Pete  Her MRI is this weekend and she is anxious for the results. Told her I would try to reach out to her Monday with results. She appreciated the information.

## 2019-03-31 ENCOUNTER — Other Ambulatory Visit: Payer: Self-pay

## 2019-03-31 ENCOUNTER — Ambulatory Visit
Admission: RE | Admit: 2019-03-31 | Discharge: 2019-03-31 | Disposition: A | Discharge status: Home / Self Care | Payer: Medicare Other | Source: Ambulatory Visit | Attending: Hematology & Oncology | Admitting: Hematology & Oncology

## 2019-03-31 DIAGNOSIS — C50919 Malignant neoplasm of unspecified site of unspecified female breast: Secondary | ICD-10-CM | POA: Diagnosis not present

## 2019-03-31 DIAGNOSIS — C50911 Malignant neoplasm of unspecified site of right female breast: Secondary | ICD-10-CM

## 2019-03-31 DIAGNOSIS — C785 Secondary malignant neoplasm of large intestine and rectum: Secondary | ICD-10-CM | POA: Diagnosis not present

## 2019-03-31 MED ORDER — GADOBUTROL 1 MMOL/ML IV SOLN
6.0000 mL | Freq: Once | INTRAVENOUS | Status: AC | PRN
  Administered 2019-03-31: 6 mL via INTRAVENOUS

## 2019-04-01 ENCOUNTER — Encounter: Payer: Self-pay | Admitting: *Deleted

## 2019-04-01 NOTE — Progress Notes (Signed)
Called patient with MRI results.   Patient mentions a new side effect that has started in the last 10 days which she wonders could be related to [employer]. She has had some scalp tenderness that comes and goes. It's most noticeable when taking a shower or brushing her hair. She says it changes locations. It feels like "I bumped my head or bruised it, but I know I didn't"  Reviewed with Pharmacist, Emeline Darling, and Dr Marin Olp. No clear connection to [employer]. Patient can continue to monitor and inform us if the symptom becomes worse. Patient aware of suggestion.

## 2019-04-05 ENCOUNTER — Other Ambulatory Visit: Payer: Self-pay

## 2019-04-05 ENCOUNTER — Inpatient Hospital Stay: Payer: Medicare Other | Attending: Hematology & Oncology | Admitting: Hematology & Oncology

## 2019-04-05 ENCOUNTER — Inpatient Hospital Stay: Payer: Medicare Other

## 2019-04-05 ENCOUNTER — Encounter: Payer: Self-pay | Admitting: *Deleted

## 2019-04-05 VITALS — BP 118/45 | HR 69 | Temp 97.1°F | Resp 16 | Wt 135.0 lb

## 2019-04-05 DIAGNOSIS — M1712 Unilateral primary osteoarthritis, left knee: Secondary | ICD-10-CM | POA: Diagnosis not present

## 2019-04-05 DIAGNOSIS — C50911 Malignant neoplasm of unspecified site of right female breast: Secondary | ICD-10-CM | POA: Diagnosis not present

## 2019-04-05 DIAGNOSIS — C50312 Malignant neoplasm of lower-inner quadrant of left female breast: Secondary | ICD-10-CM | POA: Insufficient documentation

## 2019-04-05 DIAGNOSIS — Z79811 Long term (current) use of aromatase inhibitors: Secondary | ICD-10-CM | POA: Insufficient documentation

## 2019-04-05 DIAGNOSIS — R109 Unspecified abdominal pain: Secondary | ICD-10-CM | POA: Insufficient documentation

## 2019-04-05 DIAGNOSIS — Z9049 Acquired absence of other specified parts of digestive tract: Secondary | ICD-10-CM | POA: Diagnosis not present

## 2019-04-05 DIAGNOSIS — C785 Secondary malignant neoplasm of large intestine and rectum: Secondary | ICD-10-CM | POA: Diagnosis not present

## 2019-04-05 DIAGNOSIS — R5383 Other fatigue: Secondary | ICD-10-CM | POA: Diagnosis not present

## 2019-04-05 DIAGNOSIS — Z170 Estrogen receptor positive status [ER+]: Secondary | ICD-10-CM | POA: Diagnosis not present

## 2019-04-05 DIAGNOSIS — Z79899 Other long term (current) drug therapy: Secondary | ICD-10-CM | POA: Insufficient documentation

## 2019-04-05 LAB — CMP (CANCER CENTER ONLY)
ALT: 15 U/L (ref 0–44)
AST: 21 U/L (ref 15–41)
Albumin: 3.9 g/dL (ref 3.5–5.0)
Alkaline Phosphatase: 73 U/L (ref 38–126)
Anion gap: 10 (ref 5–15)
BUN: 17 mg/dL (ref 8–23)
CO2: 26 mmol/L (ref 22–32)
Calcium: 9.3 mg/dL (ref 8.9–10.3)
Chloride: 99 mmol/L (ref 98–111)
Creatinine: 1.08 mg/dL — ABNORMAL HIGH (ref 0.44–1.00)
GFR, Est AFR Am: >60 mL/min (ref >60)
GFR, Estimated: 53 mL/min — ABNORMAL LOW (ref >60)
Glucose, Bld: 114 mg/dL — ABNORMAL HIGH (ref 70–99)
Potassium: 4.3 mmol/L (ref 3.5–5.1)
Sodium: 135 mmol/L (ref 135–145)
Total Bilirubin: 0.3 mg/dL (ref 0.3–1.2)
Total Protein: 7.2 g/dL (ref 6.5–8.1)

## 2019-04-05 LAB — CBC WITH DIFFERENTIAL (CANCER CENTER ONLY)
Abs Immature Granulocytes: 0.02 10*3/uL (ref 0.00–0.07)
Basophils Absolute: 0.1 10*3/uL (ref 0.0–0.1)
Basophils Relative: 1 %
Eosinophils Absolute: 0 10*3/uL (ref 0.0–0.5)
Eosinophils Relative: 1 %
HCT: 39.1 % (ref 36.0–46.0)
Hemoglobin: 12.8 g/dL (ref 12.0–15.0)
Immature Granulocytes: 0 %
Lymphocytes Relative: 29 %
Lymphs Abs: 1.6 10*3/uL (ref 0.7–4.0)
MCH: 32.2 pg (ref 26.0–34.0)
MCHC: 32.7 g/dL (ref 30.0–36.0)
MCV: 98.2 fL (ref 80.0–100.0)
Monocytes Absolute: 0.3 10*3/uL (ref 0.1–1.0)
Monocytes Relative: 5 %
Neutro Abs: 3.5 10*3/uL (ref 1.7–7.7)
Neutrophils Relative %: 64 %
Platelet Count: 190 10*3/uL (ref 150–400)
RBC: 3.98 MIL/uL (ref 3.87–5.11)
RDW: 14.2 % (ref 11.5–15.5)
WBC Count: 5.5 10*3/uL (ref 4.0–10.5)
nRBC: 0 % (ref 0.0–0.2)

## 2019-04-05 NOTE — Progress Notes (Signed)
Patient here for follow up appointment. She has developed more side effects and has a multitude of questions for Dr Marin Olp. She reviewed her symptoms, her thoughts on them and how she's been managing as I actively listened. She also expressed concern about certain symptoms - like a low diastolic blood pressure, that she doesn't feel Dr Marin Olp feels needs follow up. Encouraged patient to talk to her PCP about anything she was experiencing that was not related to her cancer or treatment. She understood.   She has all her questions and concerns written on a piece of paper so that she doesn't forget anything. Of note, her scalp tenderness has gone away.  She know to call with any questions or concerns.

## 2019-04-05 NOTE — Progress Notes (Signed)
Hematology and Oncology Follow Up Visit  Martha Osborne [id number] 1951/06/21 68 y.o. 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 daily  Ribociclib 600 mg p.o. daily (21/7) -- d/c due to skin rash  Verzenio 200 mg po q day --  Changed on 03/01/2019     Interim History:  [name] is back for follow-up.  She did have surgery for the colonic metastasis.  She underwent a partial colectomy on 09/14/2018.  The pathology report ([id number]) showed 2 foci of metastatic lobular carcinoma.  She had 11 of 20+ lymph nodes.  All margins were negative.  Her appendix was negative for cancer.  We did  molecular studies.  Her tumor is ER positive and PR positive.  It is HER-2 negative.  We checked her for BRCA which was also negative.  She had low TMB.  The tumor was MSI stable.  She was negative for PD-L1.  Also she was negative for mutation in PIK3CA.  We did do a MRI of her breasts.  She had a very, very nice response to the treatment so far.  Her left breast mass decreased from 2.1 cm down to 0.8 cm.  I would not think that there is a role for lumpectomy or mastectomy given that she has metastatic disease.  I think that the recent clinical trials have not shown a benefit for local therapy in the face of metastatic disease.  She is having some issues with the Verzenio.  I know she is on the 200 mg dose.  This is free samples for her.  I told her to stop the Verzenio for a week.  Maybe this might help her fatigue.  I then told her to start taking Verzenio 200 mg every other day after that.  She has had no issues with rashes.  She has had some nausea.  I will call in some Zofran for her.  Her last CA 27.29 was still stable at 27.5.   Of note, she also had a PET scan done.  This was done on 03/27/2019.  PET scan did not show any obvious metastatic  disease that was active.  Overall, I would say performance status is ECOG 0.    Medications:  Current Outpatient Medications:  .  abemaciclib (VERZENIO) 100 MG tablet, Take 1 tablet (100 mg total) by mouth 2 (two) times daily. Swallow tablets whole. Do not chew, crush, or split tablets before swallowing., Disp: 60 tablet, Rfl: 3 .  Cholecalciferol (VITAMIN D3 PO), Take 2,000 Units by mouth daily., Disp: , Rfl:  .  letrozole (FEMARA) 2.5 MG tablet, Take 1 tablet (2.5 mg total) by mouth daily., Disp: 30 tablet, Rfl: 12  Allergies: No Known Allergies  Past Medical History, Surgical history, Social history, and Family History were reviewed and updated.  Review of Systems: Review of Systems  Constitutional: Negative.   HENT:  Negative.   Eyes: Negative.   Respiratory: Negative.   Cardiovascular: Negative.   Gastrointestinal: Positive for abdominal pain.  Endocrine: Negative.   Genitourinary: Negative.    Musculoskeletal: Negative.   Skin: Negative.   Neurological: Negative.   Hematological: Negative.   Psychiatric/Behavioral: Negative.     Physical Exam:  weight is 135 lb (61.2 kg). Her temporal temperature is 97.1 F (36.2 C) (abnormal). Her blood pressure is 118/45 (abnormal) and her pulse is 69.  Her respiration is 16 and oxygen saturation is 100%.   Wt Readings from Last 3 Encounters:  04/05/19 135 lb (61.2 kg)  03/01/19 135 lb (61.2 kg)  01/24/19 134 lb (60.8 kg)    Physical Exam Vitals signs reviewed.  Constitutional:      Comments: Breast exam bilaterally shows no breast masses.  She has no nipple discharge bilaterally.  She has no breast swelling or erythema.  There is no bilateral axillary adenopathy.  HENT:     Head: Normocephalic and atraumatic.  Eyes:     Pupils: Pupils are equal, round, and reactive to light.  Neck:     Musculoskeletal: Normal range of motion.  Cardiovascular:     Rate and Rhythm: Normal rate and regular rhythm.     Heart sounds: Normal  heart sounds.  Pulmonary:     Effort: Pulmonary effort is normal.     Breath sounds: Normal breath sounds.  Abdominal:     General: Bowel sounds are normal.     Palpations: Abdomen is soft.     Comments: Abdominal exam shows the healing laparotomy scar.  She has no swelling.  There is no erythema about the surgical site.  She has been tenderness to palpation in the right lower quadrant.  No masses noted.  Bowel sounds are present.  There is no palpable liver or spleen tip.  Musculoskeletal: Normal range of motion.        General: No tenderness or deformity.  Lymphadenopathy:     Cervical: No cervical adenopathy.  Skin:    General: Skin is warm and dry.     Findings: No erythema or rash.  Neurological:     Mental Status: She is alert and oriented to person, place, and time.  Psychiatric:        Behavior: Behavior normal.        Thought Content: Thought content normal.        Judgment: Judgment normal.      Lab Results  Component Value Date   WBC 5.5 04/05/2019   HGB 12.8 04/05/2019   HCT 39.1 04/05/2019   MCV 98.2 04/05/2019   PLT 190 04/05/2019     Chemistry      Component Value Date/Time   NA 135 04/05/2019 1304   K 4.3 04/05/2019 1304   CL 99 04/05/2019 1304   CO2 26 04/05/2019 1304   BUN 17 04/05/2019 1304   CREATININE 1.08 (H) 04/05/2019 1304      Component Value Date/Time   CALCIUM 9.3 04/05/2019 1304   ALKPHOS 73 04/05/2019 1304   AST 21 04/05/2019 1304   ALT 15 04/05/2019 1304   BILITOT 0.3 04/05/2019 1304       Impression and Plan: [name] is a 68 year old postmenopausal female.  She has metastatic lobular carcinoma of the left breast.  We finally found their primary after doing a breast MRI.  She had resection of the colonic mass.  She had multiple positive lymph nodes.  We will see how she does with the change in the Verzenio.  Hopefully, by stopping the Verzenio for a week and then restarting it every other day will be much more amenable to her  quality of life.    For right now, I would like to see her back in about a month.  We will see how she is doing.  Still think she is going to see Dr. Dalbert Batman of surgery.  I think she will hear from him about the feasibility or  the helpfulness of local therapy with a lumpectomy for the left breast issue.   Volanda Napoleon, MD 10/2/20204:11 PM

## 2019-04-06 LAB — CANCER ANTIGEN 27.29: CA 27.29: 27.5 U/mL (ref 0.0–38.6)

## 2019-04-06 MED ORDER — ONDANSETRON HCL 4 MG PO TABS: 4.0000 mg | ORAL_TABLET | Freq: Three times a day (TID) | ORAL | 2 refills | Status: DC | PRN

## 2019-04-08 ENCOUNTER — Telehealth: Payer: Self-pay | Admitting: Hematology & Oncology

## 2019-04-08 ENCOUNTER — Telehealth: Payer: Self-pay | Admitting: *Deleted

## 2019-04-08 NOTE — Telephone Encounter (Signed)
Spoke with patient to confirm 11/4 appts per 10/2 LOS

## 2019-04-08 NOTE — Telephone Encounter (Signed)
Patient notified per order of Dr. Marin Olp that "the tumor marker is still normal at 34. Great job!!"  Pt appreciative of call and has no questions or concerns at this time.

## 2019-04-08 NOTE — Telephone Encounter (Signed)
-----   Message from Volanda Napoleon, MD sent at 04/06/2019 12:17 PM EDT ----- Call -the tumor marker is still normal at 27.  Great job!!  Laurey Arrow

## 2019-04-10 DIAGNOSIS — H8112 Benign paroxysmal vertigo, left ear: Secondary | ICD-10-CM | POA: Diagnosis not present

## 2019-04-10 DIAGNOSIS — H9312 Tinnitus, left ear: Secondary | ICD-10-CM | POA: Diagnosis not present

## 2019-04-17 DIAGNOSIS — C785 Secondary malignant neoplasm of large intestine and rectum: Secondary | ICD-10-CM | POA: Diagnosis not present

## 2019-04-17 DIAGNOSIS — C50312 Malignant neoplasm of lower-inner quadrant of left female breast: Secondary | ICD-10-CM | POA: Diagnosis not present

## 2019-04-26 MED FILL — LETROZOLE 2.5 MG TABLET: 2.5 | 30 days supply | Qty: 30 | Fill #6

## 2019-05-01 DIAGNOSIS — Z23 Encounter for immunization: Secondary | ICD-10-CM | POA: Diagnosis not present

## 2019-05-05 ENCOUNTER — Encounter: Payer: Self-pay | Admitting: *Deleted

## 2019-05-08 ENCOUNTER — Other Ambulatory Visit: Payer: Medicare Other

## 2019-05-08 ENCOUNTER — Ambulatory Visit: Payer: Medicare Other | Admitting: Hematology & Oncology

## 2019-05-16 ENCOUNTER — Inpatient Hospital Stay: Payer: Medicare Other | Attending: Hematology & Oncology

## 2019-05-16 ENCOUNTER — Other Ambulatory Visit: Payer: Self-pay

## 2019-05-16 ENCOUNTER — Encounter: Payer: Self-pay | Admitting: Hematology & Oncology

## 2019-05-16 ENCOUNTER — Inpatient Hospital Stay (HOSPITAL_BASED_OUTPATIENT_CLINIC_OR_DEPARTMENT_OTHER): Payer: Medicare Other | Admitting: Hematology & Oncology

## 2019-05-16 VITALS — BP 136/68 | HR 69 | Temp 97.7°F | Resp 18 | Wt 139.2 lb

## 2019-05-16 DIAGNOSIS — R110 Nausea: Secondary | ICD-10-CM | POA: Insufficient documentation

## 2019-05-16 DIAGNOSIS — Z79899 Other long term (current) drug therapy: Secondary | ICD-10-CM | POA: Insufficient documentation

## 2019-05-16 DIAGNOSIS — C50312 Malignant neoplasm of lower-inner quadrant of left female breast: Secondary | ICD-10-CM | POA: Insufficient documentation

## 2019-05-16 DIAGNOSIS — C50911 Malignant neoplasm of unspecified site of right female breast: Secondary | ICD-10-CM

## 2019-05-16 DIAGNOSIS — Z79811 Long term (current) use of aromatase inhibitors: Secondary | ICD-10-CM | POA: Insufficient documentation

## 2019-05-16 DIAGNOSIS — Z170 Estrogen receptor positive status [ER+]: Secondary | ICD-10-CM | POA: Diagnosis not present

## 2019-05-16 DIAGNOSIS — C785 Secondary malignant neoplasm of large intestine and rectum: Secondary | ICD-10-CM | POA: Insufficient documentation

## 2019-05-16 DIAGNOSIS — R109 Unspecified abdominal pain: Secondary | ICD-10-CM | POA: Insufficient documentation

## 2019-05-16 DIAGNOSIS — Z9049 Acquired absence of other specified parts of digestive tract: Secondary | ICD-10-CM | POA: Insufficient documentation

## 2019-05-16 DIAGNOSIS — M1712 Unilateral primary osteoarthritis, left knee: Secondary | ICD-10-CM

## 2019-05-16 LAB — CMP (CANCER CENTER ONLY)
ALT: 17 U/L (ref 0–44)
AST: 22 U/L (ref 15–41)
Albumin: 4.4 g/dL (ref 3.5–5.0)
Alkaline Phosphatase: 82 U/L (ref 38–126)
Anion gap: 7 (ref 5–15)
BUN: 19 mg/dL (ref 8–23)
CO2: 29 mmol/L (ref 22–32)
Calcium: 9.5 mg/dL (ref 8.9–10.3)
Chloride: 100 mmol/L (ref 98–111)
Creatinine: 0.96 mg/dL (ref 0.44–1.00)
GFR, Est AFR Am: >60 mL/min (ref >60)
GFR, Estimated: >60 mL/min (ref >60)
Glucose, Bld: 115 mg/dL — ABNORMAL HIGH (ref 70–99)
Potassium: 3.9 mmol/L (ref 3.5–5.1)
Sodium: 136 mmol/L (ref 135–145)
Total Bilirubin: 0.3 mg/dL (ref 0.3–1.2)
Total Protein: 6.8 g/dL (ref 6.5–8.1)

## 2019-05-16 LAB — CBC WITH DIFFERENTIAL (CANCER CENTER ONLY)
Abs Immature Granulocytes: 0.01 10*3/uL (ref 0.00–0.07)
Basophils Absolute: 0.1 10*3/uL (ref 0.0–0.1)
Basophils Relative: 1 %
Eosinophils Absolute: 0.1 10*3/uL (ref 0.0–0.5)
Eosinophils Relative: 2 %
HCT: 38.8 % (ref 36.0–46.0)
Hemoglobin: 12.9 g/dL (ref 12.0–15.0)
Immature Granulocytes: 0 %
Lymphocytes Relative: 32 %
Lymphs Abs: 1.8 10*3/uL (ref 0.7–4.0)
MCH: 31.8 pg (ref 26.0–34.0)
MCHC: 33.2 g/dL (ref 30.0–36.0)
MCV: 95.6 fL (ref 80.0–100.0)
Monocytes Absolute: 0.4 10*3/uL (ref 0.1–1.0)
Monocytes Relative: 7 %
Neutro Abs: 3.3 10*3/uL (ref 1.7–7.7)
Neutrophils Relative %: 58 %
Platelet Count: 169 10*3/uL (ref 150–400)
RBC: 4.06 MIL/uL (ref 3.87–5.11)
RDW: 13.2 % (ref 11.5–15.5)
WBC Count: 5.7 10*3/uL (ref 4.0–10.5)
nRBC: 0 % (ref 0.0–0.2)

## 2019-05-16 LAB — VITAMIN D 25 HYDROXY (VIT D DEFICIENCY, FRACTURES): Vit D, 25-Hydroxy: 38.97 ng/mL (ref 30–100)

## 2019-05-16 NOTE — Progress Notes (Signed)
Hematology and Oncology Follow Up Visit  Martha Osborne [id number] [birth year] 68 y.o. 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 daily  Ribociclib 600 mg p.o. daily (21/7) -- d/c due to skin rash  Verzenio 200 mg po q other day --  Changed on 010/15/2020     Interim History:  Martha Osborne is back for follow-up.  She did have surgery for the colonic metastasis.  She underwent a partial colectomy on 09/14/2018.  The pathology report ([id number]) showed 2 foci of metastatic lobular carcinoma.  She had 11 of 20+ lymph nodes.  All margins were negative.  Her appendix was negative for cancer.  We did  molecular studies.  Her tumor is ER positive and PR positive.  It is HER-2 negative.  We checked her for BRCA which was also negative.  She had low TMB.  The tumor was MSI stable.  She was negative for PD-L1.  Also she was negative for mutation in PIK3CA.  Overall, she is still doing quite well.  She is on the Verzenio at 200 mg every other day.  She seems to be tolerating this well.  She has little bit of nausea but she says that this gets better when she eats.  She has had no problems with abdominal pain.  There is no change in bowel or bladder habits.  She has had no cough.  She did see Dr. Dalbert Batman.  He did not feel that there is any role for surgical resection of the breast primary in the left breast.  She is looking forward to Thanksgiving.  Her son and daughter-in-law are coming up from [place].  Overall, her performance status is still quite good at ECOG 0.    Medications:  Current Outpatient Medications:  .  abemaciclib (VERZENIO) 100 MG tablet, Take 1 tablet (100 mg total) by mouth 2 (two) times daily. Swallow tablets whole. Do not chew, crush, or split tablets before swallowing., Disp: 60 tablet, Rfl: 3 .  Cholecalciferol (VITAMIN  D3 PO), Take 2,000 Units by mouth daily., Disp: , Rfl:  .  letrozole (FEMARA) 2.5 MG tablet, Take 1 tablet (2.5 mg total) by mouth daily., Disp: 30 tablet, Rfl: 12 .  ondansetron (ZOFRAN) 4 MG tablet, Take 1 tablet (4 mg total) by mouth every 8 (eight) hours as needed for nausea or vomiting. (Patient not taking: Reported on 05/16/2019), Disp: 20 tablet, Rfl: 2  Allergies: No Known Allergies  Past Medical History, Surgical history, Social history, and Family History were reviewed and updated.  Review of Systems: Review of Systems  Constitutional: Negative.   HENT:  Negative.   Eyes: Negative.   Respiratory: Negative.   Cardiovascular: Negative.   Gastrointestinal: Positive for abdominal pain.  Endocrine: Negative.   Genitourinary: Negative.    Musculoskeletal: Negative.   Skin: Negative.   Neurological: Negative.   Hematological: Negative.   Psychiatric/Behavioral: Negative.     Physical Exam:  weight is 139 lb 4 oz (63.2 kg). Her tympanic temperature is 97.7 F (36.5 C). Her blood pressure is 136/68 and her pulse is 69. Her respiration is 18 and oxygen saturation is 100%.   Wt Readings from Last 3 Encounters:  05/16/19 139 lb 4 oz (63.2 kg)  04/05/19 135 lb (61.2 kg)  03/01/19 135 lb (61.2 kg)    Physical Exam Vitals signs reviewed.  Constitutional:  Comments: Breast exam bilaterally shows no breast masses.  She has no nipple discharge bilaterally.  She has no breast swelling or erythema.  There is no bilateral axillary adenopathy.  HENT:     Head: Normocephalic and atraumatic.  Eyes:     Pupils: Pupils are equal, round, and reactive to light.  Neck:     Musculoskeletal: Normal range of motion.  Cardiovascular:     Rate and Rhythm: Normal rate and regular rhythm.     Heart sounds: Normal heart sounds.  Pulmonary:     Effort: Pulmonary effort is normal.     Breath sounds: Normal breath sounds.  Abdominal:     General: Bowel sounds are normal.     Palpations:  Abdomen is soft.     Comments: Abdominal exam shows the healing laparotomy scar.  She has no swelling.  There is no erythema about the surgical site.  She has been tenderness to palpation in the right lower quadrant.  No masses noted.  Bowel sounds are present.  There is no palpable liver or spleen tip.  Musculoskeletal: Normal range of motion.        General: No tenderness or deformity.  Lymphadenopathy:     Cervical: No cervical adenopathy.  Skin:    General: Skin is warm and dry.     Findings: No erythema or rash.  Neurological:     Mental Status: She is alert and oriented to person, place, and time.  Psychiatric:        Behavior: Behavior normal.        Thought Content: Thought content normal.        Judgment: Judgment normal.      Lab Results  Component Value Date   WBC 5.7 05/16/2019   HGB 12.9 05/16/2019   HCT 38.8 05/16/2019   MCV 95.6 05/16/2019   PLT 169 05/16/2019     Chemistry      Component Value Date/Time   NA 136 05/16/2019 1457   K 3.9 05/16/2019 1457   CL 100 05/16/2019 1457   CO2 29 05/16/2019 1457   BUN 19 05/16/2019 1457   CREATININE 0.96 05/16/2019 1457      Component Value Date/Time   CALCIUM 9.5 05/16/2019 1457   ALKPHOS 82 05/16/2019 1457   AST 22 05/16/2019 1457   ALT 17 05/16/2019 1457   BILITOT 0.3 05/16/2019 1457       Impression and Plan: Martha Osborne is a 68 year old postmenopausal female.  She has metastatic lobular carcinoma of the left breast.  We finally found their primary after doing a breast MRI.  She had resection of the colonic mass.  She had multiple positive lymph nodes.  Hopefully, she will continue to do do well and respond to the [facility].  We still have free samples for her.  Once the free samples run out, I will try to put her on Verzenio at 150 mg p.o. daily.  We will have to follow her CA 27.29.  The CA 27.29 level when we last saw her was 27.  I am just happy that her quality of life is doing so well.  We will  get her back in 4 weeks.  [name], MD

## 2019-05-17 DIAGNOSIS — H9190 Unspecified hearing loss, unspecified ear: Secondary | ICD-10-CM | POA: Diagnosis not present

## 2019-05-17 DIAGNOSIS — R42 Dizziness and giddiness: Secondary | ICD-10-CM | POA: Diagnosis not present

## 2019-05-17 DIAGNOSIS — H903 Sensorineural hearing loss, bilateral: Secondary | ICD-10-CM | POA: Diagnosis not present

## 2019-05-17 DIAGNOSIS — H9312 Tinnitus, left ear: Secondary | ICD-10-CM | POA: Diagnosis not present

## 2019-05-17 LAB — CANCER ANTIGEN 27.29: CA 27.29: 34.5 U/mL (ref 0.0–38.6)

## 2019-05-20 ENCOUNTER — Other Ambulatory Visit: Payer: Self-pay | Admitting: Otolaryngology

## 2019-05-20 DIAGNOSIS — H9190 Unspecified hearing loss, unspecified ear: Secondary | ICD-10-CM

## 2019-05-24 MED FILL — LETROZOLE 2.5 MG TABLET: 2.5 | 30 days supply | Qty: 30 | Fill #7

## 2019-06-12 ENCOUNTER — Other Ambulatory Visit: Payer: Self-pay

## 2019-06-12 ENCOUNTER — Inpatient Hospital Stay: Payer: Medicare Other

## 2019-06-12 ENCOUNTER — Encounter: Payer: Self-pay | Admitting: Hematology & Oncology

## 2019-06-12 ENCOUNTER — Inpatient Hospital Stay: Payer: Medicare Other | Attending: Hematology & Oncology | Admitting: Hematology & Oncology

## 2019-06-12 VITALS — BP 123/48 | HR 74 | Temp 96.0°F | Resp 20 | Wt 137.1 lb

## 2019-06-12 DIAGNOSIS — C785 Secondary malignant neoplasm of large intestine and rectum: Secondary | ICD-10-CM | POA: Diagnosis not present

## 2019-06-12 DIAGNOSIS — C50911 Malignant neoplasm of unspecified site of right female breast: Secondary | ICD-10-CM

## 2019-06-12 DIAGNOSIS — R21 Rash and other nonspecific skin eruption: Secondary | ICD-10-CM | POA: Insufficient documentation

## 2019-06-12 DIAGNOSIS — C50312 Malignant neoplasm of lower-inner quadrant of left female breast: Secondary | ICD-10-CM | POA: Diagnosis not present

## 2019-06-12 DIAGNOSIS — Z9049 Acquired absence of other specified parts of digestive tract: Secondary | ICD-10-CM | POA: Diagnosis not present

## 2019-06-12 DIAGNOSIS — Z79811 Long term (current) use of aromatase inhibitors: Secondary | ICD-10-CM | POA: Diagnosis not present

## 2019-06-12 DIAGNOSIS — Z79899 Other long term (current) drug therapy: Secondary | ICD-10-CM | POA: Insufficient documentation

## 2019-06-12 DIAGNOSIS — Z170 Estrogen receptor positive status [ER+]: Secondary | ICD-10-CM | POA: Insufficient documentation

## 2019-06-12 DIAGNOSIS — R110 Nausea: Secondary | ICD-10-CM | POA: Insufficient documentation

## 2019-06-12 DIAGNOSIS — R109 Unspecified abdominal pain: Secondary | ICD-10-CM | POA: Diagnosis not present

## 2019-06-12 LAB — CBC WITH DIFFERENTIAL (CANCER CENTER ONLY)
Abs Immature Granulocytes: 0.02 10*3/uL (ref 0.00–0.07)
Basophils Absolute: 0.1 10*3/uL (ref 0.0–0.1)
Basophils Relative: 1 %
Eosinophils Absolute: 0.1 10*3/uL (ref 0.0–0.5)
Eosinophils Relative: 1 %
HCT: 37.9 % (ref 36.0–46.0)
Hemoglobin: 12.7 g/dL (ref 12.0–15.0)
Immature Granulocytes: 0 %
Lymphocytes Relative: 37 %
Lymphs Abs: 1.9 10*3/uL (ref 0.7–4.0)
MCH: 31.9 pg (ref 26.0–34.0)
MCHC: 33.5 g/dL (ref 30.0–36.0)
MCV: 95.2 fL (ref 80.0–100.0)
Monocytes Absolute: 0.3 10*3/uL (ref 0.1–1.0)
Monocytes Relative: 6 %
Neutro Abs: 2.8 10*3/uL (ref 1.7–7.7)
Neutrophils Relative %: 55 %
Platelet Count: 167 10*3/uL (ref 150–400)
RBC: 3.98 MIL/uL (ref 3.87–5.11)
RDW: 12.5 % (ref 11.5–15.5)
WBC Count: 5.2 10*3/uL (ref 4.0–10.5)
nRBC: 0 % (ref 0.0–0.2)

## 2019-06-12 LAB — CMP (CANCER CENTER ONLY)
ALT: 13 U/L (ref 0–44)
AST: 17 U/L (ref 15–41)
Albumin: 4.2 g/dL (ref 3.5–5.0)
Alkaline Phosphatase: 70 U/L (ref 38–126)
Anion gap: 7 (ref 5–15)
BUN: 14 mg/dL (ref 8–23)
CO2: 28 mmol/L (ref 22–32)
Calcium: 9.5 mg/dL (ref 8.9–10.3)
Chloride: 102 mmol/L (ref 98–111)
Creatinine: 1.04 mg/dL — ABNORMAL HIGH (ref 0.44–1.00)
GFR, Est AFR Am: >60 mL/min (ref >60)
GFR, Estimated: 55 mL/min — ABNORMAL LOW (ref >60)
Glucose, Bld: 118 mg/dL — ABNORMAL HIGH (ref 70–99)
Potassium: 4.1 mmol/L (ref 3.5–5.1)
Sodium: 137 mmol/L (ref 135–145)
Total Bilirubin: 0.3 mg/dL (ref 0.3–1.2)
Total Protein: 6.8 g/dL (ref 6.5–8.1)

## 2019-06-12 MED ORDER — ABEMACICLIB 150 MG PO TABS: 150.0000 mg | ORAL_TABLET | Freq: Every day | ORAL | 6 refills | Status: DC

## 2019-06-12 NOTE — Progress Notes (Signed)
Hematology and Oncology Follow Up Visit  TRUST CRAGO [id number] [birth year] 68 y.o. 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 daily  Ribociclib 600 mg p.o. daily (21/7) -- d/c due to skin rash  Verzenio 200 mg po q other day --  Changed on 010/15/2020     Interim History:  Martha Osborne is back for follow-up.  She did have surgery for the colonic metastasis.  She underwent a partial colectomy on 09/14/2018.  The pathology report ([id number]) showed 2 foci of metastatic lobular carcinoma.  She had 11 of 20+ lymph nodes.  All margins were negative.  Her appendix was negative for cancer.  We did  molecular studies.  Her tumor is ER positive and PR positive.  It is HER-2 negative.  We checked her for BRCA which was also negative.  She had low TMB.  The tumor was MSI stable.  She was negative for PD-L1.  Also she was negative for mutation in PIK3CA.  Overall, she is still doing quite well.  She is on the Verzenio at 200 mg every other day.  She seems to be tolerating this well.  She has little bit of nausea but she says that this gets better when she eats.  She has had no problems with abdominal pain.  There is no change in bowel or bladder habits.  She has had no cough.  Her last CA 27.29 was 34.7.  Unfortunately, her son and daughter-in-law could not come up for Thanksgiving because of exposure to the coronavirus down in [place].  It sounds like they will be coming up for Christmas which will certainly make Ms. Shall's holiday much better.  Overall, her performance status is still quite good at ECOG 0.    Medications:  Current Outpatient Medications:  .  abemaciclib (VERZENIO) 100 MG tablet, Take 1 tablet (100 mg total) by mouth 2 (two) times daily. Swallow tablets whole. Do not chew, crush, or split tablets before swallowing. (Patient  taking differently: Take 200 mg by mouth every other day. Swallow tablets whole. Do not chew, crush, or split tablets before swallowing.), Disp: 60 tablet, Rfl: 3 .  Cholecalciferol (VITAMIN D3 PO), Take 2,000 Units by mouth daily., Disp: , Rfl:  .  letrozole (FEMARA) 2.5 MG tablet, Take 1 tablet (2.5 mg total) by mouth daily., Disp: 30 tablet, Rfl: 12 .  ondansetron (ZOFRAN) 4 MG tablet, Take 1 tablet (4 mg total) by mouth every 8 (eight) hours as needed for nausea or vomiting. (Patient not taking: Reported on 05/16/2019), Disp: 20 tablet, Rfl: 2  Allergies: No Known Allergies  Past Medical History, Surgical history, Social history, and Family History were reviewed and updated.  Review of Systems: Review of Systems  Constitutional: Negative.   HENT:  Negative.   Eyes: Negative.   Respiratory: Negative.   Cardiovascular: Negative.   Gastrointestinal: Positive for abdominal pain.  Endocrine: Negative.   Genitourinary: Negative.    Musculoskeletal: Negative.   Skin: Negative.   Neurological: Negative.   Hematological: Negative.   Psychiatric/Behavioral: Negative.     Physical Exam:  weight is 137 lb 1.9 oz (62.2 kg). Her temporal temperature is 96 F (35.6 C) (abnormal). Her blood pressure is 123/48 (abnormal) and her pulse is 74. Her respiration is 20 and oxygen saturation is 99%.   Wt Readings from Last 3 Encounters:  06/12/19 137 lb 1.9 oz (62.2 kg)  05/16/19 139 lb 4 oz (63.2 kg)  04/05/19 135 lb (61.2 kg)    Physical Exam Vitals signs reviewed.  Constitutional:      Comments: Breast exam bilaterally shows no breast masses.  She has no nipple discharge bilaterally.  She has no breast swelling or erythema.  There is no bilateral axillary adenopathy.  HENT:     Head: Normocephalic and atraumatic.  Eyes:     Pupils: Pupils are equal, round, and reactive to light.  Neck:     Musculoskeletal: Normal range of motion.  Cardiovascular:     Rate and Rhythm: Normal rate and  regular rhythm.     Heart sounds: Normal heart sounds.  Pulmonary:     Effort: Pulmonary effort is normal.     Breath sounds: Normal breath sounds.  Abdominal:     General: Bowel sounds are normal.     Palpations: Abdomen is soft.     Comments: Abdominal exam shows the healing laparotomy scar.  She has no swelling.  There is no erythema about the surgical site.  She has been tenderness to palpation in the right lower quadrant.  No masses noted.  Bowel sounds are present.  There is no palpable liver or spleen tip.  Musculoskeletal: Normal range of motion.        General: No tenderness or deformity.  Lymphadenopathy:     Cervical: No cervical adenopathy.  Skin:    General: Skin is warm and dry.     Findings: No erythema or rash.  Neurological:     Mental Status: She is alert and oriented to person, place, and time.  Psychiatric:        Behavior: Behavior normal.        Thought Content: Thought content normal.        Judgment: Judgment normal.      Lab Results  Component Value Date   WBC 5.2 06/12/2019   HGB 12.7 06/12/2019   HCT 37.9 06/12/2019   MCV 95.2 06/12/2019   PLT 167 06/12/2019     Chemistry      Component Value Date/Time   NA 137 06/12/2019 1338   K 4.1 06/12/2019 1338   CL 102 06/12/2019 1338   CO2 28 06/12/2019 1338   BUN 14 06/12/2019 1338   CREATININE 1.04 (H) 06/12/2019 1338      Component Value Date/Time   CALCIUM 9.5 06/12/2019 1338   ALKPHOS 70 06/12/2019 1338   AST 17 06/12/2019 1338   ALT 13 06/12/2019 1338   BILITOT 0.3 06/12/2019 1338       Impression and Plan: [name] is a 68 year old postmenopausal female.  She has metastatic lobular carcinoma of the left breast.  We finally found their primary after doing a breast MRI.  She had resection of the colonic mass.  She had multiple positive lymph nodes.  Hopefully, she will continue to do well and respond to the [facility].  We will gradually try to increase the Verzenio dose up to  therapeutic.  Once she runs out of her samples, I sent in a prescription for Verzenio at 150 mg p.o. daily.  Ideally, we would like to go with twice a day dosing.  We will set her up with a PET scan at the end of the month.  She probably wants to actually have this in January 2021.  I am glad her quality of life is doing so well.  She is doing a  lot of yard work.  She and her husband are both very active and do walking.  I will plan to see her back in about 5 or 6 weeks.  [name], MD

## 2019-06-13 ENCOUNTER — Telehealth: Payer: Self-pay | Admitting: Hematology & Oncology

## 2019-06-13 ENCOUNTER — Encounter: Payer: Self-pay | Admitting: *Deleted

## 2019-06-13 ENCOUNTER — Ambulatory Visit
Admission: RE | Admit: 2019-06-13 | Discharge: 2019-06-13 | Disposition: A | Discharge status: Home / Self Care | Payer: Medicare Other | Source: Ambulatory Visit | Attending: Otolaryngology | Admitting: Otolaryngology

## 2019-06-13 DIAGNOSIS — H9312 Tinnitus, left ear: Secondary | ICD-10-CM | POA: Diagnosis not present

## 2019-06-13 DIAGNOSIS — H9190 Unspecified hearing loss, unspecified ear: Secondary | ICD-10-CM

## 2019-06-13 LAB — CANCER ANTIGEN 27.29: CA 27.29: 21.6 U/mL (ref 0.0–38.6)

## 2019-06-13 MED ORDER — GADOBENATE DIMEGLUMINE 529 MG/ML IV SOLN
12.0000 mL | Freq: Once | INTRAVENOUS | Status: AC | PRN
  Administered 2019-06-13: 12 mL via INTRAVENOUS

## 2019-06-13 NOTE — Telephone Encounter (Signed)
Appointments scheduled letter/calendar mailed per 12/9 los

## 2019-06-24 MED FILL — VERZENIO 150 MG TAB: 150 | 28 days supply | Qty: 28 | Fill #0

## 2019-06-25 MED FILL — LETROZOLE 2.5 MG TABLET: 2.5 | 30 days supply | Qty: 30 | Fill #8

## 2019-07-03 ENCOUNTER — Encounter (HOSPITAL_COMMUNITY)
Admission: RE | Admit: 2019-07-03 | Discharge: 2019-07-03 | Disposition: A | Discharge status: Home / Self Care | Payer: Medicare Other | Source: Ambulatory Visit | Attending: Hematology & Oncology | Admitting: Hematology & Oncology

## 2019-07-03 ENCOUNTER — Other Ambulatory Visit: Payer: Self-pay

## 2019-07-03 ENCOUNTER — Other Ambulatory Visit (HOSPITAL_COMMUNITY): Payer: Medicare Other

## 2019-07-03 ENCOUNTER — Encounter: Payer: Self-pay | Admitting: *Deleted

## 2019-07-03 DIAGNOSIS — Z79899 Other long term (current) drug therapy: Secondary | ICD-10-CM | POA: Insufficient documentation

## 2019-07-03 DIAGNOSIS — C785 Secondary malignant neoplasm of large intestine and rectum: Secondary | ICD-10-CM | POA: Insufficient documentation

## 2019-07-03 DIAGNOSIS — C50919 Malignant neoplasm of unspecified site of unspecified female breast: Secondary | ICD-10-CM | POA: Diagnosis not present

## 2019-07-03 DIAGNOSIS — C50911 Malignant neoplasm of unspecified site of right female breast: Secondary | ICD-10-CM | POA: Insufficient documentation

## 2019-07-03 LAB — GLUCOSE, CAPILLARY: Glucose-Capillary: 87 mg/dL (ref 70–99)

## 2019-07-03 MED ORDER — FLUDEOXYGLUCOSE F - 18 (FDG) INJECTION
6.8400 | Freq: Once | INTRAVENOUS | Status: AC
  Administered 2019-07-03: 10:00:00 6.84 via INTRAVENOUS

## 2019-07-22 DIAGNOSIS — Z6822 Body mass index (BMI) 22.0-22.9, adult: Secondary | ICD-10-CM | POA: Diagnosis not present

## 2019-07-22 DIAGNOSIS — Z01419 Encounter for gynecological examination (general) (routine) without abnormal findings: Secondary | ICD-10-CM | POA: Diagnosis not present

## 2019-07-23 ENCOUNTER — Ambulatory Visit: Payer: Medicare Other | Admitting: Hematology & Oncology

## 2019-07-23 ENCOUNTER — Other Ambulatory Visit: Payer: Medicare Other

## 2019-07-24 DIAGNOSIS — L821 Other seborrheic keratosis: Secondary | ICD-10-CM | POA: Diagnosis not present

## 2019-07-24 DIAGNOSIS — R238 Other skin changes: Secondary | ICD-10-CM | POA: Diagnosis not present

## 2019-07-24 DIAGNOSIS — Z85828 Personal history of other malignant neoplasm of skin: Secondary | ICD-10-CM | POA: Diagnosis not present

## 2019-07-24 DIAGNOSIS — L249 Irritant contact dermatitis, unspecified cause: Secondary | ICD-10-CM | POA: Diagnosis not present

## 2019-07-24 DIAGNOSIS — L570 Actinic keratosis: Secondary | ICD-10-CM | POA: Diagnosis not present

## 2019-07-24 DIAGNOSIS — L578 Other skin changes due to chronic exposure to nonionizing radiation: Secondary | ICD-10-CM | POA: Diagnosis not present

## 2019-07-24 DIAGNOSIS — D239 Other benign neoplasm of skin, unspecified: Secondary | ICD-10-CM | POA: Diagnosis not present

## 2019-07-24 DIAGNOSIS — D225 Melanocytic nevi of trunk: Secondary | ICD-10-CM | POA: Diagnosis not present

## 2019-07-29 DIAGNOSIS — H1045 Other chronic allergic conjunctivitis: Secondary | ICD-10-CM | POA: Diagnosis not present

## 2019-07-29 MED FILL — LETROZOLE 2.5 MG TABLET: 2.5 | 30 days supply | Qty: 30 | Fill #9

## 2019-07-29 MED FILL — VERZENIO 150 MG TAB: 150 | 28 days supply | Qty: 28 | Fill #1

## 2019-07-30 ENCOUNTER — Inpatient Hospital Stay: Payer: Medicare Other | Attending: Hematology & Oncology

## 2019-07-30 ENCOUNTER — Telehealth: Payer: Self-pay | Admitting: Hematology & Oncology

## 2019-07-30 ENCOUNTER — Inpatient Hospital Stay (HOSPITAL_BASED_OUTPATIENT_CLINIC_OR_DEPARTMENT_OTHER): Payer: Medicare Other | Admitting: Hematology & Oncology

## 2019-07-30 ENCOUNTER — Encounter: Payer: Self-pay | Admitting: Hematology & Oncology

## 2019-07-30 ENCOUNTER — Other Ambulatory Visit: Payer: Self-pay

## 2019-07-30 VITALS — BP 111/46 | HR 74 | Temp 97.3°F | Resp 17 | Ht 66.0 in | Wt 137.1 lb

## 2019-07-30 DIAGNOSIS — C785 Secondary malignant neoplasm of large intestine and rectum: Secondary | ICD-10-CM

## 2019-07-30 DIAGNOSIS — Z170 Estrogen receptor positive status [ER+]: Secondary | ICD-10-CM | POA: Insufficient documentation

## 2019-07-30 DIAGNOSIS — Z79899 Other long term (current) drug therapy: Secondary | ICD-10-CM | POA: Insufficient documentation

## 2019-07-30 DIAGNOSIS — C50312 Malignant neoplasm of lower-inner quadrant of left female breast: Secondary | ICD-10-CM | POA: Diagnosis not present

## 2019-07-30 DIAGNOSIS — C50911 Malignant neoplasm of unspecified site of right female breast: Secondary | ICD-10-CM

## 2019-07-30 DIAGNOSIS — Z79811 Long term (current) use of aromatase inhibitors: Secondary | ICD-10-CM | POA: Diagnosis not present

## 2019-07-30 DIAGNOSIS — Z9049 Acquired absence of other specified parts of digestive tract: Secondary | ICD-10-CM | POA: Diagnosis not present

## 2019-07-30 LAB — CBC WITH DIFFERENTIAL (CANCER CENTER ONLY)
Abs Immature Granulocytes: 0.01 10*3/uL (ref 0.00–0.07)
Basophils Absolute: 0 10*3/uL (ref 0.0–0.1)
Basophils Relative: 1 %
Eosinophils Absolute: 0.1 10*3/uL (ref 0.0–0.5)
Eosinophils Relative: 1 %
HCT: 40.9 % (ref 36.0–46.0)
Hemoglobin: 13.4 g/dL (ref 12.0–15.0)
Immature Granulocytes: 0 %
Lymphocytes Relative: 32 %
Lymphs Abs: 1.5 10*3/uL (ref 0.7–4.0)
MCH: 31.6 pg (ref 26.0–34.0)
MCHC: 32.8 g/dL (ref 30.0–36.0)
MCV: 96.5 fL (ref 80.0–100.0)
Monocytes Absolute: 0.3 10*3/uL (ref 0.1–1.0)
Monocytes Relative: 6 %
Neutro Abs: 2.8 10*3/uL (ref 1.7–7.7)
Neutrophils Relative %: 60 %
Platelet Count: 148 10*3/uL — ABNORMAL LOW (ref 150–400)
RBC: 4.24 MIL/uL (ref 3.87–5.11)
RDW: 13.1 % (ref 11.5–15.5)
WBC Count: 4.7 10*3/uL (ref 4.0–10.5)
nRBC: 0 % (ref 0.0–0.2)

## 2019-07-30 LAB — CMP (CANCER CENTER ONLY)
ALT: 13 U/L (ref 0–44)
AST: 19 U/L (ref 15–41)
Albumin: 4.3 g/dL (ref 3.5–5.0)
Alkaline Phosphatase: 77 U/L (ref 38–126)
Anion gap: 6 (ref 5–15)
BUN: 18 mg/dL (ref 8–23)
CO2: 30 mmol/L (ref 22–32)
Calcium: 9.7 mg/dL (ref 8.9–10.3)
Chloride: 102 mmol/L (ref 98–111)
Creatinine: 1.11 mg/dL — ABNORMAL HIGH (ref 0.44–1.00)
GFR, Est AFR Am: 59 mL/min — ABNORMAL LOW (ref >60)
GFR, Estimated: 51 mL/min — ABNORMAL LOW (ref >60)
Glucose, Bld: 91 mg/dL (ref 70–99)
Potassium: 4.3 mmol/L (ref 3.5–5.1)
Sodium: 138 mmol/L (ref 135–145)
Total Bilirubin: 0.4 mg/dL (ref 0.3–1.2)
Total Protein: 7.1 g/dL (ref 6.5–8.1)

## 2019-07-30 NOTE — Progress Notes (Signed)
Hematology and Oncology Follow Up Visit  Martha Osborne [id number] Sep 21, 1950 69 y.o. 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 daily  Ribociclib 600 mg p.o. daily (21/7) -- d/c due to skin rash       Verzenio 150 mg po day - started on 06/12/2019   Interim History:  Martha Osborne is back for follow-up.  She really looks good.  She feels good.  She is now on the Verzenio at 150 mg p.o. daily.  She is tolerating this quite well.  I really do not think that we have to increase the dose up to twice a day at this point.  She had a PET scan done on 07/03/2019.  PET scan did not show any evidence of metastatic breast cancer.  Everything looks fantastic.  Her last CA 27.29 was down to 21.    She had a very nice Christmas.  She had some of her family come up from [place].  She is seeing about playing golf again.  She and her husband are both trying to be active outside.  She has a good appetite.  There is no change in bowel or bladder habits.  She has had no cough.  There is been no fever.  She has had no rashes.  She changed her diet around.  She was having what she thought was morning sickness.  She no longer has this.  Overall, her performance status is ECOG 0.    Medications:  Current Outpatient Medications:    abemaciclib (VERZENIO) 150 MG tablet, Take 1 tablet (150 mg total) by mouth daily. Swallow tablets whole. Do not chew, crush, or split tablets before swallowing., Disp: 30 tablet, Rfl: 6   Cholecalciferol (VITAMIN D3 PO), Take 2,000 Units by mouth daily., Disp: , Rfl:    Cholecalciferol (VITAMIN D3) 100000 UNIT/GM POWD, Take by mouth., Disp: , Rfl:    hydrocortisone 2.5 % ointment, , Disp: , Rfl:    letrozole (FEMARA) 2.5 MG tablet, Take 1 tablet (2.5 mg total) by mouth daily., Disp: 30 tablet,  Rfl: 12   ondansetron (ZOFRAN) 4 MG tablet, Take 1 tablet (4 mg total) by mouth every 8 (eight) hours as needed for nausea or vomiting., Disp: 20 tablet, Rfl: 2  Allergies: No Known Allergies  Past Medical History, Surgical history, Social history, and Family History were reviewed and updated.  Review of Systems: Review of Systems  Constitutional: Negative.   HENT:  Negative.   Eyes: Negative.   Respiratory: Negative.   Cardiovascular: Negative.   Gastrointestinal: Positive for abdominal pain.  Endocrine: Negative.   Genitourinary: Negative.    Musculoskeletal: Negative.   Skin: Negative.   Neurological: Negative.   Hematological: Negative.   Psychiatric/Behavioral: Negative.     Physical Exam:  height is '5\' 6"'  (1.676 m) and weight is 137 lb 1.3 oz (62.2 kg). Her temperature is 97.3 F (36.3 C) (abnormal). Her blood pressure is 111/46 (abnormal) and her pulse is 74. Her respiration is 17 and oxygen saturation is 100%.   Wt Readings from Last 3 Encounters:  07/30/19 137 lb 1.3 oz (62.2 kg)  06/12/19 137 lb 1.9 oz (62.2 kg)  05/16/19 139 lb 4 oz (63.2 kg)    Physical Exam Vitals signs reviewed.  Constitutional:      Comments: Breast exam bilaterally shows no breast  masses.  She has no nipple discharge bilaterally.  She has no breast swelling or erythema.  There is no bilateral axillary adenopathy.  HENT:     Head: Normocephalic and atraumatic.  Eyes:     Pupils: Pupils are equal, round, and reactive to light.  Neck:     Musculoskeletal: Normal range of motion.  Cardiovascular:     Rate and Rhythm: Normal rate and regular rhythm.     Heart sounds: Normal heart sounds.  Pulmonary:     Effort: Pulmonary effort is normal.     Breath sounds: Normal breath sounds.  Abdominal:     General: Bowel sounds are normal.     Palpations: Abdomen is soft.     Comments: Abdominal exam shows the healing laparotomy scar.  She has no swelling.  There is no erythema about the surgical  site.  She has been tenderness to palpation in the right lower quadrant.  No masses noted.  Bowel sounds are present.  There is no palpable liver or spleen tip.  Musculoskeletal: Normal range of motion.        General: No tenderness or deformity.  Lymphadenopathy:     Cervical: No cervical adenopathy.  Skin:    General: Skin is warm and dry.     Findings: No erythema or rash.  Neurological:     Mental Status: She is alert and oriented to person, place, and time.  Psychiatric:        Behavior: Behavior normal.        Thought Content: Thought content normal.        Judgment: Judgment normal.      Lab Results  Component Value Date   WBC 4.7 07/30/2019   HGB 13.4 07/30/2019   HCT 40.9 07/30/2019   MCV 96.5 07/30/2019   PLT 148 (L) 07/30/2019     Chemistry      Component Value Date/Time   NA 138 07/30/2019 1135   K 4.3 07/30/2019 1135   CL 102 07/30/2019 1135   CO2 30 07/30/2019 1135   BUN 18 07/30/2019 1135   CREATININE 1.11 (H) 07/30/2019 1135      Component Value Date/Time   CALCIUM 9.7 07/30/2019 1135   ALKPHOS 77 07/30/2019 1135   AST 19 07/30/2019 1135   ALT 13 07/30/2019 1135   BILITOT 0.4 07/30/2019 1135       Impression and Plan: Martha Osborne is a 69 year old postmenopausal female.  She has metastatic lobular carcinoma of the left breast.  We finally found their primary after doing a breast MRI.  She had resection of the colonic mass.  She had multiple positive lymph nodes.   So far, everything looks fantastic.  I am just very excited about this.  Hopefully, she will stay in remission for quite a while.  This is a truly unique situation.  Hopefully, we will see a very good result just with antiestrogen therapy.  We will get her back in 8 weeks now.  Hopefully, if things continue to look good, we can gradually move her appointments out further and further.    [name], MD

## 2019-07-30 NOTE — Telephone Encounter (Signed)
Appointments scheduled patient declined calendar she has My Chart Access per 1/26 los

## 2019-07-31 LAB — CANCER ANTIGEN 27.29: CA 27.29: 26 U/mL (ref 0.0–38.6)

## 2019-08-15 DIAGNOSIS — Z23 Encounter for immunization: Secondary | ICD-10-CM | POA: Diagnosis not present

## 2019-08-27 MED FILL — LETROZOLE 2.5 MG TABLET: 2.5 | 30 days supply | Qty: 30 | Fill #10

## 2019-08-27 MED FILL — VERZENIO 150 MG TAB: 150 | 28 days supply | Qty: 28 | Fill #2

## 2019-09-13 DIAGNOSIS — Z23 Encounter for immunization: Secondary | ICD-10-CM | POA: Diagnosis not present

## 2019-09-26 MED FILL — VERZENIO 150 MG TAB: 150 | 28 days supply | Qty: 28 | Fill #3

## 2019-09-26 MED FILL — LETROZOLE 2.5 MG TABLET: 2.5 | 30 days supply | Qty: 30 | Fill #11

## 2019-10-01 ENCOUNTER — Other Ambulatory Visit: Payer: Self-pay

## 2019-10-01 ENCOUNTER — Encounter: Payer: Self-pay | Admitting: *Deleted

## 2019-10-01 ENCOUNTER — Inpatient Hospital Stay (HOSPITAL_BASED_OUTPATIENT_CLINIC_OR_DEPARTMENT_OTHER): Payer: Medicare Other | Admitting: Hematology & Oncology

## 2019-10-01 ENCOUNTER — Inpatient Hospital Stay: Payer: Medicare Other | Attending: Hematology & Oncology

## 2019-10-01 ENCOUNTER — Encounter: Payer: Self-pay | Admitting: Hematology & Oncology

## 2019-10-01 VITALS — BP 115/63 | HR 72 | Temp 97.1°F | Resp 20 | Wt 138.1 lb

## 2019-10-01 DIAGNOSIS — Z79811 Long term (current) use of aromatase inhibitors: Secondary | ICD-10-CM | POA: Diagnosis not present

## 2019-10-01 DIAGNOSIS — R109 Unspecified abdominal pain: Secondary | ICD-10-CM | POA: Diagnosis not present

## 2019-10-01 DIAGNOSIS — R110 Nausea: Secondary | ICD-10-CM | POA: Diagnosis not present

## 2019-10-01 DIAGNOSIS — Z79899 Other long term (current) drug therapy: Secondary | ICD-10-CM | POA: Diagnosis not present

## 2019-10-01 DIAGNOSIS — C50312 Malignant neoplasm of lower-inner quadrant of left female breast: Secondary | ICD-10-CM | POA: Diagnosis not present

## 2019-10-01 DIAGNOSIS — C50911 Malignant neoplasm of unspecified site of right female breast: Secondary | ICD-10-CM

## 2019-10-01 DIAGNOSIS — Z170 Estrogen receptor positive status [ER+]: Secondary | ICD-10-CM | POA: Insufficient documentation

## 2019-10-01 DIAGNOSIS — C785 Secondary malignant neoplasm of large intestine and rectum: Secondary | ICD-10-CM | POA: Insufficient documentation

## 2019-10-01 LAB — CBC WITH DIFFERENTIAL (CANCER CENTER ONLY)
Abs Immature Granulocytes: 0.02 10*3/uL (ref 0.00–0.07)
Basophils Absolute: 0.1 10*3/uL (ref 0.0–0.1)
Basophils Relative: 1 %
Eosinophils Absolute: 0.1 10*3/uL (ref 0.0–0.5)
Eosinophils Relative: 1 %
HCT: 39.1 % (ref 36.0–46.0)
Hemoglobin: 13 g/dL (ref 12.0–15.0)
Immature Granulocytes: 0 %
Lymphocytes Relative: 27 %
Lymphs Abs: 1.5 10*3/uL (ref 0.7–4.0)
MCH: 31.9 pg (ref 26.0–34.0)
MCHC: 33.2 g/dL (ref 30.0–36.0)
MCV: 95.8 fL (ref 80.0–100.0)
Monocytes Absolute: 0.3 10*3/uL (ref 0.1–1.0)
Monocytes Relative: 6 %
Neutro Abs: 3.5 10*3/uL (ref 1.7–7.7)
Neutrophils Relative %: 65 %
Platelet Count: 143 10*3/uL — ABNORMAL LOW (ref 150–400)
RBC: 4.08 MIL/uL (ref 3.87–5.11)
RDW: 12.9 % (ref 11.5–15.5)
WBC Count: 5.5 10*3/uL (ref 4.0–10.5)
nRBC: 0 % (ref 0.0–0.2)

## 2019-10-01 LAB — CMP (CANCER CENTER ONLY)
ALT: 12 U/L (ref 0–44)
AST: 18 U/L (ref 15–41)
Albumin: 4.1 g/dL (ref 3.5–5.0)
Alkaline Phosphatase: 71 U/L (ref 38–126)
Anion gap: 6 (ref 5–15)
BUN: 19 mg/dL (ref 8–23)
CO2: 30 mmol/L (ref 22–32)
Calcium: 9.8 mg/dL (ref 8.9–10.3)
Chloride: 102 mmol/L (ref 98–111)
Creatinine: 1.04 mg/dL — ABNORMAL HIGH (ref 0.44–1.00)
GFR, Est AFR Am: >60 mL/min (ref >60)
GFR, Estimated: 55 mL/min — ABNORMAL LOW (ref >60)
Glucose, Bld: 111 mg/dL — ABNORMAL HIGH (ref 70–99)
Potassium: 4.8 mmol/L (ref 3.5–5.1)
Sodium: 138 mmol/L (ref 135–145)
Total Bilirubin: 0.4 mg/dL (ref 0.3–1.2)
Total Protein: 6.8 g/dL (ref 6.5–8.1)

## 2019-10-01 NOTE — Progress Notes (Signed)
Hematology and Oncology Follow Up Visit  Martha Osborne [id number] Nov 22, 1950 69 y.o. 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 daily  Ribociclib 600 mg p.o. daily (21/7) -- d/c due to skin rash       Verzenio 150 mg po day - started on 06/12/2019     Interim History:  Martha Osborne is back for follow-up.  So far, she is doing quite well with the Verzenio at 150 mg a day.  She still has little bit of nausea in the morning which is troublesome for her.  I am just not sure exactly how we can try to help this.  She is on some Zofran.  She does have a normal CA 27-29.  We saw her back in January, the level was 26.  She is playing a little golf.  I am glad that she is able to get out side and do what she likes to do.  She has had no problems with arthralgias.  She is on letrozole for this.  She has had no fever.  She has had no cough.  She has had no change in bowel or bladder habits.  Her last PET scan was back in December 2020.  We do have to set up with another one.  Overall, her performance status is ECOG 0.    Medications:  Current Outpatient Medications:  .  abemaciclib (VERZENIO) 150 MG tablet, Take 1 tablet (150 mg total) by mouth daily. Swallow tablets whole. Do not chew, crush, or split tablets before swallowing., Disp: 30 tablet, Rfl: 6 .  Cholecalciferol (VITAMIN D3 PO), Take 2,000 Units by mouth daily., Disp: , Rfl:  .  letrozole (FEMARA) 2.5 MG tablet, Take 1 tablet (2.5 mg total) by mouth daily., Disp: 30 tablet, Rfl: 12 .  ondansetron (ZOFRAN) 4 MG tablet, Take 1 tablet (4 mg total) by mouth every 8 (eight) hours as needed for nausea or vomiting. (Patient not taking: Reported on 10/01/2019), Disp: 20 tablet, Rfl: 2  Allergies:  Allergies  Allergen Reactions  . Kisqali (200 Mg Dose) [Ribociclib  Succ (200 Mg Dose)] Rash and Other (See Comments)    Itchy, scratchy throat.    Past Medical History, Surgical history, Social history, and Family History were reviewed and updated.  Review of Systems: Review of Systems  Constitutional: Negative.   HENT:  Negative.   Eyes: Negative.   Respiratory: Negative.   Cardiovascular: Negative.   Gastrointestinal: Positive for abdominal pain.  Endocrine: Negative.   Genitourinary: Negative.    Musculoskeletal: Negative.   Skin: Negative.   Neurological: Negative.   Hematological: Negative.   Psychiatric/Behavioral: Negative.     Physical Exam:  weight is 138 lb 1.9 oz (62.7 kg). Her temporal temperature is 97.1 F (36.2 C) (abnormal). Her blood pressure is 115/63 and her pulse is 72. Her respiration is 20 and oxygen saturation is 100%.   Wt Readings from Last 3 Encounters:  10/01/19 138 lb 1.9 oz (62.7 kg)  07/30/19 137 lb 1.3 oz (62.2 kg)  06/12/19 137 lb 1.9 oz (62.2 kg)    Physical Exam Vitals signs reviewed.  Constitutional:      Comments: Breast exam bilaterally shows no breast masses.  She has no nipple discharge bilaterally.  She has no breast swelling or erythema.  There is no bilateral axillary adenopathy.  HENT:     Head: Normocephalic and atraumatic.  Eyes:     Pupils: Pupils are equal, round, and reactive to light.  Neck:     Musculoskeletal: Normal range of motion.  Cardiovascular:     Rate and Rhythm: Normal rate and regular rhythm.     Heart sounds: Normal heart sounds.  Pulmonary:     Effort: Pulmonary effort is normal.     Breath sounds: Normal breath sounds.  Abdominal:     General: Bowel sounds are normal.     Palpations: Abdomen is soft.     Comments: Abdominal exam shows the healing laparotomy scar.  She has no swelling.  There is no erythema about the surgical site.  She has been tenderness to palpation in the right lower quadrant.  No masses noted.  Bowel sounds are present.  There is no palpable liver  or spleen tip.  Musculoskeletal: Normal range of motion.        General: No tenderness or deformity.  Lymphadenopathy:     Cervical: No cervical adenopathy.  Skin:    General: Skin is warm and dry.     Findings: No erythema or rash.  Neurological:     Mental Status: She is alert and oriented to person, place, and time.  Psychiatric:        Behavior: Behavior normal.        Thought Content: Thought content normal.        Judgment: Judgment normal.      Lab Results  Component Value Date   WBC 5.5 10/01/2019   HGB 13.0 10/01/2019   HCT 39.1 10/01/2019   MCV 95.8 10/01/2019   PLT 143 (L) 10/01/2019     Chemistry      Component Value Date/Time   NA 138 10/01/2019 0959   K 4.8 10/01/2019 0959   CL 102 10/01/2019 0959   CO2 30 10/01/2019 0959   BUN 19 10/01/2019 0959   CREATININE 1.04 (H) 10/01/2019 0959      Component Value Date/Time   CALCIUM 9.8 10/01/2019 0959   ALKPHOS 71 10/01/2019 0959   AST 18 10/01/2019 0959   ALT 12 10/01/2019 0959   BILITOT 0.4 10/01/2019 0959       Impression and Plan: Martha Osborne is a 69 year old postmenopausal female.  She has metastatic lobular carcinoma of the left breast.  We finally found their primary after doing a breast MRI.  She had resection of the colonic mass.  She had multiple positive lymph nodes.   So far, everything looks fantastic.  I am just very excited about this.  Hopefully, she will stay in remission for quite a while.  This is a truly unique situation.  Hopefully, we will see a very good result just with antiestrogen therapy.  We will get her back in 8 weeks now.  Hopefully, if things continue to look good, we can gradually move her appointments out further and further.    [name], MD

## 2019-10-02 LAB — CANCER ANTIGEN 27.29: CA 27.29: 26.8 U/mL (ref 0.0–38.6)

## 2019-10-17 ENCOUNTER — Other Ambulatory Visit: Payer: Self-pay | Admitting: *Deleted

## 2019-10-17 DIAGNOSIS — C50911 Malignant neoplasm of unspecified site of right female breast: Secondary | ICD-10-CM

## 2019-10-17 MED ORDER — LETROZOLE 2.5 MG PO TABS: 2.5000 mg | ORAL_TABLET | Freq: Every day | ORAL | 12 refills | Status: DC

## 2019-10-24 ENCOUNTER — Telehealth: Payer: Self-pay | Admitting: Hematology & Oncology

## 2019-10-24 NOTE — Telephone Encounter (Signed)
Appointments r/s from 5/28 [facility] letter/calendar mailed

## 2019-10-28 DIAGNOSIS — C785 Secondary malignant neoplasm of large intestine and rectum: Secondary | ICD-10-CM | POA: Diagnosis not present

## 2019-10-28 DIAGNOSIS — C50312 Malignant neoplasm of lower-inner quadrant of left female breast: Secondary | ICD-10-CM | POA: Diagnosis not present

## 2019-10-28 MED FILL — VERZENIO 150 MG TAB: 150 | 28 days supply | Qty: 28 | Fill #4

## 2019-10-29 DIAGNOSIS — R6889 Other general symptoms and signs: Secondary | ICD-10-CM | POA: Diagnosis not present

## 2019-11-11 ENCOUNTER — Other Ambulatory Visit: Payer: Self-pay | Admitting: Hematology & Oncology

## 2019-11-11 ENCOUNTER — Other Ambulatory Visit: Payer: Self-pay

## 2019-11-11 ENCOUNTER — Ambulatory Visit (HOSPITAL_COMMUNITY)
Admission: RE | Admit: 2019-11-11 | Discharge: 2019-11-11 | Disposition: A | Discharge status: Home / Self Care | Payer: Medicare Other | Source: Ambulatory Visit | Attending: Hematology & Oncology | Admitting: Hematology & Oncology

## 2019-11-11 DIAGNOSIS — C785 Secondary malignant neoplasm of large intestine and rectum: Secondary | ICD-10-CM | POA: Diagnosis not present

## 2019-11-11 DIAGNOSIS — Z79899 Other long term (current) drug therapy: Secondary | ICD-10-CM | POA: Diagnosis not present

## 2019-11-11 DIAGNOSIS — C50919 Malignant neoplasm of unspecified site of unspecified female breast: Secondary | ICD-10-CM | POA: Diagnosis not present

## 2019-11-11 DIAGNOSIS — C50912 Malignant neoplasm of unspecified site of left female breast: Secondary | ICD-10-CM

## 2019-11-11 DIAGNOSIS — C50911 Malignant neoplasm of unspecified site of right female breast: Secondary | ICD-10-CM | POA: Diagnosis not present

## 2019-11-11 DIAGNOSIS — I700 Atherosclerosis of aorta: Secondary | ICD-10-CM | POA: Diagnosis not present

## 2019-11-11 LAB — GLUCOSE, CAPILLARY: Glucose-Capillary: 92 mg/dL (ref 70–99)

## 2019-11-11 MED ORDER — FLUDEOXYGLUCOSE F - 18 (FDG) INJECTION
6.8900 | Freq: Once | INTRAVENOUS | Status: AC | PRN
  Administered 2019-11-11: 10:00:00 6.89 via INTRAVENOUS

## 2019-11-25 MED FILL — VERZENIO 150 MG TAB: 150 | 28 days supply | Qty: 28 | Fill #5

## 2019-11-29 ENCOUNTER — Ambulatory Visit: Payer: Medicare Other | Admitting: Hematology & Oncology

## 2019-11-29 ENCOUNTER — Other Ambulatory Visit: Payer: Medicare Other

## 2019-12-03 DIAGNOSIS — M549 Dorsalgia, unspecified: Secondary | ICD-10-CM | POA: Diagnosis not present

## 2019-12-16 ENCOUNTER — Inpatient Hospital Stay: Payer: Medicare Other | Attending: Hematology & Oncology

## 2019-12-16 ENCOUNTER — Inpatient Hospital Stay (HOSPITAL_BASED_OUTPATIENT_CLINIC_OR_DEPARTMENT_OTHER): Payer: Medicare Other | Admitting: Hematology & Oncology

## 2019-12-16 ENCOUNTER — Other Ambulatory Visit: Payer: Self-pay

## 2019-12-16 ENCOUNTER — Other Ambulatory Visit: Payer: Self-pay | Admitting: *Deleted

## 2019-12-16 VITALS — BP 127/57 | HR 78 | Temp 97.1°F | Resp 16 | Wt 137.0 lb

## 2019-12-16 DIAGNOSIS — C50312 Malignant neoplasm of lower-inner quadrant of left female breast: Secondary | ICD-10-CM | POA: Insufficient documentation

## 2019-12-16 DIAGNOSIS — Z170 Estrogen receptor positive status [ER+]: Secondary | ICD-10-CM | POA: Insufficient documentation

## 2019-12-16 DIAGNOSIS — R109 Unspecified abdominal pain: Secondary | ICD-10-CM | POA: Diagnosis not present

## 2019-12-16 DIAGNOSIS — C50911 Malignant neoplasm of unspecified site of right female breast: Secondary | ICD-10-CM

## 2019-12-16 DIAGNOSIS — C785 Secondary malignant neoplasm of large intestine and rectum: Secondary | ICD-10-CM | POA: Diagnosis not present

## 2019-12-16 DIAGNOSIS — Z9049 Acquired absence of other specified parts of digestive tract: Secondary | ICD-10-CM | POA: Diagnosis not present

## 2019-12-16 DIAGNOSIS — Z79811 Long term (current) use of aromatase inhibitors: Secondary | ICD-10-CM | POA: Insufficient documentation

## 2019-12-16 LAB — COMPREHENSIVE METABOLIC PANEL
ALT: 11 U/L (ref 0–44)
AST: 17 U/L (ref 15–41)
Albumin: 4.1 g/dL (ref 3.5–5.0)
Alkaline Phosphatase: 59 U/L (ref 38–126)
Anion gap: 5 (ref 5–15)
BUN: 15 mg/dL (ref 8–23)
CO2: 30 mmol/L (ref 22–32)
Calcium: 9.6 mg/dL (ref 8.9–10.3)
Chloride: 101 mmol/L (ref 98–111)
Creatinine, Ser: 1 mg/dL (ref 0.44–1.00)
GFR calc Af Amer: >60 mL/min (ref >60)
GFR calc non Af Amer: 57 mL/min — ABNORMAL LOW (ref >60)
Glucose, Bld: 103 mg/dL — ABNORMAL HIGH (ref 70–99)
Potassium: 4.3 mmol/L (ref 3.5–5.1)
Sodium: 136 mmol/L (ref 135–145)
Total Bilirubin: 0.4 mg/dL (ref 0.3–1.2)
Total Protein: 6.6 g/dL (ref 6.5–8.1)

## 2019-12-16 LAB — CBC WITH DIFFERENTIAL (CANCER CENTER ONLY)
Abs Immature Granulocytes: 0.01 10*3/uL (ref 0.00–0.07)
Basophils Absolute: 0.1 10*3/uL (ref 0.0–0.1)
Basophils Relative: 1 %
Eosinophils Absolute: 0 10*3/uL (ref 0.0–0.5)
Eosinophils Relative: 1 %
HCT: 38.2 % (ref 36.0–46.0)
Hemoglobin: 12.9 g/dL (ref 12.0–15.0)
Immature Granulocytes: 0 %
Lymphocytes Relative: 30 %
Lymphs Abs: 1.4 10*3/uL (ref 0.7–4.0)
MCH: 32.8 pg (ref 26.0–34.0)
MCHC: 33.8 g/dL (ref 30.0–36.0)
MCV: 97.2 fL (ref 80.0–100.0)
Monocytes Absolute: 0.3 10*3/uL (ref 0.1–1.0)
Monocytes Relative: 5 %
Neutro Abs: 2.9 10*3/uL (ref 1.7–7.7)
Neutrophils Relative %: 63 %
Platelet Count: 120 10*3/uL — ABNORMAL LOW (ref 150–400)
RBC: 3.93 MIL/uL (ref 3.87–5.11)
RDW: 12.7 % (ref 11.5–15.5)
WBC Count: 4.7 10*3/uL (ref 4.0–10.5)
nRBC: 0 % (ref 0.0–0.2)

## 2019-12-16 NOTE — Progress Notes (Signed)
Hematology and Oncology Follow Up Visit  Martha Osborne [id number] [birth year] 69 y.o. 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 daily  Ribociclib 600 mg p.o. daily (21/7) -- d/c due to skin rash       Verzenio 150 mg po day - started on 06/12/2019     Interim History:  Martha Osborne is back for follow-up.  She really looks great.  She is now playing some golf.  She and her husband actually went down to [place] to be with their son.  They had a wonderful time down there.  Her last CA 27.29 was holding steady at 26.8.  She saw Dr. Barry Dienes.  It is possible that Dr. Barry Dienes may consider resecting out the primary in the left breast.  Everything has never shown up metastatic for Martha Osborne says she had the colonic tumor resected out.  She had a PET scan done on May 10.  The PET scan did not show any evidence of metastatic disease.  She is doing well on the Verzenio with 150 mg a day.  She is having no problems with the letrozole.  There is been no problems with nausea or vomiting.  She has had no issues with fever.  She has had no change in bowel or bladder habits.  She has had no cough or shortness of breath.  There is been no leg swelling.  Overall, her performance status is ECOG 0.   Medications:  Current Outpatient Medications:  .  psyllium (METAMUCIL) 58.6 % packet, Take 1 packet by mouth daily., Disp: , Rfl:  .  abemaciclib (VERZENIO) 150 MG tablet, Take 1 tablet (150 mg total) by mouth daily. Swallow tablets whole. Do not chew, crush, or split tablets before swallowing., Disp: 30 tablet, Rfl: 6 .  Cholecalciferol (VITAMIN D3 PO), Take 2,000 Units by mouth daily., Disp: , Rfl:  .  letrozole (FEMARA) 2.5 MG tablet, Take 1 tablet (2.5 mg total) by mouth daily., Disp: 30 tablet, Rfl: 12 .  ondansetron (ZOFRAN) 4  MG tablet, Take 1 tablet (4 mg total) by mouth every 8 (eight) hours as needed for nausea or vomiting. (Patient not taking: Reported on 10/01/2019), Disp: 20 tablet, Rfl: 2  Allergies:  Allergies  Allergen Reactions  . Kisqali (200 Mg Dose) [Ribociclib Succ (200 Mg Dose)] Rash and Other (See Comments)    Itchy, scratchy throat.    Past Medical History, Surgical history, Social history, and Family History were reviewed and updated.  Review of Systems: Review of Systems  Constitutional: Negative.   HENT:  Negative.   Eyes: Negative.   Respiratory: Negative.   Cardiovascular: Negative.   Gastrointestinal: Positive for abdominal pain.  Endocrine: Negative.   Genitourinary: Negative.    Musculoskeletal: Negative.   Skin: Negative.   Neurological: Negative.   Hematological: Negative.   Psychiatric/Behavioral: Negative.     Physical Exam:  weight is 137 lb (62.1 kg). Her temporal temperature is 97.1 F (36.2 C) (abnormal). Her blood pressure is 127/57 (abnormal) and her pulse is 78. Her respiration is 16 and oxygen saturation is 100%.   Wt Readings from Last 3 Encounters:  12/16/19 137 lb (62.1 kg)  10/01/19 138 lb 1.9 oz (62.7 kg)  07/30/19 137 lb 1.3 oz (62.2 kg)    Physical Exam Vitals signs reviewed.  Constitutional:  Comments: Breast exam bilaterally shows no breast masses.  She has no nipple discharge bilaterally.  She has no breast swelling or erythema.  There is no bilateral axillary adenopathy.  HENT:     Head: Normocephalic and atraumatic.  Eyes:     Pupils: Pupils are equal, round, and reactive to light.  Neck:     Musculoskeletal: Normal range of motion.  Cardiovascular:     Rate and Rhythm: Normal rate and regular rhythm.     Heart sounds: Normal heart sounds.  Pulmonary:     Effort: Pulmonary effort is normal.     Breath sounds: Normal breath sounds.  Abdominal:     General: Bowel sounds are normal.     Palpations: Abdomen is soft.     Comments:  Abdominal exam shows the healing laparotomy scar.  She has no swelling.  There is no erythema about the surgical site.  She has been tenderness to palpation in the right lower quadrant.  No masses noted.  Bowel sounds are present.  There is no palpable liver or spleen tip.  Musculoskeletal: Normal range of motion.        General: No tenderness or deformity.  Lymphadenopathy:     Cervical: No cervical adenopathy.  Skin:    General: Skin is warm and dry.     Findings: No erythema or rash.  Neurological:     Mental Status: She is alert and oriented to person, place, and time.  Psychiatric:        Behavior: Behavior normal.        Thought Content: Thought content normal.        Judgment: Judgment normal.      Lab Results  Component Value Date   WBC 4.7 12/16/2019   HGB 12.9 12/16/2019   HCT 38.2 12/16/2019   MCV 97.2 12/16/2019   PLT 120 (L) 12/16/2019     Chemistry      Component Value Date/Time   NA 136 12/16/2019 0954   K 4.3 12/16/2019 0954   CL 101 12/16/2019 0954   CO2 30 12/16/2019 0954   BUN 15 12/16/2019 0954   CREATININE 1.00 12/16/2019 0954   CREATININE 1.04 (H) 10/01/2019 0959      Component Value Date/Time   CALCIUM 9.6 12/16/2019 0954   ALKPHOS 59 12/16/2019 0954   AST 17 12/16/2019 0954   AST 18 10/01/2019 0959   ALT 11 12/16/2019 0954   ALT 12 10/01/2019 0959   BILITOT 0.4 12/16/2019 0954   BILITOT 0.4 10/01/2019 0959       Impression and Plan: Martha Osborne is a 69 year old postmenopausal female.  She has metastatic lobular carcinoma of the left breast.  We finally found their primary after doing a breast MRI.  She had resection of the colonic mass.  She had multiple positive lymph nodes.   So far, everything looks fantastic.  I am just very excited about this.  Hopefully, she will stay in remission for quite a while.  This is a truly unique situation.  I certainly understand the possibility of doing a resection of the left breast mass.  We will have  to get a breast MRI set up and another PET scan set up in September.  I will plan to see [name] back a week after these x-rays were done.  I know she will have a nice summer.     [name], MD

## 2019-12-17 ENCOUNTER — Encounter: Payer: Self-pay | Admitting: *Deleted

## 2019-12-17 ENCOUNTER — Other Ambulatory Visit: Payer: Self-pay | Admitting: Family

## 2019-12-17 DIAGNOSIS — C50911 Malignant neoplasm of unspecified site of right female breast: Secondary | ICD-10-CM

## 2019-12-17 LAB — CANCER ANTIGEN 27.29: CA 27.29: 24.6 U/mL (ref 0.0–38.6)

## 2019-12-19 MED FILL — VERZENIO 150 MG TAB: 150 | 28 days supply | Qty: 28 | Fill #6

## 2019-12-26 ENCOUNTER — Other Ambulatory Visit: Payer: Self-pay | Admitting: Family

## 2019-12-26 ENCOUNTER — Telehealth: Payer: Self-pay | Admitting: Hematology & Oncology

## 2019-12-26 DIAGNOSIS — C50911 Malignant neoplasm of unspecified site of right female breast: Secondary | ICD-10-CM

## 2019-12-26 NOTE — Telephone Encounter (Signed)
I called patient to advise her on the MR.  Per phone call to [place] she will need to have Mammogram prior since it's been over one year.  They will then schedule her for MR Breast after she has Mammogram.  She understood this information

## 2019-12-31 ENCOUNTER — Other Ambulatory Visit: Payer: Self-pay | Admitting: Family

## 2019-12-31 DIAGNOSIS — C785 Secondary malignant neoplasm of large intestine and rectum: Secondary | ICD-10-CM

## 2020-01-16 ENCOUNTER — Other Ambulatory Visit: Payer: Self-pay

## 2020-01-17 ENCOUNTER — Ambulatory Visit
Admission: RE | Admit: 2020-01-17 | Discharge: 2020-01-17 | Disposition: A | Discharge status: Home / Self Care | Payer: Medicare Other | Source: Ambulatory Visit | Attending: Family | Admitting: Family

## 2020-01-17 ENCOUNTER — Other Ambulatory Visit: Payer: Self-pay | Admitting: Family

## 2020-01-17 ENCOUNTER — Other Ambulatory Visit: Payer: Self-pay

## 2020-01-17 DIAGNOSIS — R922 Inconclusive mammogram: Secondary | ICD-10-CM | POA: Diagnosis not present

## 2020-01-17 DIAGNOSIS — C50911 Malignant neoplasm of unspecified site of right female breast: Secondary | ICD-10-CM

## 2020-01-17 DIAGNOSIS — Z9289 Personal history of other medical treatment: Secondary | ICD-10-CM

## 2020-01-17 DIAGNOSIS — N6324 Unspecified lump in the left breast, lower inner quadrant: Secondary | ICD-10-CM | POA: Diagnosis not present

## 2020-01-17 HISTORY — DX: Personal history of antineoplastic chemotherapy: Z92.21

## 2020-01-17 HISTORY — DX: Personal history of other medical treatment: Z92.89

## 2020-01-20 MED FILL — VERZENIO 150 MG TAB: 150 | 14 days supply | Qty: 14 | Fill #7

## 2020-01-21 ENCOUNTER — Other Ambulatory Visit: Payer: Self-pay | Admitting: Hematology & Oncology

## 2020-01-21 ENCOUNTER — Encounter: Payer: Self-pay | Admitting: Hematology & Oncology

## 2020-01-22 ENCOUNTER — Other Ambulatory Visit: Payer: Self-pay

## 2020-01-22 DIAGNOSIS — C50911 Malignant neoplasm of unspecified site of right female breast: Secondary | ICD-10-CM

## 2020-01-22 MED ORDER — ABEMACICLIB 150 MG PO TABS: 150.0000 mg | ORAL_TABLET | Freq: Every day | ORAL | 3 refills | Status: DC

## 2020-02-03 MED FILL — VERZENIO 150 MG TAB: 150 | 28 days supply | Qty: 28 | Fill #0

## 2020-02-11 IMAGING — MG DIGITAL DIAGNOSTIC BILATERAL MAMMOGRAM WITH TOMO AND CAD
8 series · 8 of 24 positions shown · non-contrast
Comparison: Previous exam(s).

CLINICAL DATA: This patient has recently diagnosed colon carcinoma,
with presented as right lower quadrant pain. CT showed wall
thickening of the ascending colon with subsequent colonoscopy and
biopsy showing tissue consistent with metastatic lobular carcinoma,
estrogen receptor positive. She underwent breast MRI where a 2.3 x
1.1 x 1.4 cm area of non mass enhancement was detected in the lower
inner quadrant of the left breast. She presents today for diagnostic
mammographic and ultrasound assessment, and ultrasound-guided core
needle biopsy if an ultrasound correlate for the left breast MRI
abnormality can be detected. Of note, this patient had a lesion in
the left breast initially detected in 7941, assessed with short-term
followup for 2 years, with no change, diagnosed as benign. This is
in a different location than the mammographic finding, and showed no
abnormal MRI contrast uptake.

EXAM:
DIGITAL DIAGNOSTIC BILATERAL MAMMOGRAM WITH CAD AND TOMO
ULTRASOUND RIGHT BREAST

[R CC synth-2D]
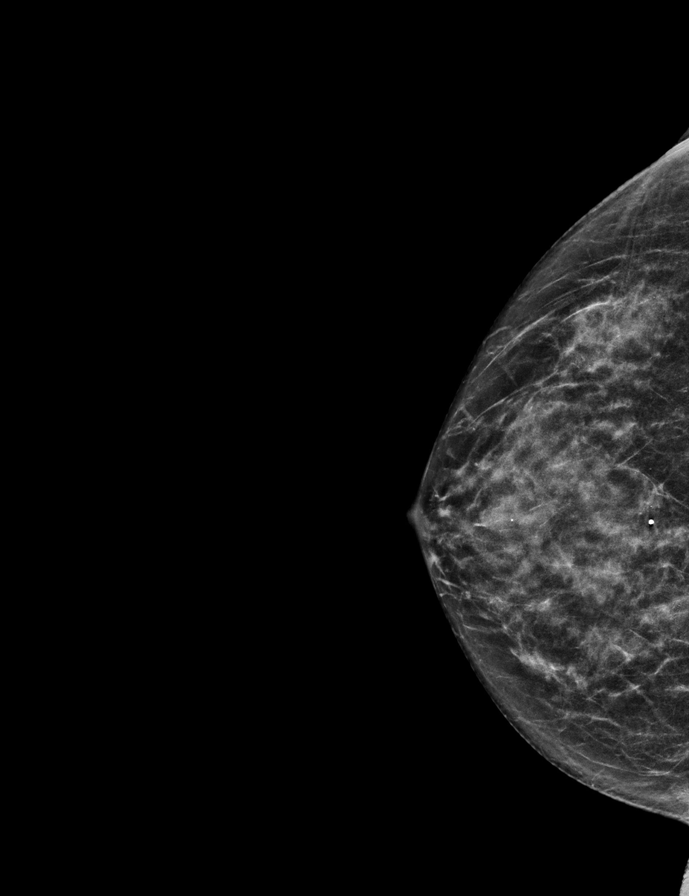

[L MLO synth-2D]
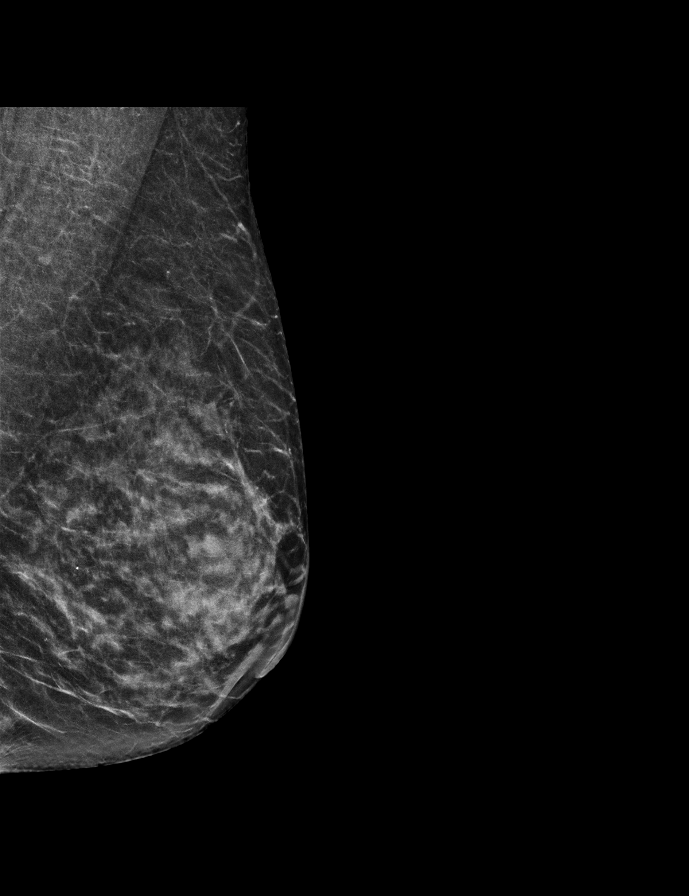

[L CC synth-2D]
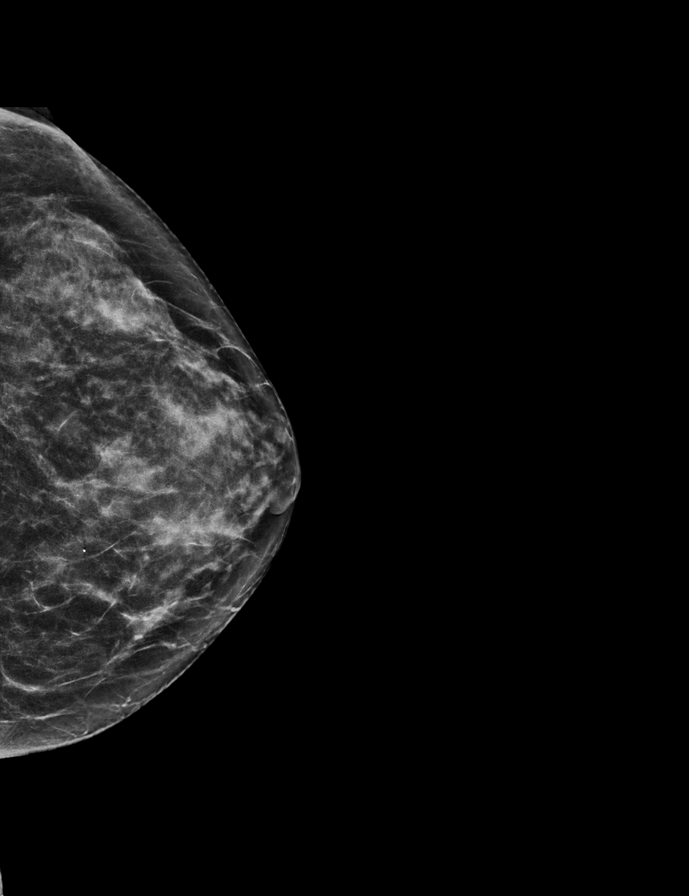

[R MLO synth-2D]
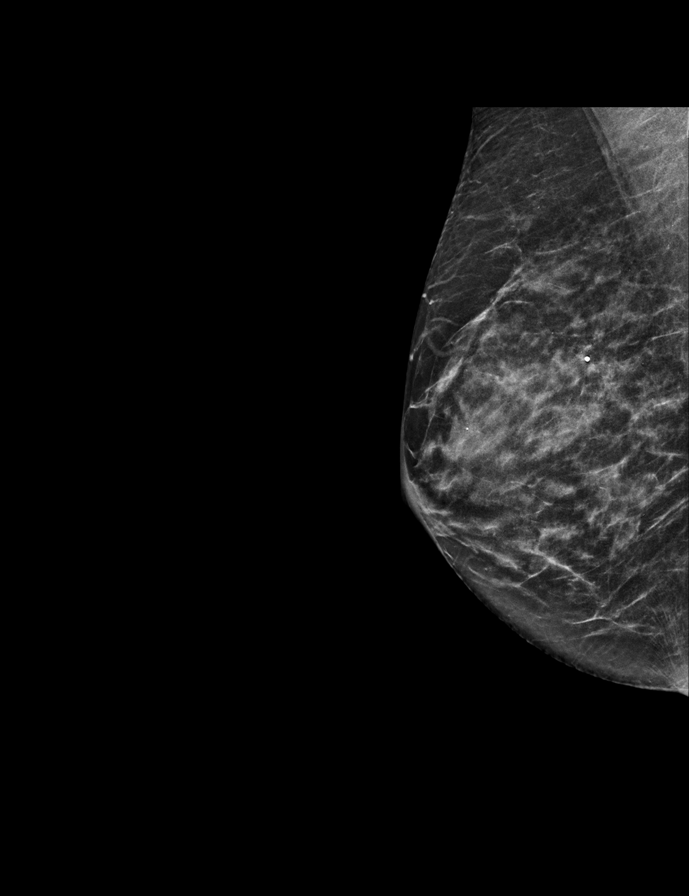

[R MLO tomo · tomo slice 27/52.0]
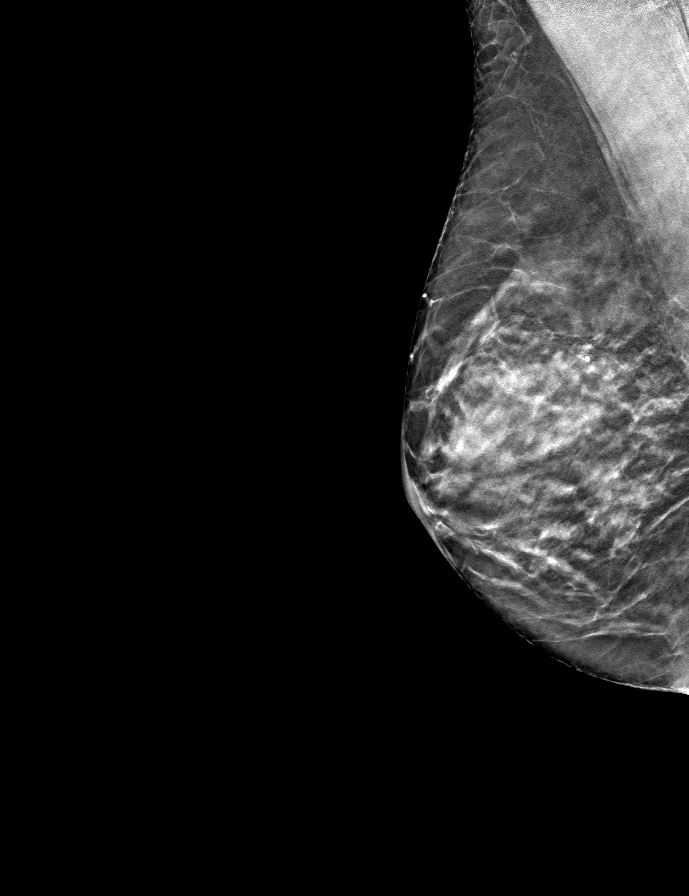

[R CC tomo · tomo slice 26/51.0]
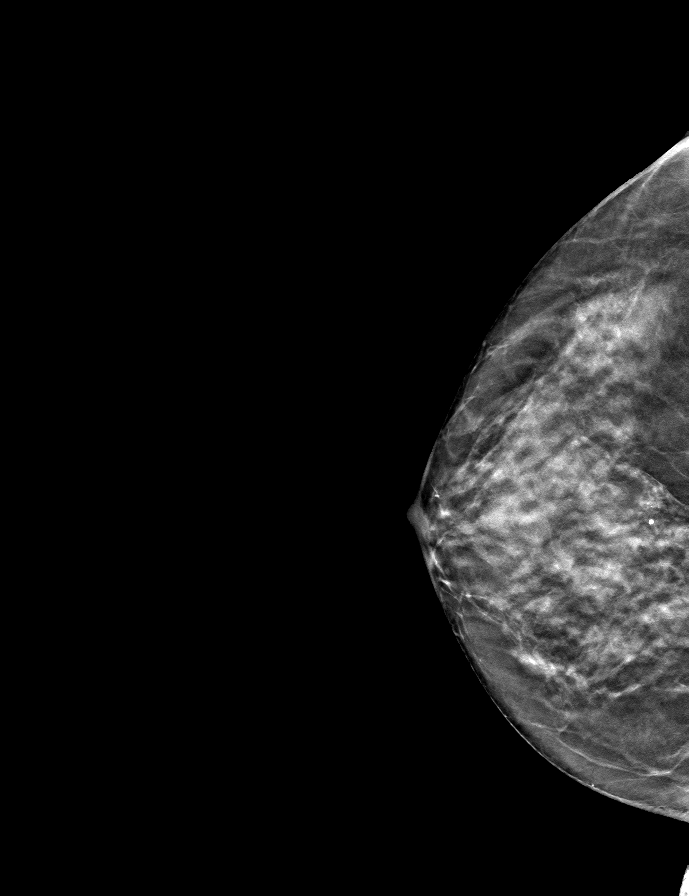

[L MLO tomo · tomo slice 26/51.0]
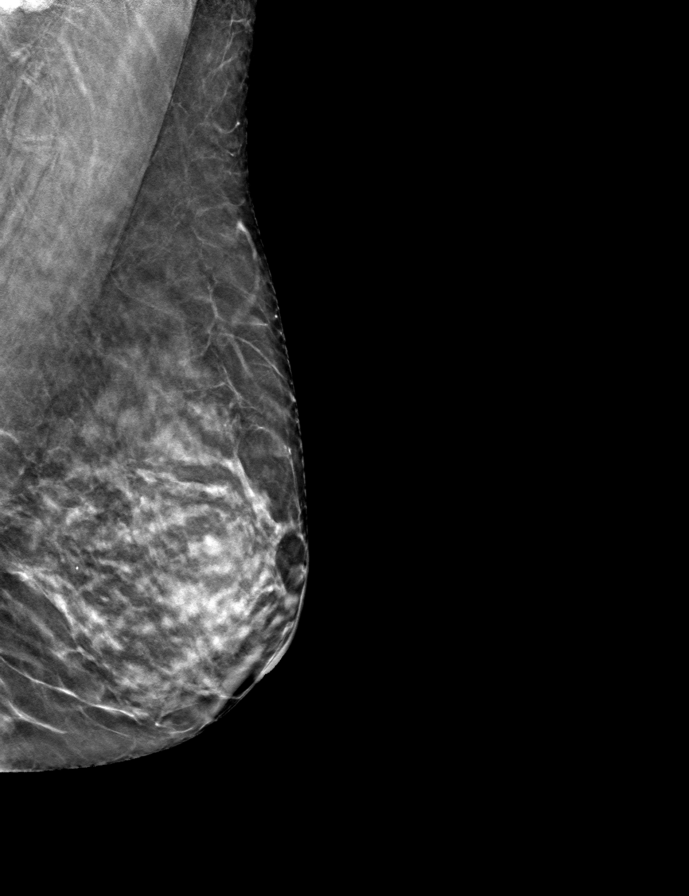

[L CC tomo · tomo slice 29/56.0]
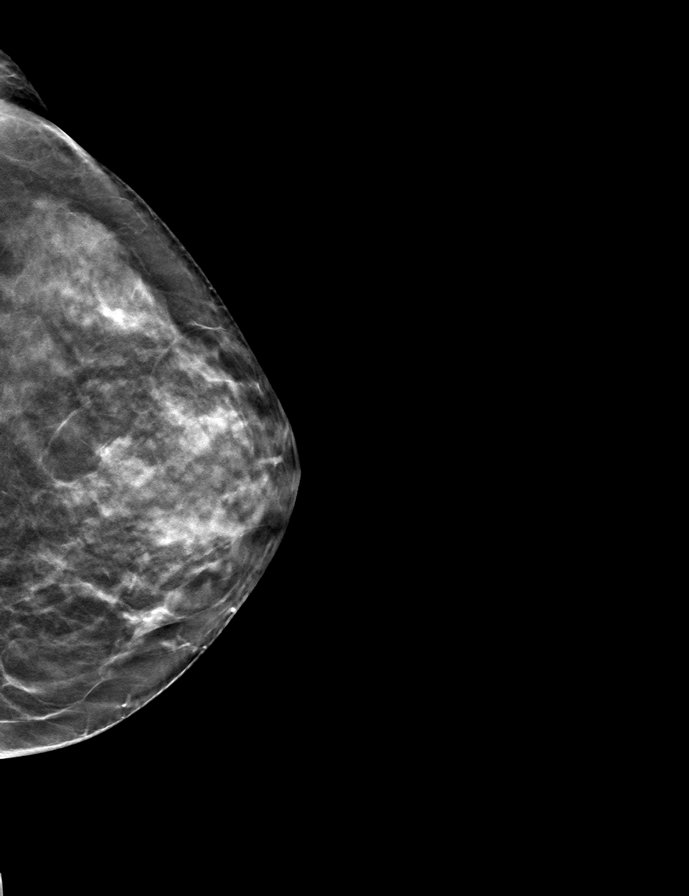

[8 of 24 positions shown; findings below may reference images not displayed]

ACR Breast Density Category c: The breast tissue is heterogeneously
dense, which may obscure small masses.
FINDINGS: There are no discrete masses, areas of architectural distortion,
areas of significant asymmetry or suspicious calcifications. No
mammographic change.

Mammographic images were processed with CAD.

On physical exam, there is a small firm mass in the left breast,
lower inner quadrant adjacent to the nipple.

Targeted ultrasound is performed, showing a hypoechoic irregular
mass with ill-defined margins in the 7 o'clock position of the left
breast, 1 cm from the nipple, anterior/superficial depth, measuring
16 x 10 x 11 mm, consistent in location with the abnormal
enhancement seen on the recent breast MRI. This also corresponds to
the palpable abnormality.

Sonographic evaluation of the left axilla shows no enlarged or
abnormal lymph nodes.
IMPRESSION: 1. 1.6 cm irregular hypoechoic shadowing mass in the left breast at
7 o'clock, adjacent to the nipple, corresponding in location to the
abnormal enhancement seen on MRI. This is suspicious for the
patient's primary breast malignancy. Ultrasound-guided core needle
biopsy will be performed today.
2. No other abnormalities. No enlarged or abnormal left axillary
lymph nodes.

RECOMMENDATION:
1. Ultrasound-guided core needle biopsy of the left breast mass at 7
o'clock, 1 cm from the nipple. This will be performed today.

I have discussed the findings and recommendations with the patient.
Results were also provided in writing at the conclusion of the
visit. If applicable, a reminder letter will be sent to the patient
regarding the next appointment.

BI-RADS CATEGORY  4: Suspicious.

## 2020-03-02 MED FILL — VERZENIO 150 MG TAB: 150 | 28 days supply | Qty: 28 | Fill #1

## 2020-03-12 ENCOUNTER — Other Ambulatory Visit: Payer: Self-pay

## 2020-03-12 ENCOUNTER — Ambulatory Visit
Admission: RE | Admit: 2020-03-12 | Discharge: 2020-03-12 | Disposition: A | Discharge status: Home / Self Care | Payer: Medicare Other | Source: Ambulatory Visit | Attending: Family | Admitting: Family

## 2020-03-12 DIAGNOSIS — D0512 Intraductal carcinoma in situ of left breast: Secondary | ICD-10-CM | POA: Diagnosis not present

## 2020-03-12 DIAGNOSIS — C50911 Malignant neoplasm of unspecified site of right female breast: Secondary | ICD-10-CM

## 2020-03-12 MED ORDER — GADOBUTROL 1 MMOL/ML IV SOLN
6.0000 mL | Freq: Once | INTRAVENOUS | Status: AC | PRN
  Administered 2020-03-12: 6 mL via INTRAVENOUS

## 2020-03-14 ENCOUNTER — Encounter: Payer: Self-pay | Admitting: Hematology & Oncology

## 2020-03-16 DIAGNOSIS — H43812 Vitreous degeneration, left eye: Secondary | ICD-10-CM | POA: Diagnosis not present

## 2020-03-16 DIAGNOSIS — H2513 Age-related nuclear cataract, bilateral: Secondary | ICD-10-CM | POA: Diagnosis not present

## 2020-03-16 DIAGNOSIS — H43391 Other vitreous opacities, right eye: Secondary | ICD-10-CM | POA: Diagnosis not present

## 2020-03-17 ENCOUNTER — Ambulatory Visit (HOSPITAL_COMMUNITY)
Admission: RE | Admit: 2020-03-17 | Discharge: 2020-03-17 | Disposition: A | Discharge status: Home / Self Care | Payer: Medicare Other | Source: Ambulatory Visit | Attending: Hematology & Oncology | Admitting: Hematology & Oncology

## 2020-03-17 ENCOUNTER — Other Ambulatory Visit: Payer: Self-pay

## 2020-03-17 DIAGNOSIS — C50911 Malignant neoplasm of unspecified site of right female breast: Secondary | ICD-10-CM | POA: Diagnosis not present

## 2020-03-17 DIAGNOSIS — C785 Secondary malignant neoplasm of large intestine and rectum: Secondary | ICD-10-CM | POA: Diagnosis not present

## 2020-03-17 DIAGNOSIS — I700 Atherosclerosis of aorta: Secondary | ICD-10-CM | POA: Diagnosis not present

## 2020-03-17 DIAGNOSIS — C7951 Secondary malignant neoplasm of bone: Secondary | ICD-10-CM | POA: Diagnosis not present

## 2020-03-17 DIAGNOSIS — C50919 Malignant neoplasm of unspecified site of unspecified female breast: Secondary | ICD-10-CM | POA: Diagnosis not present

## 2020-03-17 DIAGNOSIS — J9811 Atelectasis: Secondary | ICD-10-CM | POA: Diagnosis not present

## 2020-03-17 LAB — GLUCOSE, CAPILLARY: Glucose-Capillary: 99 mg/dL (ref 70–99)

## 2020-03-17 MED ORDER — FLUDEOXYGLUCOSE F - 18 (FDG) INJECTION
6.8900 | Freq: Once | INTRAVENOUS | Status: AC | PRN
  Administered 2020-03-17: 6.89 via INTRAVENOUS

## 2020-03-23 ENCOUNTER — Other Ambulatory Visit: Payer: Self-pay

## 2020-03-23 ENCOUNTER — Inpatient Hospital Stay (HOSPITAL_BASED_OUTPATIENT_CLINIC_OR_DEPARTMENT_OTHER): Payer: Medicare Other | Admitting: Hematology & Oncology

## 2020-03-23 ENCOUNTER — Inpatient Hospital Stay: Payer: Medicare Other | Attending: Hematology & Oncology

## 2020-03-23 VITALS — BP 121/44 | HR 70 | Temp 98.1°F | Resp 17 | Wt 137.8 lb

## 2020-03-23 DIAGNOSIS — Z170 Estrogen receptor positive status [ER+]: Secondary | ICD-10-CM | POA: Insufficient documentation

## 2020-03-23 DIAGNOSIS — C785 Secondary malignant neoplasm of large intestine and rectum: Secondary | ICD-10-CM

## 2020-03-23 DIAGNOSIS — R109 Unspecified abdominal pain: Secondary | ICD-10-CM | POA: Diagnosis not present

## 2020-03-23 DIAGNOSIS — C50912 Malignant neoplasm of unspecified site of left female breast: Secondary | ICD-10-CM | POA: Insufficient documentation

## 2020-03-23 DIAGNOSIS — Z79811 Long term (current) use of aromatase inhibitors: Secondary | ICD-10-CM | POA: Insufficient documentation

## 2020-03-23 DIAGNOSIS — C50911 Malignant neoplasm of unspecified site of right female breast: Secondary | ICD-10-CM | POA: Diagnosis not present

## 2020-03-23 DIAGNOSIS — Z79899 Other long term (current) drug therapy: Secondary | ICD-10-CM | POA: Diagnosis not present

## 2020-03-23 LAB — CMP (CANCER CENTER ONLY)
ALT: 11 U/L (ref 0–44)
AST: 18 U/L (ref 15–41)
Albumin: 4.1 g/dL (ref 3.5–5.0)
Alkaline Phosphatase: 81 U/L (ref 38–126)
Anion gap: 6 (ref 5–15)
BUN: 15 mg/dL (ref 8–23)
CO2: 29 mmol/L (ref 22–32)
Calcium: 9.7 mg/dL (ref 8.9–10.3)
Chloride: 100 mmol/L (ref 98–111)
Creatinine: 1.06 mg/dL — ABNORMAL HIGH (ref 0.44–1.00)
GFR, Est AFR Am: >60 mL/min (ref >60)
GFR, Estimated: 54 mL/min — ABNORMAL LOW (ref >60)
Glucose, Bld: 77 mg/dL (ref 70–99)
Potassium: 5.1 mmol/L (ref 3.5–5.1)
Sodium: 135 mmol/L (ref 135–145)
Total Bilirubin: 0.3 mg/dL (ref 0.3–1.2)
Total Protein: 6.9 g/dL (ref 6.5–8.1)

## 2020-03-23 LAB — CBC WITH DIFFERENTIAL (CANCER CENTER ONLY)
Abs Immature Granulocytes: 0.01 10*3/uL (ref 0.00–0.07)
Basophils Absolute: 0 10*3/uL (ref 0.0–0.1)
Basophils Relative: 1 %
Eosinophils Absolute: 0 10*3/uL (ref 0.0–0.5)
Eosinophils Relative: 1 %
HCT: 37.9 % (ref 36.0–46.0)
Hemoglobin: 12.8 g/dL (ref 12.0–15.0)
Immature Granulocytes: 0 %
Lymphocytes Relative: 32 %
Lymphs Abs: 1.2 10*3/uL (ref 0.7–4.0)
MCH: 33.1 pg (ref 26.0–34.0)
MCHC: 33.8 g/dL (ref 30.0–36.0)
MCV: 97.9 fL (ref 80.0–100.0)
Monocytes Absolute: 0.4 10*3/uL (ref 0.1–1.0)
Monocytes Relative: 10 %
Neutro Abs: 2.1 10*3/uL (ref 1.7–7.7)
Neutrophils Relative %: 56 %
Platelet Count: 109 10*3/uL — ABNORMAL LOW (ref 150–400)
RBC: 3.87 MIL/uL (ref 3.87–5.11)
RDW: 12.3 % (ref 11.5–15.5)
WBC Count: 3.7 10*3/uL — ABNORMAL LOW (ref 4.0–10.5)
nRBC: 0 % (ref 0.0–0.2)

## 2020-03-23 NOTE — Progress Notes (Signed)
Hematology and Oncology Follow Up Visit  Martha Osborne [id number] 04-10-1951 69 y.o. 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 daily  Ribociclib 600 mg p.o. daily (21/7) -- d/c due to skin rash       Verzenio 150 mg po day - started on 06/12/2019     Interim History:  Martha Osborne is back for follow-up.  As always, she looks fantastic.  She has been quite active.  She plays golf every Wednesday.  She and her husband were down in [place] visiting their son Labor Day weekend.  They had a nice time down there.  We actually did do scans on her.  She had a MRI of the left breast.  This was done a couple weeks ago.  No obvious mass was noted in the left breast.  What ever had been there had regressed and no radiographic evidence was noted..  She had a PET scan done.  The PET scan did not show any evidence of obvious metastatic disease.  Her last CA 27.29 was holding stable at 25.  She has had no problems with nausea or vomiting.  She has had some diarrhea.  Imodium seems to help this.  She has had no cough or shortness of breath.  She has had no rashes.  There is been no leg swelling..  She has had no headache.  There is no mouth sores.  Overall, her performance status is ECOG 0.   Medications:  Current Outpatient Medications:  .  abemaciclib (VERZENIO) 150 MG tablet, Take 1 tablet (150 mg total) by mouth daily. Swallow tablets whole. Do not chew, crush, or split tablets before swallowing., Disp: 30 tablet, Rfl: 3 .  Cholecalciferol (VITAMIN D3 PO), Take 2,000 Units by mouth daily., Disp: , Rfl:  .  letrozole (FEMARA) 2.5 MG tablet, Take 1 tablet (2.5 mg total) by mouth daily., Disp: 30 tablet, Rfl: 12 .  ondansetron (ZOFRAN) 4 MG tablet, Take 1 tablet (4 mg total) by mouth every 8 (eight) hours as needed for  nausea or vomiting. (Patient not taking: Reported on 10/01/2019), Disp: 20 tablet, Rfl: 2 .  psyllium (METAMUCIL) 58.6 % packet, Take 1 packet by mouth daily., Disp: , Rfl:   Allergies:  Allergies  Allergen Reactions  . Kisqali (200 Mg Dose) [Ribociclib Succ (200 Mg Dose)] Rash and Other (See Comments)    Itchy, scratchy throat.    Past Medical History, Surgical history, Social history, and Family History were reviewed and updated.  Review of Systems: Review of Systems  Constitutional: Negative.   HENT:  Negative.   Eyes: Negative.   Respiratory: Negative.   Cardiovascular: Negative.   Gastrointestinal: Positive for abdominal pain.  Endocrine: Negative.   Genitourinary: Negative.    Musculoskeletal: Negative.   Skin: Negative.   Neurological: Negative.   Hematological: Negative.   Psychiatric/Behavioral: Negative.     Physical Exam:  weight is 137 lb 12 oz (62.5 kg). Her oral temperature is 98.1 F (36.7 C). Her blood pressure is 121/44 (abnormal) and her pulse is 70. Her respiration is 17 and oxygen saturation is 99%.   Wt Readings from Last 3 Encounters:  03/23/20 137 lb 12 oz (62.5 kg)  12/16/19 137 lb (62.1 kg)  10/01/19 138 lb 1.9 oz (62.7 kg)    Physical Exam Vitals signs reviewed.  Constitutional:      Comments: Breast exam bilaterally shows no breast masses.  She has no nipple discharge bilaterally.  She has no breast swelling or erythema.  There is no bilateral axillary adenopathy.  HENT:     Head: Normocephalic and atraumatic.  Eyes:     Pupils: Pupils are equal, round, and reactive to light.  Neck:     Musculoskeletal: Normal range of motion.  Cardiovascular:     Rate and Rhythm: Normal rate and regular rhythm.     Heart sounds: Normal heart sounds.  Pulmonary:     Effort: Pulmonary effort is normal.     Breath sounds: Normal breath sounds.  Abdominal:     General: Bowel sounds are normal.     Palpations: Abdomen is soft.     Comments: Abdominal  exam shows the healing laparotomy scar.  She has no swelling.  There is no erythema about the surgical site.  She has been tenderness to palpation in the right lower quadrant.  No masses noted.  Bowel sounds are present.  There is no palpable liver or spleen tip.  Musculoskeletal: Normal range of motion.        General: No tenderness or deformity.  Lymphadenopathy:     Cervical: No cervical adenopathy.  Skin:    General: Skin is warm and dry.     Findings: No erythema or rash.  Neurological:     Mental Status: She is alert and oriented to person, place, and time.  Psychiatric:        Behavior: Behavior normal.        Thought Content: Thought content normal.        Judgment: Judgment normal.      Lab Results  Component Value Date   WBC 3.7 (L) 03/23/2020   HGB 12.8 03/23/2020   HCT 37.9 03/23/2020   MCV 97.9 03/23/2020   PLT 109 (L) 03/23/2020     Chemistry      Component Value Date/Time   NA 135 03/23/2020 0958   K 5.1 03/23/2020 0958   CL 100 03/23/2020 0958   CO2 29 03/23/2020 0958   BUN 15 03/23/2020 0958   CREATININE 1.06 (H) 03/23/2020 0958      Component Value Date/Time   CALCIUM 9.7 03/23/2020 0958   ALKPHOS 81 03/23/2020 0958   AST 18 03/23/2020 0958   ALT 11 03/23/2020 0958   BILITOT 0.3 03/23/2020 0958       Impression and Plan: Martha Osborne is a 69 year old postmenopausal female.  She has metastatic lobular carcinoma of the left breast.  We finally found their primary after doing a breast MRI.  She had resection of the colonic mass.  She had multiple positive lymph nodes.   So far, everything looks fantastic.  I am just very excited about this.  Hopefully, she will stay in remission for quite a while.  At this point, how much her if any surgeries would be needed for the left breast.  Again there is no radiographic evidence of tumor in the left breast.  I really do not think that surgery would add any benefit for her right now.  We will plan to get her  back before Thanksgiving.  I would like to get her through the holidays as she and her husband will be traveling.  [name], MD

## 2020-03-24 ENCOUNTER — Encounter: Payer: Self-pay | Admitting: *Deleted

## 2020-03-24 LAB — CANCER ANTIGEN 27.29: CA 27.29: 25.5 U/mL (ref 0.0–38.6)

## 2020-03-24 NOTE — Progress Notes (Signed)
Oncology Nurse Navigator Documentation  Oncology Nurse Navigator Flowsheets 03/24/2020  Abnormal Finding Date -  Confirmed Diagnosis Date -  Diagnosis Status -  Phase of Treatment -  Expected Surgery Date -  Navigator Follow Up Date: 05/08/2020  Navigator Follow Up Reason: Follow-up Appointment  Navigator Location [facility]  Referral Date to RadOnc/MedOnc -  Navigator Encounter Type Appt/Treatment Plan Review  Telephone -  Treatment Initiated Date -  Patient Visit Type MedOnc  Treatment Phase Active Tx  Barriers/Navigation Needs No Barriers At This Time  Education -  Interventions None Required  Acuity Level 1-No Barriers  Coordination of Care -  Education Method -  Support Groups/Services Friends and Family  Time Spent with Patient 15

## 2020-03-30 MED FILL — VERZENIO 150 MG TAB: 150 | 28 days supply | Qty: 28 | Fill #2

## 2020-04-02 ENCOUNTER — Encounter: Payer: Self-pay | Admitting: *Deleted

## 2020-04-09 ENCOUNTER — Encounter: Payer: Self-pay | Admitting: *Deleted

## 2020-04-23 DIAGNOSIS — Z23 Encounter for immunization: Secondary | ICD-10-CM | POA: Diagnosis not present

## 2020-04-28 MED FILL — VERZENIO 150 MG TAB: 150 | 28 days supply | Qty: 28 | Fill #3

## 2020-05-11 DIAGNOSIS — C785 Secondary malignant neoplasm of large intestine and rectum: Secondary | ICD-10-CM | POA: Diagnosis not present

## 2020-05-11 DIAGNOSIS — C50312 Malignant neoplasm of lower-inner quadrant of left female breast: Secondary | ICD-10-CM | POA: Diagnosis not present

## 2020-05-15 DIAGNOSIS — Z23 Encounter for immunization: Secondary | ICD-10-CM | POA: Diagnosis not present

## 2020-05-18 ENCOUNTER — Telehealth: Payer: Self-pay

## 2020-05-18 ENCOUNTER — Encounter: Payer: Self-pay | Admitting: Hematology & Oncology

## 2020-05-18 ENCOUNTER — Inpatient Hospital Stay: Payer: Medicare Other

## 2020-05-18 ENCOUNTER — Other Ambulatory Visit: Payer: Self-pay

## 2020-05-18 ENCOUNTER — Inpatient Hospital Stay: Payer: Medicare Other | Attending: Hematology & Oncology | Admitting: Hematology & Oncology

## 2020-05-18 ENCOUNTER — Encounter: Payer: Self-pay | Admitting: *Deleted

## 2020-05-18 VITALS — BP 116/51 | HR 72 | Temp 98.4°F | Resp 19 | Wt 137.0 lb

## 2020-05-18 DIAGNOSIS — C50911 Malignant neoplasm of unspecified site of right female breast: Secondary | ICD-10-CM

## 2020-05-18 DIAGNOSIS — Z79811 Long term (current) use of aromatase inhibitors: Secondary | ICD-10-CM | POA: Diagnosis not present

## 2020-05-18 DIAGNOSIS — D61818 Other pancytopenia: Secondary | ICD-10-CM | POA: Diagnosis not present

## 2020-05-18 DIAGNOSIS — Z170 Estrogen receptor positive status [ER+]: Secondary | ICD-10-CM | POA: Diagnosis not present

## 2020-05-18 DIAGNOSIS — Z79899 Other long term (current) drug therapy: Secondary | ICD-10-CM | POA: Diagnosis not present

## 2020-05-18 DIAGNOSIS — C779 Secondary and unspecified malignant neoplasm of lymph node, unspecified: Secondary | ICD-10-CM | POA: Insufficient documentation

## 2020-05-18 DIAGNOSIS — Z9049 Acquired absence of other specified parts of digestive tract: Secondary | ICD-10-CM | POA: Diagnosis not present

## 2020-05-18 DIAGNOSIS — C50912 Malignant neoplasm of unspecified site of left female breast: Secondary | ICD-10-CM | POA: Diagnosis not present

## 2020-05-18 DIAGNOSIS — R21 Rash and other nonspecific skin eruption: Secondary | ICD-10-CM | POA: Diagnosis not present

## 2020-05-18 DIAGNOSIS — C785 Secondary malignant neoplasm of large intestine and rectum: Secondary | ICD-10-CM | POA: Diagnosis not present

## 2020-05-18 DIAGNOSIS — R109 Unspecified abdominal pain: Secondary | ICD-10-CM | POA: Insufficient documentation

## 2020-05-18 LAB — CBC WITH DIFFERENTIAL (CANCER CENTER ONLY)
Abs Immature Granulocytes: 0.01 10*3/uL (ref 0.00–0.07)
Basophils Absolute: 0 10*3/uL (ref 0.0–0.1)
Basophils Relative: 1 %
Eosinophils Absolute: 0 10*3/uL (ref 0.0–0.5)
Eosinophils Relative: 1 %
HCT: 37.9 % (ref 36.0–46.0)
Hemoglobin: 12.6 g/dL (ref 12.0–15.0)
Immature Granulocytes: 0 %
Lymphocytes Relative: 33 %
Lymphs Abs: 1.1 10*3/uL (ref 0.7–4.0)
MCH: 32.9 pg (ref 26.0–34.0)
MCHC: 33.2 g/dL (ref 30.0–36.0)
MCV: 99 fL (ref 80.0–100.0)
Monocytes Absolute: 0.4 10*3/uL (ref 0.1–1.0)
Monocytes Relative: 10 %
Neutro Abs: 1.8 10*3/uL (ref 1.7–7.7)
Neutrophils Relative %: 55 %
Platelet Count: 95 10*3/uL — ABNORMAL LOW (ref 150–400)
RBC: 3.83 MIL/uL — ABNORMAL LOW (ref 3.87–5.11)
RDW: 12.5 % (ref 11.5–15.5)
WBC Count: 3.4 10*3/uL — ABNORMAL LOW (ref 4.0–10.5)
nRBC: 0 % (ref 0.0–0.2)

## 2020-05-18 LAB — CMP (CANCER CENTER ONLY)
ALT: 14 U/L (ref 0–44)
AST: 21 U/L (ref 15–41)
Albumin: 3.9 g/dL (ref 3.5–5.0)
Alkaline Phosphatase: 91 U/L (ref 38–126)
Anion gap: 7 (ref 5–15)
BUN: 13 mg/dL (ref 8–23)
CO2: 29 mmol/L (ref 22–32)
Calcium: 9.7 mg/dL (ref 8.9–10.3)
Chloride: 100 mmol/L (ref 98–111)
Creatinine: 0.98 mg/dL (ref 0.44–1.00)
GFR, Estimated: >60 mL/min (ref >60)
Glucose, Bld: 107 mg/dL — ABNORMAL HIGH (ref 70–99)
Potassium: 4.4 mmol/L (ref 3.5–5.1)
Sodium: 136 mmol/L (ref 135–145)
Total Bilirubin: 0.2 mg/dL — ABNORMAL LOW (ref 0.3–1.2)
Total Protein: 6.5 g/dL (ref 6.5–8.1)

## 2020-05-18 NOTE — Telephone Encounter (Signed)
appts made and printed for pt per 05/18/20 LOS for 06/23/20... AOM

## 2020-05-18 NOTE — Progress Notes (Signed)
Hematology and Oncology Follow Up Visit  Martha Osborne [id number] May 19, 1951 69 y.o. 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 daily  Ribociclib 600 mg p.o. daily (21/7) -- d/c due to skin rash       Verzenio 150 mg po day - started on 06/12/2019     Interim History:  Martha Osborne is back for follow-up.  Everything seems to be going pretty well with Martha.  She is looking forward to Thanksgiving as Martha Osborne and Martha Osborne will be coming up to visit.  Unfortunately, Martha Martha Osborne just had a miscarriage.  I am so sorry about this.  She and Martha husband really would like to have a child.  I noticed on Martha labs that Martha white cell count and red cell count and platelets are going down slowly.  This certainly could be from the [facility].  I would not think could be from letrozole.  Martha blood counts are not that bad as of yet so we will keep Martha on the Verzenio for right now.  She has had no problems with abdominal pain.  She had no change in bowel or bladder habits.  She has had no bleeding.  Is been no rashes.   Martha last CA 27.29 was stable at 25.  Overall, I would say Martha performance status is ECOG 1.    Medications:  Current Outpatient Medications:  .  abemaciclib (VERZENIO) 150 MG tablet, Take 1 tablet (150 mg total) by mouth daily. Swallow tablets whole. Do not chew, crush, or split tablets before swallowing., Disp: 30 tablet, Rfl: 3 .  Cholecalciferol (VITAMIN D3 PO), Take 2,000 Units by mouth daily., Disp: , Rfl:  .  letrozole (FEMARA) 2.5 MG tablet, Take 1 tablet (2.5 mg total) by mouth daily., Disp: 30 tablet, Rfl: 12 .  ondansetron (ZOFRAN) 4 MG tablet, Take 1 tablet (4 mg total) by mouth every 8 (eight) hours as needed for nausea or vomiting. (Patient not taking: Reported on 10/01/2019), Disp: 20  tablet, Rfl: 2 .  psyllium (METAMUCIL) 58.6 % packet, Take 1 packet by mouth daily., Disp: , Rfl:   Allergies:  Allergies  Allergen Reactions  . Kisqali (200 Mg Dose) [Ribociclib Succ (200 Mg Dose)] Rash and Other (See Comments)    Itchy, scratchy throat.    Past Medical History, Surgical history, Social history, and Family History were reviewed and updated.  Review of Systems: Review of Systems  Constitutional: Negative.   HENT:  Negative.   Eyes: Negative.   Respiratory: Negative.   Cardiovascular: Negative.   Gastrointestinal: Positive for abdominal pain.  Endocrine: Negative.   Genitourinary: Negative.    Musculoskeletal: Negative.   Skin: Negative.   Neurological: Negative.   Hematological: Negative.   Psychiatric/Behavioral: Negative.     Physical Exam:  weight is 137 lb (62.1 kg). Martha oral temperature is 98.4 F (36.9 C). Martha blood pressure is 116/51 (abnormal) and Martha pulse is 72. Martha respiration is 19 and oxygen saturation is 100%.   Wt Readings from Last 3 Encounters:  05/18/20 137 lb (62.1 kg)  03/23/20 137 lb 12 oz (62.5 kg)  12/16/19 137 lb (62.1 kg)    Physical Exam Vitals signs reviewed.  Constitutional:      Comments: Breast exam bilaterally shows no breast masses.  She has no nipple discharge bilaterally.  She has no breast swelling or erythema.  There is no bilateral axillary adenopathy.  HENT:     Head: Normocephalic and atraumatic.  Eyes:     Pupils: Pupils are equal, round, and reactive to light.  Neck:     Musculoskeletal: Normal range of motion.  Cardiovascular:     Rate and Rhythm: Normal rate and regular rhythm.     Heart sounds: Normal heart sounds.  Pulmonary:     Effort: Pulmonary effort is normal.     Breath sounds: Normal breath sounds.  Abdominal:     General: Bowel sounds are normal.     Palpations: Abdomen is soft.     Comments: Abdominal exam shows the healing laparotomy scar.  She has no swelling.  There is no erythema about  the surgical site.  She has been tenderness to palpation in the right lower quadrant.  No masses noted.  Bowel sounds are present.  There is no palpable liver or spleen tip.  Musculoskeletal: Normal range of motion.        General: No tenderness or deformity.  Lymphadenopathy:     Cervical: No cervical adenopathy.  Skin:    General: Skin is warm and dry.     Findings: No erythema or rash.  Neurological:     Mental Status: She is alert and oriented to person, place, and time.  Psychiatric:        Behavior: Behavior normal.        Thought Content: Thought content normal.        Judgment: Judgment normal.      Lab Results  Component Value Date   WBC 3.4 (L) 05/18/2020   HGB 12.6 05/18/2020   HCT 37.9 05/18/2020   MCV 99.0 05/18/2020   PLT 95 (L) 05/18/2020     Chemistry      Component Value Date/Time   NA 136 05/18/2020 1019   K 4.4 05/18/2020 1019   CL 100 05/18/2020 1019   CO2 29 05/18/2020 1019   BUN 13 05/18/2020 1019   CREATININE 0.98 05/18/2020 1019      Component Value Date/Time   CALCIUM 9.7 05/18/2020 1019   ALKPHOS 91 05/18/2020 1019   AST 21 05/18/2020 1019   ALT 14 05/18/2020 1019   BILITOT 0.2 (L) 05/18/2020 1019       Impression and Plan: Martha Osborne is a 68 year old postmenopausal female.  She has metastatic lobular carcinoma of the left breast.  We finally found Martha primary after doing a breast MRI.  She had resection of the colonic mass.  She had multiple positive lymph nodes.   So far, everything looks fantastic.  I am just very excited about this.  Hopefully, she will stay in remission for quite a while.  Again I am not sure why the pancytopenia.  We will have to watch this closely.  I looked at Martha blood smear and really do not see anything that looked significant.  This might just be an effect from the [employer].  I would like to get Martha back in 6 weeks.  I want to keep close check on Martha blood counts.  I do not see that we have to do any scans  on Martha for a while.  [name], MD

## 2020-05-19 ENCOUNTER — Encounter: Payer: Self-pay | Admitting: *Deleted

## 2020-05-19 LAB — CANCER ANTIGEN 27.29: CA 27.29: 21.7 U/mL (ref 0.0–38.6)

## 2020-05-19 NOTE — Progress Notes (Signed)
Oncology Nurse Navigator Documentation  Oncology Nurse Navigator Flowsheets 05/19/2020  Abnormal Finding Date -  Confirmed Diagnosis Date -  Diagnosis Status -  Phase of Treatment -  Expected Surgery Date -  Navigator Follow Up Date: 06/23/2020  Navigator Follow Up Reason: Follow-up Appointment  Navigator Location [facility]  Referral Date to RadOnc/MedOnc -  Navigator Encounter Type Appt/Treatment Plan Review;MyChart  Telephone -  Treatment Initiated Date -  Patient Visit Type MedOnc  Treatment Phase Active Tx  Barriers/Navigation Needs Coordination of Care  Education -  Interventions Coordination of Care;Psycho-Social Support  Acuity Level 2-Minimal Needs (1-2 Barriers Identified)  Coordination of Care Other  Education Method -  Support Groups/Services Friends and Family  Time Spent with Patient 15

## 2020-05-25 ENCOUNTER — Other Ambulatory Visit: Payer: Self-pay | Admitting: Hematology & Oncology

## 2020-05-25 DIAGNOSIS — C50911 Malignant neoplasm of unspecified site of right female breast: Secondary | ICD-10-CM

## 2020-05-25 DIAGNOSIS — C785 Secondary malignant neoplasm of large intestine and rectum: Secondary | ICD-10-CM

## 2020-05-25 MED ORDER — ABEMACICLIB 150 MG PO TABS: 150.0000 mg | ORAL_TABLET | Freq: Every day | ORAL | 6 refills | Status: DC

## 2020-05-25 MED FILL — VERZENIO 150 MG TAB: 150 | 28 days supply | Qty: 28 | Fill #0

## 2020-05-25 NOTE — Addendum Note (Signed)
Addended by: Darl Pikes on: 05/25/2020 10:56 AM   Modules accepted: Orders

## 2020-06-19 MED FILL — VERZENIO 150 MG TAB: 150 | 28 days supply | Qty: 28 | Fill #1

## 2020-06-23 ENCOUNTER — Inpatient Hospital Stay (HOSPITAL_BASED_OUTPATIENT_CLINIC_OR_DEPARTMENT_OTHER): Payer: Medicare Other | Admitting: Hematology & Oncology

## 2020-06-23 ENCOUNTER — Encounter: Payer: Self-pay | Admitting: Hematology & Oncology

## 2020-06-23 ENCOUNTER — Other Ambulatory Visit: Payer: Self-pay

## 2020-06-23 ENCOUNTER — Inpatient Hospital Stay: Payer: Medicare Other | Attending: Hematology & Oncology

## 2020-06-23 ENCOUNTER — Encounter: Payer: Self-pay | Admitting: *Deleted

## 2020-06-23 VITALS — BP 128/45 | HR 69 | Temp 99.1°F | Resp 18 | Wt 137.5 lb

## 2020-06-23 DIAGNOSIS — Z79899 Other long term (current) drug therapy: Secondary | ICD-10-CM | POA: Diagnosis not present

## 2020-06-23 DIAGNOSIS — C785 Secondary malignant neoplasm of large intestine and rectum: Secondary | ICD-10-CM | POA: Diagnosis not present

## 2020-06-23 DIAGNOSIS — Z170 Estrogen receptor positive status [ER+]: Secondary | ICD-10-CM | POA: Insufficient documentation

## 2020-06-23 DIAGNOSIS — C50912 Malignant neoplasm of unspecified site of left female breast: Secondary | ICD-10-CM | POA: Insufficient documentation

## 2020-06-23 DIAGNOSIS — R109 Unspecified abdominal pain: Secondary | ICD-10-CM | POA: Insufficient documentation

## 2020-06-23 DIAGNOSIS — C50911 Malignant neoplasm of unspecified site of right female breast: Secondary | ICD-10-CM | POA: Diagnosis not present

## 2020-06-23 DIAGNOSIS — Z79811 Long term (current) use of aromatase inhibitors: Secondary | ICD-10-CM | POA: Diagnosis not present

## 2020-06-23 LAB — CBC WITH DIFFERENTIAL (CANCER CENTER ONLY)
Abs Immature Granulocytes: 0.02 10*3/uL (ref 0.00–0.07)
Basophils Absolute: 0.1 10*3/uL (ref 0.0–0.1)
Basophils Relative: 1 %
Eosinophils Absolute: 0.1 10*3/uL (ref 0.0–0.5)
Eosinophils Relative: 1 %
HCT: 38.2 % (ref 36.0–46.0)
Hemoglobin: 12.7 g/dL (ref 12.0–15.0)
Immature Granulocytes: 1 %
Lymphocytes Relative: 32 %
Lymphs Abs: 1.3 10*3/uL (ref 0.7–4.0)
MCH: 33.2 pg (ref 26.0–34.0)
MCHC: 33.2 g/dL (ref 30.0–36.0)
MCV: 99.7 fL (ref 80.0–100.0)
Monocytes Absolute: 0.3 10*3/uL (ref 0.1–1.0)
Monocytes Relative: 8 %
Neutro Abs: 2.3 10*3/uL (ref 1.7–7.7)
Neutrophils Relative %: 57 %
Platelet Count: 114 10*3/uL — ABNORMAL LOW (ref 150–400)
RBC: 3.83 MIL/uL — ABNORMAL LOW (ref 3.87–5.11)
RDW: 12.6 % (ref 11.5–15.5)
WBC Count: 4.1 10*3/uL (ref 4.0–10.5)
nRBC: 0 % (ref 0.0–0.2)

## 2020-06-23 LAB — CMP (CANCER CENTER ONLY)
ALT: 12 U/L (ref 0–44)
AST: 18 U/L (ref 15–41)
Albumin: 3.9 g/dL (ref 3.5–5.0)
Alkaline Phosphatase: 78 U/L (ref 38–126)
Anion gap: 6 (ref 5–15)
BUN: 16 mg/dL (ref 8–23)
CO2: 30 mmol/L (ref 22–32)
Calcium: 9.7 mg/dL (ref 8.9–10.3)
Chloride: 103 mmol/L (ref 98–111)
Creatinine: 1.01 mg/dL — ABNORMAL HIGH (ref 0.44–1.00)
GFR, Estimated: >60 mL/min (ref >60)
Glucose, Bld: 84 mg/dL (ref 70–99)
Potassium: 4.4 mmol/L (ref 3.5–5.1)
Sodium: 139 mmol/L (ref 135–145)
Total Bilirubin: 0.3 mg/dL (ref 0.3–1.2)
Total Protein: 6.7 g/dL (ref 6.5–8.1)

## 2020-06-23 LAB — SAVE SMEAR(SSMR), FOR PROVIDER SLIDE REVIEW

## 2020-06-23 NOTE — Progress Notes (Signed)
Oncology Nurse Navigator Documentation  Oncology Nurse Navigator Flowsheets 06/23/2020  Abnormal Finding Date -  Confirmed Diagnosis Date -  Diagnosis Status -  Phase of Treatment -  Expected Surgery Date -  Navigator Follow Up Date: -  Navigator Follow Up Reason: -  Navigation Complete Date: 06/23/2020  Post Navigation: Continue to Follow Patient? No  Reason Not Navigating Patient: Patient On Maintenance Chemotherapy  Navigator Location [facility]  Referral Date to RadOnc/MedOnc -  Navigator Encounter Type Appt/Treatment Plan Review  Telephone -  Treatment Initiated Date -  Patient Visit Type MedOnc  Treatment Phase Active Tx  Barriers/Navigation Needs No Barriers At This Time  Education -  Interventions None Required  Acuity Level 1-No Barriers  Coordination of Care -  Education Method -  Support Groups/Services Friends and Family  Time Spent with Patient 15

## 2020-06-23 NOTE — Progress Notes (Signed)
Hematology and Oncology Follow Up Visit  Martha Osborne [id number] 07-Aug-1950 69 y.o. 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 daily  Ribociclib 600 mg p.o. daily (21/7) -- d/c due to skin rash       Verzenio 150 mg po day - started on 06/12/2019     Interim History:  Martha Osborne is back for follow-up.  I had Martha come back little bit early because Martha platelet count was dropping.  I was not sure as to why the platelet count was going down.  Thankfully, the platelet count is back up.  The platelet count was 114,000 today.  She has had no problems with bleeding or bruising.  She has had no issues with Martha appetite.  There is no change in bowel or bladder habits.  The big news is that she and Martha Osborne might be going down to [place] to live.  They are going to try to sell Martha house next year.  They are thinking about moving down to [place] and be closer to family.  This I can understand.  They are leaving on Thursday to head down to [place] for Christmas.  Of note, Martha last CA 27.29 was 22.  This is holding steady.  Martha last PET scan was back in September.  As long as the CA 27.29 is holding steady, I really do not think we have to put Martha through a PET scan.  Overall, Martha performance status is ECOG 0.      Medications:  Current Outpatient Medications:  .  abemaciclib (VERZENIO) 150 MG tablet, Take 1 tablet (150 mg total) by mouth daily., Disp: 28 tablet, Rfl: 6 .  Cholecalciferol (VITAMIN D3 PO), Take 2,000 Units by mouth daily., Disp: , Rfl:  .  letrozole (FEMARA) 2.5 MG tablet, Take 1 tablet (2.5 mg total) by mouth daily., Disp: 30 tablet, Rfl: 12 .  Psyllium (METAMUCIL) 48.57 % POWD, 1 packet with 8 ounces of liquid as needed, Disp: , Rfl:  .  ondansetron (ZOFRAN) 4 MG tablet, Take 1 tablet (4 mg  total) by mouth every 8 (eight) hours as needed for nausea or vomiting. (Patient not taking: No sig reported), Disp: 20 tablet, Rfl: 2 .  psyllium (METAMUCIL) 58.6 % packet, Take 1 packet by mouth daily., Disp: , Rfl:   Allergies:  Allergies  Allergen Reactions  . Kisqali (200 Mg Dose) [Ribociclib Succ (200 Mg Dose)] Rash and Other (See Comments)    Itchy, scratchy throat.    Past Medical History, Surgical history, Social history, and Family History were reviewed and updated.  Review of Systems: Review of Systems  Constitutional: Negative.   HENT:  Negative.   Eyes: Negative.   Respiratory: Negative.   Cardiovascular: Negative.   Gastrointestinal: Positive for abdominal pain.  Endocrine: Negative.   Genitourinary: Negative.    Musculoskeletal: Negative.   Skin: Negative.   Neurological: Negative.   Hematological: Negative.   Psychiatric/Behavioral: Negative.     Physical Exam:  weight is 137 lb 8 oz (62.4 kg). Martha oral temperature is 99.1 F (37.3 C). Martha blood pressure is 128/45 (abnormal) and Martha pulse is 69. Martha respiration is 18 and oxygen saturation is 100%.   Wt Readings from Last 3 Encounters:  06/23/20 137 lb 8 oz (62.4 kg)  05/18/20 137 lb (62.1 kg)  03/23/20 137 lb 12 oz (62.5 kg)    Physical Exam Vitals signs reviewed.  Constitutional:      Comments: Breast exam bilaterally shows no breast masses.  She has no nipple discharge bilaterally.  She has no breast swelling or erythema.  There is no bilateral axillary adenopathy.  HENT:     Head: Normocephalic and atraumatic.  Eyes:     Pupils: Pupils are equal, round, and reactive to light.  Neck:     Musculoskeletal: Normal range of motion.  Cardiovascular:     Rate and Rhythm: Normal rate and regular rhythm.     Heart sounds: Normal heart sounds.  Pulmonary:     Effort: Pulmonary effort is normal.     Breath sounds: Normal breath sounds.  Abdominal:     General: Bowel sounds are normal.     Palpations:  Abdomen is soft.     Comments: Abdominal exam shows the healing laparotomy scar.  She has no swelling.  There is no erythema about the surgical site.  She has been tenderness to palpation in the right lower quadrant.  No masses noted.  Bowel sounds are present.  There is no palpable liver or spleen tip.  Musculoskeletal: Normal range of motion.        General: No tenderness or deformity.  Lymphadenopathy:     Cervical: No cervical adenopathy.  Skin:    General: Skin is warm and dry.     Findings: No erythema or rash.  Neurological:     Mental Status: She is alert and oriented to person, place, and time.  Psychiatric:        Behavior: Behavior normal.        Thought Content: Thought content normal.        Judgment: Judgment normal.      Lab Results  Component Value Date   WBC 4.1 06/23/2020   HGB 12.7 06/23/2020   HCT 38.2 06/23/2020   MCV 99.7 06/23/2020   PLT 114 (L) 06/23/2020     Chemistry      Component Value Date/Time   NA 139 06/23/2020 0813   K 4.4 06/23/2020 0813   CL 103 06/23/2020 0813   CO2 30 06/23/2020 0813   BUN 16 06/23/2020 0813   CREATININE 1.01 (H) 06/23/2020 0813      Component Value Date/Time   CALCIUM 9.7 06/23/2020 0813   ALKPHOS 78 06/23/2020 0813   AST 18 06/23/2020 0813   ALT 12 06/23/2020 0813   BILITOT 0.3 06/23/2020 0813       Impression and Plan: Martha Osborne is a 69 year old postmenopausal female.  She has metastatic lobular carcinoma of the left breast.  We finally found Martha primary after doing a breast MRI.  She had resection of the colonic mass.  She had multiple positive lymph nodes.   Martha last MRI of the breast, back in September, did not show any residual breast mass in the left breast.  As such, there is no need for surgery.  We will now get Martha through the winter.  I will plan to get Martha back in early March.  I think this would be reasonable so that we can see if she and Martha Osborne will be moving.    Martha Haw, MD

## 2020-06-24 LAB — CANCER ANTIGEN 27.29: CA 27.29: 19.8 U/mL (ref 0.0–38.6)

## 2020-07-15 MED FILL — VERZENIO 150 MG TAB: 150 | 28 days supply | Qty: 28 | Fill #2

## 2020-08-04 ENCOUNTER — Encounter: Payer: Self-pay | Admitting: Hematology & Oncology

## 2020-08-12 MED FILL — VERZENIO 150 MG TAB: 150 | 28 days supply | Qty: 28 | Fill #3

## 2020-08-13 ENCOUNTER — Other Ambulatory Visit: Payer: Self-pay | Admitting: *Deleted

## 2020-08-13 DIAGNOSIS — C50919 Malignant neoplasm of unspecified site of unspecified female breast: Secondary | ICD-10-CM

## 2020-08-13 DIAGNOSIS — C50911 Malignant neoplasm of unspecified site of right female breast: Secondary | ICD-10-CM

## 2020-08-17 DIAGNOSIS — Z85828 Personal history of other malignant neoplasm of skin: Secondary | ICD-10-CM | POA: Diagnosis not present

## 2020-08-17 DIAGNOSIS — D225 Melanocytic nevi of trunk: Secondary | ICD-10-CM | POA: Diagnosis not present

## 2020-08-17 DIAGNOSIS — L570 Actinic keratosis: Secondary | ICD-10-CM | POA: Diagnosis not present

## 2020-08-17 DIAGNOSIS — R238 Other skin changes: Secondary | ICD-10-CM | POA: Diagnosis not present

## 2020-08-17 DIAGNOSIS — L821 Other seborrheic keratosis: Secondary | ICD-10-CM | POA: Diagnosis not present

## 2020-08-17 DIAGNOSIS — D1800 Hemangioma unspecified site: Secondary | ICD-10-CM | POA: Diagnosis not present

## 2020-08-17 DIAGNOSIS — R202 Paresthesia of skin: Secondary | ICD-10-CM | POA: Diagnosis not present

## 2020-09-02 ENCOUNTER — Telehealth: Payer: Self-pay | Admitting: *Deleted

## 2020-09-02 NOTE — Telephone Encounter (Signed)
Faxed referral to [facility] - Dr. Therisa Doyne to [phone].

## 2020-09-11 ENCOUNTER — Inpatient Hospital Stay: Payer: Medicare Other | Attending: Hematology & Oncology

## 2020-09-11 ENCOUNTER — Inpatient Hospital Stay (HOSPITAL_BASED_OUTPATIENT_CLINIC_OR_DEPARTMENT_OTHER): Payer: Medicare Other | Admitting: Hematology & Oncology

## 2020-09-11 ENCOUNTER — Encounter: Payer: Self-pay | Admitting: Hematology & Oncology

## 2020-09-11 ENCOUNTER — Other Ambulatory Visit: Payer: Self-pay | Admitting: Hematology & Oncology

## 2020-09-11 ENCOUNTER — Other Ambulatory Visit: Payer: Self-pay

## 2020-09-11 VITALS — BP 120/55 | HR 71 | Temp 98.8°F | Resp 16 | Wt 137.0 lb

## 2020-09-11 DIAGNOSIS — C50911 Malignant neoplasm of unspecified site of right female breast: Secondary | ICD-10-CM

## 2020-09-11 DIAGNOSIS — Z9049 Acquired absence of other specified parts of digestive tract: Secondary | ICD-10-CM | POA: Insufficient documentation

## 2020-09-11 DIAGNOSIS — Z79811 Long term (current) use of aromatase inhibitors: Secondary | ICD-10-CM | POA: Diagnosis not present

## 2020-09-11 DIAGNOSIS — Z79899 Other long term (current) drug therapy: Secondary | ICD-10-CM | POA: Insufficient documentation

## 2020-09-11 DIAGNOSIS — R21 Rash and other nonspecific skin eruption: Secondary | ICD-10-CM | POA: Insufficient documentation

## 2020-09-11 DIAGNOSIS — C50912 Malignant neoplasm of unspecified site of left female breast: Secondary | ICD-10-CM | POA: Diagnosis not present

## 2020-09-11 DIAGNOSIS — C785 Secondary malignant neoplasm of large intestine and rectum: Secondary | ICD-10-CM

## 2020-09-11 DIAGNOSIS — Z170 Estrogen receptor positive status [ER+]: Secondary | ICD-10-CM | POA: Insufficient documentation

## 2020-09-11 DIAGNOSIS — R109 Unspecified abdominal pain: Secondary | ICD-10-CM | POA: Diagnosis not present

## 2020-09-11 LAB — CBC WITH DIFFERENTIAL (CANCER CENTER ONLY)
Abs Immature Granulocytes: 0.02 10*3/uL (ref 0.00–0.07)
Basophils Absolute: 0.1 10*3/uL (ref 0.0–0.1)
Basophils Relative: 1 %
Eosinophils Absolute: 0 10*3/uL (ref 0.0–0.5)
Eosinophils Relative: 1 %
HCT: 38.9 % (ref 36.0–46.0)
Hemoglobin: 13 g/dL (ref 12.0–15.0)
Immature Granulocytes: 0 %
Lymphocytes Relative: 32 %
Lymphs Abs: 1.4 10*3/uL (ref 0.7–4.0)
MCH: 32.7 pg (ref 26.0–34.0)
MCHC: 33.4 g/dL (ref 30.0–36.0)
MCV: 97.7 fL (ref 80.0–100.0)
Monocytes Absolute: 0.3 10*3/uL (ref 0.1–1.0)
Monocytes Relative: 7 %
Neutro Abs: 2.6 10*3/uL (ref 1.7–7.7)
Neutrophils Relative %: 59 %
Platelet Count: 115 10*3/uL — ABNORMAL LOW (ref 150–400)
RBC: 3.98 MIL/uL (ref 3.87–5.11)
RDW: 12.5 % (ref 11.5–15.5)
WBC Count: 4.5 10*3/uL (ref 4.0–10.5)
nRBC: 0 % (ref 0.0–0.2)

## 2020-09-11 LAB — CMP (CANCER CENTER ONLY)
ALT: 14 U/L (ref 0–44)
AST: 20 U/L (ref 15–41)
Albumin: 4.1 g/dL (ref 3.5–5.0)
Alkaline Phosphatase: 90 U/L (ref 38–126)
Anion gap: 5 (ref 5–15)
BUN: 17 mg/dL (ref 8–23)
CO2: 31 mmol/L (ref 22–32)
Calcium: 9.8 mg/dL (ref 8.9–10.3)
Chloride: 102 mmol/L (ref 98–111)
Creatinine: 1.07 mg/dL — ABNORMAL HIGH (ref 0.44–1.00)
GFR, Estimated: 56 mL/min — ABNORMAL LOW (ref >60)
Glucose, Bld: 82 mg/dL (ref 70–99)
Potassium: 4.9 mmol/L (ref 3.5–5.1)
Sodium: 138 mmol/L (ref 135–145)
Total Bilirubin: 0.3 mg/dL (ref 0.3–1.2)
Total Protein: 6.6 g/dL (ref 6.5–8.1)

## 2020-09-11 MED ORDER — LETROZOLE 2.5 MG PO TABS: 2.5000 mg | ORAL_TABLET | Freq: Every day | ORAL | 6 refills | Status: DC

## 2020-09-11 MED FILL — LETROZOLE 2.5 MG TABLET: 2.5 | 90 days supply | Qty: 90 | Fill #0

## 2020-09-11 NOTE — Addendum Note (Signed)
Addended by: Burney Gauze R on: 09/11/2020 03:45 PM   Modules accepted: Orders

## 2020-09-11 NOTE — Progress Notes (Signed)
Hematology and Oncology Follow Up Visit  Martha Osborne [id number] 1951/07/01 70 y.o. 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 daily  Ribociclib 600 mg p.o. daily (21/7) -- d/c due to skin rash       Verzenio 150 mg po day - started on 06/12/2019     Interim History:  Martha Os[place] is back for follow-up.  As always, she looks quite good.  She is quite busy right now.  She and her husband are actually moving into a different place in [place].  There will be there for a while before they decide about moving down to [place].  It sounds like they may be putting their house up for sale sometime in the late spring.  She has had no problems over the wintertime.  She has been fairly active.  She is doing well with the letrozole and the Verzenio.  She has had no problems with nausea or vomiting.  She has had no diarrhea.  She has had no rashes.  Her last CA 27.29 was 20 which is holding stable.  Her platelet count is holding steady which is also nice to see.  She has had no problems with fever.  There is no cough or shortness of breath.  She has had no leg swelling.  She has had no headache.  Overall, performance status is ECOG 1.     Medications:  Current Outpatient Medications:  .  abemaciclib (VERZENIO) 150 MG tablet, Take 1 tablet (150 mg total) by mouth daily., Disp: 28 tablet, Rfl: 6 .  Cholecalciferol (VITAMIN D3 PO), Take 2,000 Units by mouth daily., Disp: , Rfl:  .  letrozole (FEMARA) 2.5 MG tablet, Take 1 tablet (2.5 mg total) by mouth daily., Disp: 30 tablet, Rfl: 12 .  ondansetron (ZOFRAN) 4 MG tablet, Take 1 tablet (4 mg total) by mouth every 8 (eight) hours as needed for nausea or vomiting. (Patient not taking: No sig reported), Disp: 20 tablet, Rfl: 2 .  Psyllium (METAMUCIL) 48.57 % POWD, 1 packet  with 8 ounces of liquid as needed, Disp: , Rfl:   Allergies:  Allergies  Allergen Reactions  . Other Other (See Comments)  . Kisqali (200 Mg Dose) [Ribociclib Succ (200 Mg Dose)] Rash and Other (See Comments)    Itchy, scratchy throat.    Past Medical History, Surgical history, Social history, and Family History were reviewed and updated.  Review of Systems: Review of Systems  Constitutional: Negative.   HENT:  Negative.   Eyes: Negative.   Respiratory: Negative.   Cardiovascular: Negative.   Gastrointestinal: Positive for abdominal pain.  Endocrine: Negative.   Genitourinary: Negative.    Musculoskeletal: Negative.   Skin: Negative.   Neurological: Negative.   Hematological: Negative.   Psychiatric/Behavioral: Negative.     Physical Exam:  weight is 137 lb (62.1 kg). Her oral temperature is 98.8 F (37.1 C). Her blood pressure is 120/55 (abnormal) and her pulse is 71. Her respiration is 16 and oxygen saturation is 100%.   Wt Readings from Last 3 Encounters:  09/11/20 137 lb (62.1 kg)  06/23/20 137 lb 8 oz (62.4 kg)  05/18/20 137 lb (62.1 kg)    Physical Exam Vitals signs reviewed.  Constitutional:      Comments: Breast exam bilaterally shows no breast masses.  She has no nipple  discharge bilaterally.  She has no breast swelling or erythema.  There is no bilateral axillary adenopathy.  HENT:     Head: Normocephalic and atraumatic.  Eyes:     Pupils: Pupils are equal, round, and reactive to light.  Neck:     Musculoskeletal: Normal range of motion.  Cardiovascular:     Rate and Rhythm: Normal rate and regular rhythm.     Heart sounds: Normal heart sounds.  Pulmonary:     Effort: Pulmonary effort is normal.     Breath sounds: Normal breath sounds.  Abdominal:     General: Bowel sounds are normal.     Palpations: Abdomen is soft.     Comments: Abdominal exam shows the healing laparotomy scar.  She has no swelling.  There is no erythema about the surgical site.   She has been tenderness to palpation in the right lower quadrant.  No masses noted.  Bowel sounds are present.  There is no palpable liver or spleen tip.  Musculoskeletal: Normal range of motion.        General: No tenderness or deformity.  Lymphadenopathy:     Cervical: No cervical adenopathy.  Skin:    General: Skin is warm and dry.     Findings: No erythema or rash.  Neurological:     Mental Status: She is alert and oriented to person, place, and time.  Psychiatric:        Behavior: Behavior normal.        Thought Content: Thought content normal.        Judgment: Judgment normal.      Lab Results  Component Value Date   WBC 4.5 09/11/2020   HGB 13.0 09/11/2020   HCT 38.9 09/11/2020   MCV 97.7 09/11/2020   PLT 115 (L) 09/11/2020     Chemistry      Component Value Date/Time   NA 138 09/11/2020 0934   K 4.9 09/11/2020 0934   CL 102 09/11/2020 0934   CO2 31 09/11/2020 0934   BUN 17 09/11/2020 0934   CREATININE 1.07 (H) 09/11/2020 0934      Component Value Date/Time   CALCIUM 9.8 09/11/2020 0934   ALKPHOS 90 09/11/2020 0934   AST 20 09/11/2020 0934   ALT 14 09/11/2020 0934   BILITOT 0.3 09/11/2020 0934       Impression and Plan: [place] is a 70 year old postmenopausal female.  She has metastatic lobular carcinoma of the left breast.  We finally found her primary after doing a breast MRI.  She had resection of the colonic mass.  She had multiple positive lymph nodes.    We will go ahead and set her up with a PET scan.  Her last one was done back in September of last year.  We will set this up for April.  I am just glad that her quality of life is doing well.  I know she will be very busy with the move.  It is truly a process.  She and her husband are trying to take it slowly.  We will plan to see her back in about 2 months.   [name], MD

## 2020-09-12 LAB — CANCER ANTIGEN 27.29: CA 27.29: 24 U/mL (ref 0.0–38.6)

## 2020-09-16 DIAGNOSIS — D485 Neoplasm of uncertain behavior of skin: Secondary | ICD-10-CM | POA: Diagnosis not present

## 2020-09-16 DIAGNOSIS — C44622 Squamous cell carcinoma of skin of right upper limb, including shoulder: Secondary | ICD-10-CM | POA: Diagnosis not present

## 2020-09-22 ENCOUNTER — Encounter: Payer: Self-pay | Admitting: *Deleted

## 2020-09-29 ENCOUNTER — Other Ambulatory Visit (HOSPITAL_COMMUNITY): Payer: Self-pay

## 2020-09-29 ENCOUNTER — Ambulatory Visit (HOSPITAL_BASED_OUTPATIENT_CLINIC_OR_DEPARTMENT_OTHER)
Admission: RE | Admit: 2020-09-29 | Discharge: 2020-09-29 | Disposition: A | Discharge status: Home / Self Care | Payer: Medicare Other | Source: Ambulatory Visit | Attending: Hematology & Oncology | Admitting: Hematology & Oncology

## 2020-09-29 ENCOUNTER — Other Ambulatory Visit: Payer: Self-pay

## 2020-09-29 DIAGNOSIS — Z9289 Personal history of other medical treatment: Secondary | ICD-10-CM

## 2020-09-29 DIAGNOSIS — E559 Vitamin D deficiency, unspecified: Secondary | ICD-10-CM | POA: Diagnosis not present

## 2020-09-29 DIAGNOSIS — Z780 Asymptomatic menopausal state: Secondary | ICD-10-CM | POA: Insufficient documentation

## 2020-09-29 DIAGNOSIS — M810 Age-related osteoporosis without current pathological fracture: Secondary | ICD-10-CM | POA: Insufficient documentation

## 2020-09-29 DIAGNOSIS — C785 Secondary malignant neoplasm of large intestine and rectum: Secondary | ICD-10-CM | POA: Diagnosis not present

## 2020-09-29 DIAGNOSIS — Z13820 Encounter for screening for osteoporosis: Secondary | ICD-10-CM | POA: Insufficient documentation

## 2020-09-29 DIAGNOSIS — C50911 Malignant neoplasm of unspecified site of right female breast: Secondary | ICD-10-CM | POA: Insufficient documentation

## 2020-09-29 HISTORY — DX: Personal history of other medical treatment: Z92.89

## 2020-09-30 ENCOUNTER — Telehealth: Payer: Self-pay

## 2020-09-30 ENCOUNTER — Other Ambulatory Visit: Payer: Self-pay | Admitting: Hematology & Oncology

## 2020-09-30 NOTE — Telephone Encounter (Signed)
-----   Message from Volanda Napoleon, MD sent at 09/30/2020  2:22 PM EDT ----- Call - tell her that her bones are OSTEOPOROTIC.  She really needs Prolia.  I am sure that she may not want to do this.  She is on vit D already.  The Prolia is only 2x a year.  If she agrees, let me know and I will set this up when we see her back.  Laurey Arrow

## 2020-09-30 NOTE — Telephone Encounter (Signed)
Called to inform patient of bone density results, left message for her to call back to go over

## 2020-10-06 ENCOUNTER — Inpatient Hospital Stay: Payer: Medicare Other | Attending: Hematology & Oncology

## 2020-10-06 ENCOUNTER — Other Ambulatory Visit: Payer: Self-pay

## 2020-10-06 VITALS — BP 129/45 | HR 72 | Temp 98.7°F | Resp 15

## 2020-10-06 DIAGNOSIS — Z170 Estrogen receptor positive status [ER+]: Secondary | ICD-10-CM | POA: Insufficient documentation

## 2020-10-06 DIAGNOSIS — C50911 Malignant neoplasm of unspecified site of right female breast: Secondary | ICD-10-CM

## 2020-10-06 DIAGNOSIS — Z79811 Long term (current) use of aromatase inhibitors: Secondary | ICD-10-CM | POA: Diagnosis not present

## 2020-10-06 DIAGNOSIS — Z79899 Other long term (current) drug therapy: Secondary | ICD-10-CM | POA: Diagnosis not present

## 2020-10-06 DIAGNOSIS — C50912 Malignant neoplasm of unspecified site of left female breast: Secondary | ICD-10-CM | POA: Insufficient documentation

## 2020-10-06 DIAGNOSIS — Z9049 Acquired absence of other specified parts of digestive tract: Secondary | ICD-10-CM | POA: Insufficient documentation

## 2020-10-06 DIAGNOSIS — R110 Nausea: Secondary | ICD-10-CM | POA: Diagnosis not present

## 2020-10-06 DIAGNOSIS — C785 Secondary malignant neoplasm of large intestine and rectum: Secondary | ICD-10-CM | POA: Insufficient documentation

## 2020-10-06 DIAGNOSIS — C779 Secondary and unspecified malignant neoplasm of lymph node, unspecified: Secondary | ICD-10-CM | POA: Diagnosis not present

## 2020-10-06 DIAGNOSIS — R21 Rash and other nonspecific skin eruption: Secondary | ICD-10-CM | POA: Insufficient documentation

## 2020-10-06 DIAGNOSIS — M810 Age-related osteoporosis without current pathological fracture: Secondary | ICD-10-CM | POA: Insufficient documentation

## 2020-10-06 MED ORDER — DENOSUMAB 60 MG/ML ~~LOC~~ SOSY
PREFILLED_SYRINGE | SUBCUTANEOUS | Status: AC
  Filled 2020-10-06: qty 1

## 2020-10-06 MED ORDER — DENOSUMAB 60 MG/ML ~~LOC~~ SOSY
60.0000 mg | PREFILLED_SYRINGE | Freq: Once | SUBCUTANEOUS | Status: AC
Start: 2020-10-06 — End: 2020-10-06
  Administered 2020-10-06: 60 mg via SUBCUTANEOUS

## 2020-10-06 NOTE — Patient Instructions (Signed)
Denosumab injection What is this medicine? DENOSUMAB (den oh sue mab) slows bone breakdown. Prolia is used to treat osteoporosis in women after menopause and in men, and in people who are taking corticosteroids for 6 months or more. Xgeva is used to treat a high calcium level due to cancer and to prevent bone fractures and other bone problems caused by multiple myeloma or cancer bone metastases. Xgeva is also used to treat giant cell tumor of the bone. This medicine may be used for other purposes; ask your health care provider or pharmacist if you have questions. COMMON BRAND NAME(S): Prolia, XGEVA What should I tell my health care provider before I take this medicine? They need to know if you have any of these conditions:  dental disease  having surgery or tooth extraction  infection  kidney disease  low levels of calcium or Vitamin D in the blood  malnutrition  on hemodialysis  skin conditions or sensitivity  thyroid or parathyroid disease  an unusual reaction to denosumab, other medicines, foods, dyes, or preservatives  pregnant or trying to get pregnant  breast-feeding How should I use this medicine? This medicine is for injection under the skin. It is given by a health care professional in a hospital or clinic setting. A special MedGuide will be given to you before each treatment. Be sure to read this information carefully each time. For Prolia, talk to your pediatrician regarding the use of this medicine in children. Special care may be needed. For Xgeva, talk to your pediatrician regarding the use of this medicine in children. While this drug may be prescribed for children as young as 13 years for selected conditions, precautions do apply. Overdosage: If you think you have taken too much of this medicine contact a poison control center or emergency room at once. NOTE: This medicine is only for you. Do not share this medicine with others. What if I miss a dose? It is  important not to miss your dose. Call your doctor or health care professional if you are unable to keep an appointment. What may interact with this medicine? Do not take this medicine with any of the following medications:  other medicines containing denosumab This medicine may also interact with the following medications:  medicines that lower your chance of fighting infection  steroid medicines like prednisone or cortisone This list may not describe all possible interactions. Give your health care provider a list of all the medicines, herbs, non-prescription drugs, or dietary supplements you use. Also tell them if you smoke, drink alcohol, or use illegal drugs. Some items may interact with your medicine. What should I watch for while using this medicine? Visit your doctor or health care professional for regular checks on your progress. Your doctor or health care professional may order blood tests and other tests to see how you are doing. Call your doctor or health care professional for advice if you get a fever, chills or sore throat, or other symptoms of a cold or flu. Do not treat yourself. This drug may decrease your body's ability to fight infection. Try to avoid being around people who are sick. You should make sure you get enough calcium and vitamin D while you are taking this medicine, unless your doctor tells you not to. Discuss the foods you eat and the vitamins you take with your health care professional. See your dentist regularly. Brush and floss your teeth as directed. Before you have any dental work done, tell your dentist you are   receiving this medicine. Do not become pregnant while taking this medicine or for 5 months after stopping it. Talk with your doctor or health care professional about your birth control options while taking this medicine. Women should inform their doctor if they wish to become pregnant or think they might be pregnant. There is a potential for serious side  effects to an unborn child. Talk to your health care professional or pharmacist for more information. What side effects may I notice from receiving this medicine? Side effects that you should report to your doctor or health care professional as soon as possible:  allergic reactions like skin rash, itching or hives, swelling of the face, lips, or tongue  bone pain  breathing problems  dizziness  jaw pain, especially after dental work  redness, blistering, peeling of the skin  signs and symptoms of infection like fever or chills; cough; sore throat; pain or trouble passing urine  signs of low calcium like fast heartbeat, muscle cramps or muscle pain; pain, tingling, numbness in the hands or feet; seizures  unusual bleeding or bruising  unusually weak or tired Side effects that usually do not require medical attention (report to your doctor or health care professional if they continue or are bothersome):  constipation  diarrhea  headache  joint pain  loss of appetite  muscle pain  runny nose  tiredness  upset stomach This list may not describe all possible side effects. Call your doctor for medical advice about side effects. You may report side effects to FDA at 1-800-FDA-1088. Where should I keep my medicine? This medicine is only given in a clinic, doctor's office, or other health care setting and will not be stored at home. NOTE: This sheet is a summary. It may not cover all possible information. If you have questions about this medicine, talk to your doctor, pharmacist, or health care provider.  2021 Elsevier/Gold Standard (2017-10-27 16:10:44)

## 2020-10-08 ENCOUNTER — Other Ambulatory Visit (HOSPITAL_COMMUNITY): Payer: Self-pay

## 2020-10-08 MED FILL — Abemaciclib Tab 150 MG: ORAL | 28 days supply | Qty: 28 | Fill #0 | Status: AC

## 2020-10-12 ENCOUNTER — Other Ambulatory Visit (HOSPITAL_COMMUNITY): Payer: Self-pay

## 2020-10-19 ENCOUNTER — Ambulatory Visit (HOSPITAL_COMMUNITY)
Admission: RE | Admit: 2020-10-19 | Discharge: 2020-10-19 | Disposition: A | Discharge status: Home / Self Care | Payer: Medicare Other | Source: Ambulatory Visit | Attending: Hematology & Oncology | Admitting: Hematology & Oncology

## 2020-10-19 ENCOUNTER — Other Ambulatory Visit: Payer: Self-pay

## 2020-10-19 DIAGNOSIS — C785 Secondary malignant neoplasm of large intestine and rectum: Secondary | ICD-10-CM | POA: Insufficient documentation

## 2020-10-19 DIAGNOSIS — C50911 Malignant neoplasm of unspecified site of right female breast: Secondary | ICD-10-CM | POA: Insufficient documentation

## 2020-10-19 DIAGNOSIS — Z79899 Other long term (current) drug therapy: Secondary | ICD-10-CM | POA: Insufficient documentation

## 2020-10-19 DIAGNOSIS — C50912 Malignant neoplasm of unspecified site of left female breast: Secondary | ICD-10-CM | POA: Diagnosis not present

## 2020-10-19 LAB — GLUCOSE, CAPILLARY: Glucose-Capillary: 93 mg/dL (ref 70–99)

## 2020-10-19 MED ORDER — FLUDEOXYGLUCOSE F - 18 (FDG) INJECTION
6.9000 | Freq: Once | INTRAVENOUS | Status: AC
  Administered 2020-10-19: 6.82 via INTRAVENOUS

## 2020-10-20 ENCOUNTER — Telehealth: Payer: Self-pay | Admitting: *Deleted

## 2020-10-20 NOTE — Telephone Encounter (Signed)
Patient notified per order of Dr. Marin Olp that "the PET scan is negative!!  You are in remission!!  [place]"  Pt appreciative of call and has no questions or concerns at this time.

## 2020-10-20 NOTE — Telephone Encounter (Signed)
-----   Message from Volanda Napoleon, MD sent at 10/19/2020  5:00 PM EDT ----- Call - the PET scan is negative!!  You are in remission!!  Laurey Arrow

## 2020-10-27 ENCOUNTER — Encounter: Payer: Self-pay | Admitting: Hematology & Oncology

## 2020-10-27 ENCOUNTER — Other Ambulatory Visit: Payer: Self-pay

## 2020-10-27 ENCOUNTER — Inpatient Hospital Stay: Payer: Medicare Other

## 2020-10-27 ENCOUNTER — Inpatient Hospital Stay (HOSPITAL_BASED_OUTPATIENT_CLINIC_OR_DEPARTMENT_OTHER): Payer: Medicare Other | Admitting: Hematology & Oncology

## 2020-10-27 ENCOUNTER — Telehealth: Payer: Self-pay

## 2020-10-27 VITALS — BP 119/48 | HR 79 | Temp 98.7°F | Resp 18 | Wt 137.1 lb

## 2020-10-27 DIAGNOSIS — C50911 Malignant neoplasm of unspecified site of right female breast: Secondary | ICD-10-CM | POA: Diagnosis not present

## 2020-10-27 DIAGNOSIS — M1712 Unilateral primary osteoarthritis, left knee: Secondary | ICD-10-CM

## 2020-10-27 DIAGNOSIS — C50912 Malignant neoplasm of unspecified site of left female breast: Secondary | ICD-10-CM | POA: Diagnosis not present

## 2020-10-27 DIAGNOSIS — M818 Other osteoporosis without current pathological fracture: Secondary | ICD-10-CM | POA: Diagnosis not present

## 2020-10-27 DIAGNOSIS — C785 Secondary malignant neoplasm of large intestine and rectum: Secondary | ICD-10-CM

## 2020-10-27 LAB — CMP (CANCER CENTER ONLY)
ALT: 13 U/L (ref 0–44)
AST: 18 U/L (ref 15–41)
Albumin: 4.2 g/dL (ref 3.5–5.0)
Alkaline Phosphatase: 87 U/L (ref 38–126)
Anion gap: 6 (ref 5–15)
BUN: 17 mg/dL (ref 8–23)
CO2: 30 mmol/L (ref 22–32)
Calcium: 9.9 mg/dL (ref 8.9–10.3)
Chloride: 101 mmol/L (ref 98–111)
Creatinine: 1.03 mg/dL — ABNORMAL HIGH (ref 0.44–1.00)
GFR, Estimated: 58 mL/min — ABNORMAL LOW (ref >60)
Glucose, Bld: 89 mg/dL (ref 70–99)
Potassium: 4.4 mmol/L (ref 3.5–5.1)
Sodium: 137 mmol/L (ref 135–145)
Total Bilirubin: 0.4 mg/dL (ref 0.3–1.2)
Total Protein: 7.2 g/dL (ref 6.5–8.1)

## 2020-10-27 LAB — CBC WITH DIFFERENTIAL (CANCER CENTER ONLY)
Abs Immature Granulocytes: 0.01 10*3/uL (ref 0.00–0.07)
Basophils Absolute: 0 10*3/uL (ref 0.0–0.1)
Basophils Relative: 1 %
Eosinophils Absolute: 0.1 10*3/uL (ref 0.0–0.5)
Eosinophils Relative: 1 %
HCT: 40.8 % (ref 36.0–46.0)
Hemoglobin: 13.5 g/dL (ref 12.0–15.0)
Immature Granulocytes: 0 %
Lymphocytes Relative: 27 %
Lymphs Abs: 1.3 10*3/uL (ref 0.7–4.0)
MCH: 32.4 pg (ref 26.0–34.0)
MCHC: 33.1 g/dL (ref 30.0–36.0)
MCV: 97.8 fL (ref 80.0–100.0)
Monocytes Absolute: 0.3 10*3/uL (ref 0.1–1.0)
Monocytes Relative: 6 %
Neutro Abs: 3 10*3/uL (ref 1.7–7.7)
Neutrophils Relative %: 65 %
Platelet Count: 123 10*3/uL — ABNORMAL LOW (ref 150–400)
RBC: 4.17 MIL/uL (ref 3.87–5.11)
RDW: 12.8 % (ref 11.5–15.5)
WBC Count: 4.6 10*3/uL (ref 4.0–10.5)
nRBC: 0 % (ref 0.0–0.2)

## 2020-10-27 LAB — LACTATE DEHYDROGENASE: LDH: 102 U/L (ref 98–192)

## 2020-10-27 NOTE — Progress Notes (Signed)
Hematology and Oncology Follow Up Visit  Martha [place] [id number] [birth year] 70 y.o. 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 daily  Ribociclib 600 mg p.o. daily (21/7) -- d/c due to skin rash       Verzenio 150 mg po day - started on 06/12/2019        Prolia 60 mg IM q. 6 months --next dose in October 2022    Interim History:  [place] is back for follow-up.  Things are moving ahead with respect to [place] moving.  They sold [place] house.  They have a contract on the house.  They were closed in June.  The big news is that they found out they will be grandparents.  I know that their son and daughter-in-law had a miscarriage with the last pregnancy.  Hopefully, this pregnancy will go to term.  I know [place] is looking forward to being a grandmother.  We did do a PET scan on [place].  This was done a week or so ago.  PET scan did not show any evidence of active disease.  [place] last CA 27.29 was stable at 24.  [place] does have osteoporosis.  We did start [place] on Prolia.  [place] did well with the Prolia.  [place] has had no problems with [place] bowels or bladder.  [place] has a little nausea.  [place] said the nausea may be a little bit worse after [place] took Prolia.  [place] has had no fever.  There is no cough.  [place] has had no rashes.  [place] has had no leg swelling.   As always, [place] is trying to stay as active as possible.  Currently, [place] performance status is ECOG 1.    Medications:  Current Outpatient Medications:  .  abemaciclib (VERZENIO) 150 MG tablet, TAKE 1 TABLET (150 MG TOTAL) BY MOUTH DAILY., Disp: 28 tablet, Rfl: 6 .  Cholecalciferol (VITAMIN D3 PO), Take 2,000 Units by mouth daily., Disp: , Rfl:  .  denosumab (PROLIA) 60 MG/ML SOSY injection, Inject 60 mg into the skin every 6 (six) months., Disp: , Rfl:  .   letrozole (FEMARA) 2.5 MG tablet, TAKE 1 TABLET BY MOUTH ONCE A DAY, Disp: 90 tablet, Rfl: 6 .  Psyllium (METAMUCIL) 48.57 % POWD, 1 packet with 8 ounces of liquid as needed, Disp: , Rfl:  .  ondansetron (ZOFRAN) 4 MG tablet, Take 1 tablet (4 mg total) by mouth every 8 (eight) hours as needed for nausea or vomiting. (Patient not taking: No sig reported), Disp: 20 tablet, Rfl: 2  Allergies:  Allergies  Allergen Reactions  . Other Other (See Comments)  . Kisqali (200 Mg Dose) [Ribociclib Succ (200 Mg Dose)] Rash and Other (See Comments)    Itchy, scratchy throat.    Past Medical History, Surgical history, Social history, and Family History were reviewed and updated.  Review of Systems: Review of Systems  Constitutional: Negative.   HENT:  Negative.   Eyes: Negative.   Respiratory: Negative.   Cardiovascular: Negative.   Gastrointestinal: Positive for abdominal pain.  Endocrine: Negative.   Genitourinary: Negative.    Musculoskeletal: Negative.   Skin: Negative.   Neurological: Negative.   Hematological: Negative.   Psychiatric/Behavioral: Negative.     Physical Exam:  weight is 137 lb 2 oz (62.2 kg). [place] oral temperature  is 98.7 F (37.1 C). [place] blood pressure is 119/48 (abnormal) and [place] pulse is 79. [place] respiration is 18 and oxygen saturation is 100%.   Wt Readings from Last 3 Encounters:  10/27/20 137 lb 2 oz (62.2 kg)  09/11/20 137 lb (62.1 kg)  06/23/20 137 lb 8 oz (62.4 kg)    Physical Exam Vitals signs reviewed.  Constitutional:      Comments: Breast exam bilaterally shows no breast masses.  [place] has no nipple discharge bilaterally.  [place] has no breast swelling or erythema.  There is no bilateral axillary adenopathy.  HENT:     Head: Normocephalic and atraumatic.  Eyes:     Pupils: Pupils are equal, round, and reactive to light.  Neck:     Musculoskeletal: Normal range of motion.  Cardiovascular:     Rate and Rhythm: Normal rate and regular rhythm.     Heart  sounds: Normal heart sounds.  Pulmonary:     Effort: Pulmonary effort is normal.     Breath sounds: Normal breath sounds.  Abdominal:     General: Bowel sounds are normal.     Palpations: Abdomen is soft.     Comments: Abdominal exam shows the healing laparotomy scar.  [place] has no swelling.  There is no erythema about the surgical site.  [place] has been tenderness to palpation in the right lower quadrant.  No masses noted.  Bowel sounds are present.  There is no palpable liver or spleen tip.  Musculoskeletal: Normal range of motion.        General: No tenderness or deformity.  Lymphadenopathy:     Cervical: No cervical adenopathy.  Skin:    General: Skin is warm and dry.     Findings: No erythema or rash.  Neurological:     Mental Status: [place] is alert and oriented to person, place, and time.  Psychiatric:        Behavior: Behavior normal.        Thought Content: Thought content normal.        Judgment: Judgment normal.      Lab Results  Component Value Date   WBC 4.6 10/27/2020   HGB 13.5 10/27/2020   HCT 40.8 10/27/2020   MCV 97.8 10/27/2020   PLT 123 (L) 10/27/2020     Chemistry      Component Value Date/Time   NA 137 10/27/2020 0952   K 4.4 10/27/2020 0952   CL 101 10/27/2020 0952   CO2 30 10/27/2020 0952   BUN 17 10/27/2020 0952   CREATININE 1.03 (H) 10/27/2020 0952      Component Value Date/Time   CALCIUM 9.9 10/27/2020 0952   ALKPHOS 87 10/27/2020 0952   AST 18 10/27/2020 0952   ALT 13 10/27/2020 0952   BILITOT 0.4 10/27/2020 0952       Impression and Plan: [name] is a 70 year old postmenopausal female.  [place] has metastatic lobular carcinoma of the left breast.  We finally found [place] primary after doing a breast MRI.  [place] had resection of the colonic mass.  [place] had multiple positive lymph nodes.    I do not think we need another PET scan probably until August or September now.  We can try to get [place] through the summer without any scans.  I am just happy  about the potential grandchild.  I know that [place] and [place] husband are looking forward to this.  I know [place] will be quite busy until [place] moves.  We will  plan to get [place] back to see [place] in another 2 months or so.  It would be nice to see [place] back after [place].  [name], MD

## 2020-10-27 NOTE — Telephone Encounter (Signed)
Called pt and she is aware of her f/u appt per 10/27/20 los   [employer]

## 2020-10-28 ENCOUNTER — Other Ambulatory Visit (HOSPITAL_COMMUNITY): Payer: Self-pay

## 2020-10-28 ENCOUNTER — Other Ambulatory Visit: Payer: Medicare Other

## 2020-10-28 ENCOUNTER — Ambulatory Visit: Payer: Medicare Other | Admitting: Hematology & Oncology

## 2020-10-28 LAB — CANCER ANTIGEN 27.29: CA 27.29: 27.4 U/mL (ref 0.0–38.6)

## 2020-11-02 ENCOUNTER — Other Ambulatory Visit (HOSPITAL_COMMUNITY): Payer: Self-pay

## 2020-11-02 MED FILL — Abemaciclib Tab 150 MG: ORAL | 28 days supply | Qty: 28 | Fill #1 | Status: AC

## 2020-11-05 ENCOUNTER — Other Ambulatory Visit (HOSPITAL_COMMUNITY): Payer: Self-pay

## 2020-11-17 DIAGNOSIS — L988 Other specified disorders of the skin and subcutaneous tissue: Secondary | ICD-10-CM | POA: Diagnosis not present

## 2020-11-17 DIAGNOSIS — C44629 Squamous cell carcinoma of skin of left upper limb, including shoulder: Secondary | ICD-10-CM | POA: Diagnosis not present

## 2020-11-24 DIAGNOSIS — K621 Rectal polyp: Secondary | ICD-10-CM | POA: Diagnosis not present

## 2020-11-24 DIAGNOSIS — Z85038 Personal history of other malignant neoplasm of large intestine: Secondary | ICD-10-CM | POA: Diagnosis not present

## 2020-11-24 DIAGNOSIS — Z980 Intestinal bypass and anastomosis status: Secondary | ICD-10-CM | POA: Diagnosis not present

## 2020-11-24 DIAGNOSIS — K635 Polyp of colon: Secondary | ICD-10-CM | POA: Diagnosis not present

## 2020-11-24 DIAGNOSIS — Z9889 Other specified postprocedural states: Secondary | ICD-10-CM

## 2020-11-24 HISTORY — DX: Other specified postprocedural states: Z98.890

## 2020-11-25 ENCOUNTER — Encounter: Payer: Self-pay | Admitting: Hematology & Oncology

## 2020-11-26 ENCOUNTER — Other Ambulatory Visit: Payer: Self-pay

## 2020-11-26 ENCOUNTER — Other Ambulatory Visit (HOSPITAL_COMMUNITY): Payer: Self-pay

## 2020-11-26 DIAGNOSIS — C50911 Malignant neoplasm of unspecified site of right female breast: Secondary | ICD-10-CM

## 2020-11-26 DIAGNOSIS — C785 Secondary malignant neoplasm of large intestine and rectum: Secondary | ICD-10-CM

## 2020-11-26 MED ORDER — ABEMACICLIB 150 MG PO TABS
ORAL_TABLET | Freq: Every day | ORAL | 6 refills | Status: DC
  Filled 2020-11-26: qty 28, fill #0
  Filled 2020-11-27: qty 28, 28d supply, fill #0
  Filled 2020-12-24: qty 28, 28d supply, fill #1
  Filled 2021-01-26: qty 28, 28d supply, fill #2
  Filled 2021-02-25: qty 28, 28d supply, fill #3
  Filled 2021-03-26: qty 28, 28d supply, fill #4
  Filled 2021-04-23: qty 28, 28d supply, fill #5
  Filled 2021-05-20: qty 28, 28d supply, fill #6

## 2020-11-27 ENCOUNTER — Other Ambulatory Visit (HOSPITAL_COMMUNITY): Payer: Self-pay

## 2020-11-27 DIAGNOSIS — Z85038 Personal history of other malignant neoplasm of large intestine: Secondary | ICD-10-CM | POA: Diagnosis not present

## 2020-11-27 DIAGNOSIS — K635 Polyp of colon: Secondary | ICD-10-CM | POA: Diagnosis not present

## 2020-12-03 ENCOUNTER — Other Ambulatory Visit (HOSPITAL_COMMUNITY): Payer: Self-pay

## 2020-12-07 ENCOUNTER — Other Ambulatory Visit (HOSPITAL_COMMUNITY): Payer: Self-pay

## 2020-12-07 MED FILL — Letrozole Tab 2.5 MG: ORAL | 90 days supply | Qty: 90 | Fill #0 | Status: AC

## 2020-12-22 ENCOUNTER — Inpatient Hospital Stay: Payer: Medicare Other | Attending: Hematology & Oncology

## 2020-12-22 ENCOUNTER — Inpatient Hospital Stay (HOSPITAL_BASED_OUTPATIENT_CLINIC_OR_DEPARTMENT_OTHER): Payer: Medicare Other | Admitting: Hematology & Oncology

## 2020-12-22 ENCOUNTER — Telehealth: Payer: Self-pay

## 2020-12-22 ENCOUNTER — Encounter: Payer: Self-pay | Admitting: Hematology & Oncology

## 2020-12-22 ENCOUNTER — Other Ambulatory Visit: Payer: Self-pay

## 2020-12-22 VITALS — BP 114/43 | HR 71 | Temp 98.2°F | Resp 18 | Wt 136.8 lb

## 2020-12-22 DIAGNOSIS — C50912 Malignant neoplasm of unspecified site of left female breast: Secondary | ICD-10-CM | POA: Insufficient documentation

## 2020-12-22 DIAGNOSIS — M818 Other osteoporosis without current pathological fracture: Secondary | ICD-10-CM

## 2020-12-22 DIAGNOSIS — C50911 Malignant neoplasm of unspecified site of right female breast: Secondary | ICD-10-CM | POA: Diagnosis not present

## 2020-12-22 DIAGNOSIS — Z170 Estrogen receptor positive status [ER+]: Secondary | ICD-10-CM | POA: Diagnosis not present

## 2020-12-22 DIAGNOSIS — Z79811 Long term (current) use of aromatase inhibitors: Secondary | ICD-10-CM | POA: Insufficient documentation

## 2020-12-22 DIAGNOSIS — Z79899 Other long term (current) drug therapy: Secondary | ICD-10-CM | POA: Diagnosis not present

## 2020-12-22 DIAGNOSIS — Z9049 Acquired absence of other specified parts of digestive tract: Secondary | ICD-10-CM | POA: Insufficient documentation

## 2020-12-22 DIAGNOSIS — C785 Secondary malignant neoplasm of large intestine and rectum: Secondary | ICD-10-CM | POA: Diagnosis not present

## 2020-12-22 DIAGNOSIS — M1712 Unilateral primary osteoarthritis, left knee: Secondary | ICD-10-CM

## 2020-12-22 LAB — CMP (CANCER CENTER ONLY)
ALT: 15 U/L (ref 0–44)
AST: 20 U/L (ref 15–41)
Albumin: 4.2 g/dL (ref 3.5–5.0)
Alkaline Phosphatase: 51 U/L (ref 38–126)
Anion gap: 7 (ref 5–15)
BUN: 18 mg/dL (ref 8–23)
CO2: 29 mmol/L (ref 22–32)
Calcium: 9.9 mg/dL (ref 8.9–10.3)
Chloride: 102 mmol/L (ref 98–111)
Creatinine: 1.06 mg/dL — ABNORMAL HIGH (ref 0.44–1.00)
GFR, Estimated: 57 mL/min — ABNORMAL LOW (ref >60)
Glucose, Bld: 100 mg/dL — ABNORMAL HIGH (ref 70–99)
Potassium: 4.7 mmol/L (ref 3.5–5.1)
Sodium: 138 mmol/L (ref 135–145)
Total Bilirubin: 0.3 mg/dL (ref 0.3–1.2)
Total Protein: 6.8 g/dL (ref 6.5–8.1)

## 2020-12-22 LAB — CBC WITH DIFFERENTIAL (CANCER CENTER ONLY)
Abs Immature Granulocytes: 0.02 10*3/uL (ref 0.00–0.07)
Basophils Absolute: 0.1 10*3/uL (ref 0.0–0.1)
Basophils Relative: 1 %
Eosinophils Absolute: 0 10*3/uL (ref 0.0–0.5)
Eosinophils Relative: 1 %
HCT: 37.6 % (ref 36.0–46.0)
Hemoglobin: 12.6 g/dL (ref 12.0–15.0)
Immature Granulocytes: 1 %
Lymphocytes Relative: 31 %
Lymphs Abs: 1.4 10*3/uL (ref 0.7–4.0)
MCH: 33 pg (ref 26.0–34.0)
MCHC: 33.5 g/dL (ref 30.0–36.0)
MCV: 98.4 fL (ref 80.0–100.0)
Monocytes Absolute: 0.3 10*3/uL (ref 0.1–1.0)
Monocytes Relative: 6 %
Neutro Abs: 2.7 10*3/uL (ref 1.7–7.7)
Neutrophils Relative %: 60 %
Platelet Count: 114 10*3/uL — ABNORMAL LOW (ref 150–400)
RBC: 3.82 MIL/uL — ABNORMAL LOW (ref 3.87–5.11)
RDW: 12.6 % (ref 11.5–15.5)
WBC Count: 4.4 10*3/uL (ref 4.0–10.5)
nRBC: 0 % (ref 0.0–0.2)

## 2020-12-22 LAB — VITAMIN D 25 HYDROXY (VIT D DEFICIENCY, FRACTURES): Vit D, 25-Hydroxy: 42.81 ng/mL (ref 30–100)

## 2020-12-22 NOTE — Telephone Encounter (Signed)
Appts made per 12/22/20 los and pt req to view on mychart  [place] 

## 2020-12-22 NOTE — Progress Notes (Signed)
Hematology and Oncology Follow Up Visit  Martha Osborne [id number] August 11, 1950 70 y.o. 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 daily Ribociclib 600 mg p.o. daily (21/7) -- d/c due to skin rash       Verzenio 150 mg po day - started on 06/12/2019        Prolia 60 mg IM q. 6 months --next dose in October 2022    Interim History:  Ms. Heatwole is back for follow-up.  -Has been quite busy for today.  She is going have to take her husband to orthopedic surgery.  He tore his bicep tendon while moving a TV.  It sounds like he will be in some type of cast or some kind of brace for a while.  The real bad news is that their son and daughter-in-law had another miscarriage.  They are in [place], [place] when this happened.  I just really feel bad for her.  They now have moved.  There are in their new place.  She had a colonoscopy back in May.  Everything looked fine on the colonoscopy without any evidence of residual/recurrent disease.  Her last CA 27.29 back in April was 27.4.  Is been no change in bowel or bladder habits.  She has had no problems with nausea or vomiting.  She has had no fever.  There is been no bleeding.  She had no rashes.  She has had no cough or shortness of breath.  She is on Verzenio.  She is doing well with the Verzenio.  Is been no real side effects.  Overall, her performance status is ECOG 1.   .    Medications:  Current Outpatient Medications:    abemaciclib (VERZENIO) 150 MG tablet, TAKE 1 TABLET (150 MG TOTAL) BY MOUTH DAILY., Disp: 28 tablet, Rfl: 6   Cholecalciferol (VITAMIN D3 PO), Take 2,000 Units by mouth daily., Disp: , Rfl:    denosumab (PROLIA) 60 MG/ML SOSY injection, Inject 60 mg into the skin every 6 (six) months., Disp: , Rfl:    letrozole (FEMARA) 2.5 MG tablet, TAKE 1  TABLET BY MOUTH ONCE A DAY, Disp: 90 tablet, Rfl: 6   Psyllium (METAMUCIL) 48.57 % POWD, 1 packet with 8 ounces of liquid as needed, Disp: , Rfl:    ondansetron (ZOFRAN) 4 MG tablet, Take 1 tablet (4 mg total) by mouth every 8 (eight) hours as needed for nausea or vomiting. (Patient not taking: No sig reported), Disp: 20 tablet, Rfl: 2  Allergies:  Allergies  Allergen Reactions   Other Other (See Comments)   Kisqali (200 Mg Dose) [Ribociclib Succ (200 Mg Dose)] Rash and Other (See Comments)    Itchy, scratchy throat.    Past Medical History, Surgical history, Social history, and Family History were reviewed and updated.  Review of Systems: Review of Systems  Constitutional: Negative.   HENT:  Negative.   Eyes: Negative.   Respiratory: Negative.   Cardiovascular: Negative.   Gastrointestinal: Positive for abdominal pain.  Endocrine: Negative.   Genitourinary: Negative.    Musculoskeletal: Negative.   Skin: Negative.   Neurological: Negative.   Hematological: Negative.   Psychiatric/Behavioral: Negative.     Physical Exam:  weight is 136 lb 12 oz (62 kg). Her oral temperature is 98.2 F (36.8 C). Her blood pressure is 114/43 (abnormal) and her pulse  is 71. Her respiration is 18 and oxygen saturation is 99%.   Wt Readings from Last 3 Encounters:  12/22/20 136 lb 12 oz (62 kg)  10/27/20 137 lb 2 oz (62.2 kg)  09/11/20 137 lb (62.1 kg)    Physical Exam Vitals signs reviewed.  Constitutional:      Comments: Breast exam bilaterally shows no breast masses.  She has no nipple discharge bilaterally.  She has no breast swelling or erythema.  There is no bilateral axillary adenopathy.  HENT:     Head: Normocephalic and atraumatic.  Eyes:     Pupils: Pupils are equal, round, and reactive to light.  Neck:     Musculoskeletal: Normal range of motion.  Cardiovascular:     Rate and Rhythm: Normal rate and regular rhythm.     Heart sounds: Normal heart sounds.  Pulmonary:      Effort: Pulmonary effort is normal.     Breath sounds: Normal breath sounds.  Abdominal:     General: Bowel sounds are normal.     Palpations: Abdomen is soft.     Comments: Abdominal exam shows the healing laparotomy scar.  She has no swelling.  There is no erythema about the surgical site.  She has been tenderness to palpation in the right lower quadrant.  No masses noted.  Bowel sounds are present.  There is no palpable liver or spleen tip.  Musculoskeletal: Normal range of motion.        General: No tenderness or deformity.  Lymphadenopathy:     Cervical: No cervical adenopathy.  Skin:    General: Skin is warm and dry.     Findings: No erythema or rash.  Neurological:     Mental Status: She is alert and oriented to person, place, and time.  Psychiatric:        Behavior: Behavior normal.        Thought Content: Thought content normal.        Judgment: Judgment normal.      Lab Results  Component Value Date   WBC 4.4 12/22/2020   HGB 12.6 12/22/2020   HCT 37.6 12/22/2020   MCV 98.4 12/22/2020   PLT 114 (L) 12/22/2020     Chemistry      Component Value Date/Time   NA 138 12/22/2020 1003   K 4.7 12/22/2020 1003   CL 102 12/22/2020 1003   CO2 29 12/22/2020 1003   BUN 18 12/22/2020 1003   CREATININE 1.06 (H) 12/22/2020 1003      Component Value Date/Time   CALCIUM 9.9 12/22/2020 1003   ALKPHOS 51 12/22/2020 1003   AST 20 12/22/2020 1003   ALT 15 12/22/2020 1003   BILITOT 0.3 12/22/2020 1003       Impression and Plan: Ms. Kearley is a 70 year old postmenopausal female.  She has metastatic lobular carcinoma of the left breast.  We finally found her primary after doing a breast MRI.  She had resection of the colonic mass.  She had multiple positive lymph nodes.    For right now, we will plan to get her back in 3 months.  I do not see that we have to do a scan on her.  We will have to monitor the CA 27.29 closely.  I really do hope that her son and daughter-in-law  will be able to have a child and be able to carry the child to term.  This is the second miscarriage.  I hope her husband does okay.  I  know that he is in great hands with Dr. Amedeo Plenty.   [name], MD

## 2020-12-23 LAB — CANCER ANTIGEN 27.29: CA 27.29: 26.6 U/mL (ref 0.0–38.6)

## 2020-12-24 ENCOUNTER — Other Ambulatory Visit (HOSPITAL_COMMUNITY): Payer: Self-pay

## 2021-01-01 DIAGNOSIS — A6920 Lyme disease, unspecified: Secondary | ICD-10-CM | POA: Diagnosis not present

## 2021-01-01 DIAGNOSIS — S30860A Insect bite (nonvenomous) of lower back and pelvis, initial encounter: Secondary | ICD-10-CM | POA: Diagnosis not present

## 2021-01-01 DIAGNOSIS — W57XXXA Bitten or stung by nonvenomous insect and other nonvenomous arthropods, initial encounter: Secondary | ICD-10-CM | POA: Diagnosis not present

## 2021-01-05 ENCOUNTER — Other Ambulatory Visit (HOSPITAL_COMMUNITY): Payer: Self-pay

## 2021-01-07 ENCOUNTER — Encounter: Payer: Self-pay | Admitting: Hematology & Oncology

## 2021-01-23 DIAGNOSIS — Z23 Encounter for immunization: Secondary | ICD-10-CM | POA: Diagnosis not present

## 2021-01-26 ENCOUNTER — Other Ambulatory Visit (HOSPITAL_COMMUNITY): Payer: Self-pay

## 2021-02-01 ENCOUNTER — Other Ambulatory Visit (HOSPITAL_COMMUNITY): Payer: Self-pay

## 2021-02-08 ENCOUNTER — Ambulatory Visit: Payer: Medicare Other | Admitting: Nurse Practitioner

## 2021-02-12 ENCOUNTER — Other Ambulatory Visit (HOSPITAL_COMMUNITY): Payer: Self-pay | Admitting: General Surgery

## 2021-02-12 DIAGNOSIS — Z853 Personal history of malignant neoplasm of breast: Secondary | ICD-10-CM

## 2021-02-19 ENCOUNTER — Other Ambulatory Visit: Payer: Self-pay

## 2021-02-19 ENCOUNTER — Ambulatory Visit (INDEPENDENT_AMBULATORY_CARE_PROVIDER_SITE_OTHER): Payer: Medicare Other | Admitting: Nurse Practitioner

## 2021-02-19 ENCOUNTER — Encounter: Payer: Self-pay | Admitting: Nurse Practitioner

## 2021-02-19 VITALS — BP 122/76 | HR 74 | Temp 97.6°F | Ht 66.08 in | Wt 141.0 lb

## 2021-02-19 DIAGNOSIS — I700 Atherosclerosis of aorta: Secondary | ICD-10-CM | POA: Diagnosis not present

## 2021-02-19 DIAGNOSIS — C785 Secondary malignant neoplasm of large intestine and rectum: Secondary | ICD-10-CM

## 2021-02-19 DIAGNOSIS — C50911 Malignant neoplasm of unspecified site of right female breast: Secondary | ICD-10-CM

## 2021-02-19 DIAGNOSIS — K521 Toxic gastroenteritis and colitis: Secondary | ICD-10-CM

## 2021-02-19 DIAGNOSIS — M810 Age-related osteoporosis without current pathological fracture: Secondary | ICD-10-CM | POA: Diagnosis not present

## 2021-02-19 NOTE — Patient Instructions (Signed)
Please sign record release for last 3 years.   Schedule AWV (telephone) - schedule in ~6 weeks.

## 2021-02-19 NOTE — Progress Notes (Signed)
Careteam: Patient Care Team: Lauree Chandler, NP as PCP - General (Geriatric Medicine) Volanda Napoleon, MD as Medical Oncologist (Oncology) Stark Klein, MD as Consulting Physician (General Surgery) Jari Pigg, MD as Consulting Physician (Dermatology) Dian Queen, MD as Consulting Physician (Obstetrics and Gynecology)  PLACE OF SERVICE:  [place] Directive information Does Patient Have a Medical Advance Directive?: Yes, Type of Advance Directive: [place], Does patient want to make changes to medical advance directive?: No - Patient declined  Allergies  Allergen Reactions   Kisqali (200 Mg Dose) [Ribociclib Succ (200 Mg Dose)] Rash and Other (See Comments)    Itchy, scratchy throat.    Chief Complaint  Patient presents with   Establish Care    New patient establish care. Moved here from [facility] Discuss new osteoporosis diagnosis, patient receives prolia injection through cancer center, Dr.Ennever.      HPI: Patient is a 70 y.o. female to establish care.  Looking for geriatric care. Pt with hx of breast cancer with mets to colon, tinnitis to left ear and osteoporosis.   Breast cancer with mets to the colon- s/p partial colectomy in 2020. Currently taking verzenio and letrozole 2.5 mg daily. Followed by Dr Marin Olp at the cancer center.  Found tumor in breast via MRI, placed her on oral chemo. No radiation or chemo infusion. Has PET scan however no activity noted in breast. Recent MRI did not show any lesion in breast.   Osteoporosis- continues on prolia every 6 months that she gets through the cancer center. Also taking vit D 2,000 units daily   Dry eyes- on drops PRN.   Diarrhea- side effects from cancer medication, on imodium and fiber which controls.   Plans to either move to [place] or moved into [place].   She does not like to call or go to the doctors for much.  Very sensitive to medication- does not like to take  medication.  A lot of turnover at her previous PCP.  Review of Systems:  Review of Systems  Constitutional:  Positive for malaise/fatigue. Negative for chills, fever and weight loss.  HENT:  Negative for tinnitus.   Eyes:        Dry eyes  Respiratory:  Negative for cough, sputum production and shortness of breath.   Cardiovascular:  Negative for chest pain, palpitations and leg swelling.  Gastrointestinal:  Positive for nausea. Negative for abdominal pain, constipation, diarrhea and heartburn.  Genitourinary:  Positive for frequency and urgency. Negative for dysuria.  Musculoskeletal:  Positive for myalgias. Negative for back pain, falls and joint pain.  Skin: Negative.   Neurological:  Negative for dizziness and headaches.  Psychiatric/Behavioral:  Negative for depression and memory loss. The patient does not have insomnia.    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, right ([place]) [id number]   Complication of anesthesia    very sensitive to meds   Goals of care, counseling/discussion 08/30/2018   History of bone density study 09/29/2020   History of colonoscopy 11/24/2020   History of EKG 10/10/2018   History of mammogram 01/17/2020   History of MRI    Breast (03/12/2020), Brain (06/13/2019) and Knee (02/23/2018& 05/22/2018)   Osteoporosis    Personal history of chemotherapy    Skin cancer    basal cell x 2 (on back and on nose)   Tinnitus of left ear    Past Surgical History:  Procedure Laterality Date  bone cyst removed Right 1978   BREAST BIOPSY Left 08/28/2018   COLON RESECTION Right 09/14/2018   Procedure: LAPAROSCOPIC  ASSISTED RIGHT COLECTOMY;  Surgeon: Fanny Skates, MD;  Location: [facility];  Service: General;  Laterality: Right;   COLONOSCOPY     LAPAROSCOPIC RIGHT COLECTOMY  09/14/2018   Social History:   reports that she has never smoked. She has never used smokeless tobacco. She reports current alcohol use. She reports  that she does not use drugs.  Family History  Problem Relation Age of Onset   Dementia Mother    Congestive Heart Failure Father    Dementia Maternal Grandmother    Breast cancer Neg Hx     Medications: Patient's Medications  New Prescriptions   No medications on file  Previous Medications   ABEMACICLIB (VERZENIO) 150 MG TABLET    TAKE 1 TABLET (150 MG TOTAL) BY MOUTH DAILY.   CARBOXYMETH-GLYCERIN-POLYSORB (REFRESH OPTIVE ADVANCED) 0.5-1-0.5 % SOLN    Apply 1 drop to eye daily as needed (Both eyes).   CHOLECALCIFEROL (VITAMIN D3 PO)    Take 2,000 Units by mouth daily.   DENOSUMAB (PROLIA) 60 MG/ML SOSY INJECTION    Inject 60 mg into the skin every 6 (six) months.   LETROZOLE (FEMARA) 2.5 MG TABLET    TAKE 1 TABLET BY MOUTH ONCE A DAY   LOPERAMIDE (IMODIUM) 2 MG CAPSULE    Take 2 mg by mouth as needed.   MISC. DEVICES (ELON PROFESSIONAL NAIL CARE) MISC    1 application by Does not apply route at bedtime.   PSYLLIUM (METAMUCIL) 48.57 % POWD    1 packet with 8 ounces of liquid as needed  Modified Medications   No medications on file  Discontinued Medications   No medications on file    Physical Exam:  Vitals:   02/19/21 1322  BP: 122/76  Pulse: 74  Temp: 97.6 F (36.4 C)  TempSrc: Temporal  SpO2: 99%  Weight: 141 lb (64 kg)  Height: 5' 6.08" (1.678 m)   Body mass index is 22.71 kg/m. Wt Readings from Last 3 Encounters:  02/19/21 141 lb (64 kg)  12/22/20 136 lb 12 oz (62 kg)  10/27/20 137 lb 2 oz (62.2 kg)    Physical Exam Constitutional:      General: She is not in acute distress.    Appearance: She is well-developed. She is not diaphoretic.  HENT:     Head: Normocephalic and atraumatic.     Mouth/Throat:     Pharynx: No oropharyngeal exudate.  Eyes:     Conjunctiva/sclera: Conjunctivae normal.     Pupils: Pupils are equal, round, and reactive to light.  Cardiovascular:     Rate and Rhythm: Normal rate and regular rhythm.     Heart sounds: Normal heart  sounds.  Pulmonary:     Effort: Pulmonary effort is normal.     Breath sounds: Normal breath sounds.  Abdominal:     General: Bowel sounds are normal.     Palpations: Abdomen is soft.  Musculoskeletal:     Cervical back: Normal range of motion and neck supple.     Right lower leg: No edema.     Left lower leg: No edema.  Skin:    General: Skin is warm and dry.  Neurological:     Mental Status: She is alert and oriented to person, place, and time.  Psychiatric:        Mood and Affect: Mood normal.    Labs  reviewed: Basic Metabolic Panel: Recent Labs    09/11/20 0934 10/27/20 0952 12/22/20 1003  NA 138 137 138  K 4.9 4.4 4.7  CL 102 101 102  CO2 '31 30 29  '$ GLUCOSE 82 89 100*  BUN '17 17 18  '$ CREATININE 1.07* 1.03* 1.06*  CALCIUM 9.8 9.9 9.9   Liver Function Tests: Recent Labs    09/11/20 0934 10/27/20 0952 12/22/20 1003  AST '20 18 20  '$ ALT '14 13 15  '$ ALKPHOS 90 87 51  BILITOT 0.3 0.4 0.3  PROT 6.6 7.2 6.8  ALBUMIN 4.1 4.2 4.2   No results for input(s): LIPASE, AMYLASE in the last 8760 hours. No results for input(s): AMMONIA in the last 8760 hours. CBC: Recent Labs    09/11/20 0934 10/27/20 0952 12/22/20 1003  WBC 4.5 4.6 4.4  NEUTROABS 2.6 3.0 2.7  HGB 13.0 13.5 12.6  HCT 38.9 40.8 37.6  MCV 97.7 97.8 98.4  PLT 115* 123* 114*   Lipid Panel: No results for input(s): CHOL, HDL, LDLCALC, TRIG, CHOLHDL, LDLDIRECT in the last 8760 hours. TSH: No results for input(s): TSH in the last 8760 hours. A1C: Lab Results  Component Value Date   HGBA1C 5.4 09/03/2018     Assessment/Plan 1. Breast cancer metastasized to large intestine, right ([facility]) -s/p partial colectomy. Followed by oncology, continue on oral chemo.  2. Osteoporosis without current pathological fracture, unspecified osteoporosis type -continues on prolia through oncology with vit d supplements. Discussed bone density with pt today. Continues with weight bearing exercises.  3. 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   4. Aortic atherosclerosis ([place]) -noted on imaging, will discuss more at next follow up  Next appt: 4 months. Carlos American. [facility], [facility] [phone]

## 2021-02-21 ENCOUNTER — Encounter: Payer: Self-pay | Admitting: *Deleted

## 2021-02-25 ENCOUNTER — Other Ambulatory Visit (HOSPITAL_COMMUNITY): Payer: Self-pay

## 2021-03-02 ENCOUNTER — Other Ambulatory Visit (HOSPITAL_COMMUNITY): Payer: Self-pay

## 2021-03-04 ENCOUNTER — Ambulatory Visit (HOSPITAL_COMMUNITY)
Admission: RE | Admit: 2021-03-04 | Discharge: 2021-03-04 | Disposition: A | Discharge status: Home / Self Care | Payer: Medicare Other | Source: Ambulatory Visit | Attending: General Surgery | Admitting: General Surgery

## 2021-03-04 ENCOUNTER — Other Ambulatory Visit: Payer: Self-pay

## 2021-03-04 DIAGNOSIS — Z853 Personal history of malignant neoplasm of breast: Secondary | ICD-10-CM | POA: Insufficient documentation

## 2021-03-04 DIAGNOSIS — C7981 Secondary malignant neoplasm of breast: Secondary | ICD-10-CM | POA: Diagnosis not present

## 2021-03-04 MED ORDER — GADOBUTROL 1 MMOL/ML IV SOLN
6.0000 mL | Freq: Once | INTRAVENOUS | Status: AC | PRN
  Administered 2021-03-04: 6 mL via INTRAVENOUS

## 2021-03-16 ENCOUNTER — Other Ambulatory Visit (HOSPITAL_COMMUNITY): Payer: Self-pay

## 2021-03-16 MED FILL — Letrozole Tab 2.5 MG: ORAL | 90 days supply | Qty: 90 | Fill #1 | Status: AC

## 2021-03-18 DIAGNOSIS — L578 Other skin changes due to chronic exposure to nonionizing radiation: Secondary | ICD-10-CM | POA: Diagnosis not present

## 2021-03-18 DIAGNOSIS — L570 Actinic keratosis: Secondary | ICD-10-CM | POA: Diagnosis not present

## 2021-03-18 DIAGNOSIS — D225 Melanocytic nevi of trunk: Secondary | ICD-10-CM | POA: Diagnosis not present

## 2021-03-18 DIAGNOSIS — D2372 Other benign neoplasm of skin of left lower limb, including hip: Secondary | ICD-10-CM | POA: Diagnosis not present

## 2021-03-18 DIAGNOSIS — Z85828 Personal history of other malignant neoplasm of skin: Secondary | ICD-10-CM | POA: Diagnosis not present

## 2021-03-23 ENCOUNTER — Other Ambulatory Visit: Payer: Self-pay

## 2021-03-23 ENCOUNTER — Inpatient Hospital Stay: Payer: Medicare Other | Attending: Hematology & Oncology

## 2021-03-23 ENCOUNTER — Telehealth: Payer: Self-pay

## 2021-03-23 ENCOUNTER — Encounter: Payer: Self-pay | Admitting: Hematology & Oncology

## 2021-03-23 ENCOUNTER — Inpatient Hospital Stay (HOSPITAL_BASED_OUTPATIENT_CLINIC_OR_DEPARTMENT_OTHER): Payer: Medicare Other | Admitting: Hematology & Oncology

## 2021-03-23 VITALS — BP 138/49 | HR 70 | Temp 98.2°F | Resp 18 | Wt 142.0 lb

## 2021-03-23 DIAGNOSIS — Z79811 Long term (current) use of aromatase inhibitors: Secondary | ICD-10-CM | POA: Insufficient documentation

## 2021-03-23 DIAGNOSIS — Z780 Asymptomatic menopausal state: Secondary | ICD-10-CM | POA: Insufficient documentation

## 2021-03-23 DIAGNOSIS — Z170 Estrogen receptor positive status [ER+]: Secondary | ICD-10-CM | POA: Insufficient documentation

## 2021-03-23 DIAGNOSIS — Z9049 Acquired absence of other specified parts of digestive tract: Secondary | ICD-10-CM | POA: Insufficient documentation

## 2021-03-23 DIAGNOSIS — C50911 Malignant neoplasm of unspecified site of right female breast: Secondary | ICD-10-CM | POA: Diagnosis not present

## 2021-03-23 DIAGNOSIS — C785 Secondary malignant neoplasm of large intestine and rectum: Secondary | ICD-10-CM | POA: Diagnosis not present

## 2021-03-23 DIAGNOSIS — Z79899 Other long term (current) drug therapy: Secondary | ICD-10-CM | POA: Insufficient documentation

## 2021-03-23 DIAGNOSIS — C50912 Malignant neoplasm of unspecified site of left female breast: Secondary | ICD-10-CM | POA: Insufficient documentation

## 2021-03-23 LAB — CBC WITH DIFFERENTIAL (CANCER CENTER ONLY)
Abs Immature Granulocytes: 0.01 K/uL (ref 0.00–0.07)
Basophils Absolute: 0.1 K/uL (ref 0.0–0.1)
Basophils Relative: 1 %
Eosinophils Absolute: 0 K/uL (ref 0.0–0.5)
Eosinophils Relative: 1 %
HCT: 38.4 % (ref 36.0–46.0)
Hemoglobin: 12.9 g/dL (ref 12.0–15.0)
Immature Granulocytes: 0 %
Lymphocytes Relative: 30 %
Lymphs Abs: 1.6 K/uL (ref 0.7–4.0)
MCH: 33.2 pg (ref 26.0–34.0)
MCHC: 33.6 g/dL (ref 30.0–36.0)
MCV: 98.7 fL (ref 80.0–100.0)
Monocytes Absolute: 0.3 K/uL (ref 0.1–1.0)
Monocytes Relative: 6 %
Neutro Abs: 3.3 K/uL (ref 1.7–7.7)
Neutrophils Relative %: 62 %
Platelet Count: 120 K/uL — ABNORMAL LOW (ref 150–400)
RBC: 3.89 MIL/uL (ref 3.87–5.11)
RDW: 12.8 % (ref 11.5–15.5)
WBC Count: 5.3 K/uL (ref 4.0–10.5)
nRBC: 0 % (ref 0.0–0.2)

## 2021-03-23 LAB — CMP (CANCER CENTER ONLY)
ALT: 13 U/L (ref 0–44)
AST: 21 U/L (ref 15–41)
Albumin: 4.3 g/dL (ref 3.5–5.0)
Alkaline Phosphatase: 58 U/L (ref 38–126)
Anion gap: 9 (ref 5–15)
BUN: 19 mg/dL (ref 8–23)
CO2: 28 mmol/L (ref 22–32)
Calcium: 9.7 mg/dL (ref 8.9–10.3)
Chloride: 100 mmol/L (ref 98–111)
Creatinine: 1.04 mg/dL — ABNORMAL HIGH (ref 0.44–1.00)
GFR, Estimated: 58 mL/min — ABNORMAL LOW
Glucose, Bld: 92 mg/dL (ref 70–99)
Potassium: 4.4 mmol/L (ref 3.5–5.1)
Sodium: 137 mmol/L (ref 135–145)
Total Bilirubin: 0.4 mg/dL (ref 0.3–1.2)
Total Protein: 7.3 g/dL (ref 6.5–8.1)

## 2021-03-23 LAB — LACTATE DEHYDROGENASE: LDH: 99 U/L (ref 98–192)

## 2021-03-23 NOTE — Telephone Encounter (Signed)
Appts made and printed for pt per 03/23/21 los  Martha Osborne 

## 2021-03-23 NOTE — Progress Notes (Signed)
Hematology and Oncology Follow Up Visit  TERSA FOTOPOULOS [id number] 12/07/1950 70 y.o. 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 daily Ribociclib 600 mg p.o. daily (21/7) -- d/c due to skin rash       Verzenio 150 mg po day - started on 06/12/2019        Prolia 60 mg IM q. 6 months --next dose in October 2022    Interim History:  Ms. Blackard is back for follow-up.  She had a very good summer.  She is enjoying where she is living right now.  Her husband is doing okay.  He did tear a bicep tendon last time that we saw her.  This is been fixed..  She and her husband are planning on doing several trips.  They will be going to the mountains soon.  They will be going down to [place].  Their son lives down there.  They will be planning on being in [place] for a month early next year.  Her last CA 27.29 was holding steady at 27.  She did have a recent MRI of the left breast.  This was done on 03/04/2021.  This did not show any evidence of local recurrence.  We will set up a PET scan to be done in October.  .  To make sure that she gets her Prolia next month.  There is no change in bowel or bladder habits.  On occasion, she does have episodes of diarrhea and nausea.  She is wondering if she might be able to come off the letrozole.  I think we might be able to see about getting her off letrozole if everything stable on the PET scan.  Currently, her performance status is ECOG 1.  .    Medications:  Current Outpatient Medications:    abemaciclib (VERZENIO) 150 MG tablet, TAKE 1 TABLET (150 MG TOTAL) BY MOUTH DAILY., Disp: 28 tablet, Rfl: 6   Carboxymeth-Glycerin-Polysorb (REFRESH OPTIVE ADVANCED) 0.5-1-0.5 % SOLN, Apply 1 drop to eye daily as needed (Both eyes)., Disp: , Rfl:    Cholecalciferol (VITAMIN D3  PO), Take 2,000 Units by mouth daily., Disp: , Rfl:    denosumab (PROLIA) 60 MG/ML SOSY injection, Inject 60 mg into the skin every 6 (six) months., Disp: , Rfl:    letrozole (FEMARA) 2.5 MG tablet, TAKE 1 TABLET BY MOUTH ONCE A DAY, Disp: 90 tablet, Rfl: 6   loperamide (IMODIUM) 2 MG capsule, Take 2 mg by mouth as needed., Disp: , Rfl:    Misc. Devices (ELON PROFESSIONAL NAIL CARE) MISC, 1 application by Does not apply route at bedtime., Disp: , Rfl:    Psyllium (METAMUCIL) 48.57 % POWD, 1 packet with 8 ounces of liquid as needed, Disp: , Rfl:   Allergies:  Allergies  Allergen Reactions   Kisqali (200 Mg Dose) [Ribociclib Succ (200 Mg Dose)] Rash and Other (See Comments)    Itchy, scratchy throat.    Past Medical History, Surgical history, Social history, and Family History were reviewed and updated.  Review of Systems: Review of Systems  Constitutional: Negative.   HENT:  Negative.   Eyes: Negative.   Respiratory: Negative.   Cardiovascular: Negative.   Gastrointestinal: Positive for abdominal pain.  Endocrine: Negative.   Genitourinary: Negative.    Musculoskeletal: Negative.   Skin: Negative.   Neurological: Negative.  Hematological: Negative.   Psychiatric/Behavioral: Negative.     Physical Exam:  weight is 142 lb (64.4 kg). Her oral temperature is 98.2 F (36.8 C). Her blood pressure is 138/49 (abnormal) and her pulse is 70. Her respiration is 18 and oxygen saturation is 99%.   Wt Readings from Last 3 Encounters:  03/23/21 142 lb (64.4 kg)  02/19/21 141 lb (64 kg)  12/22/20 136 lb 12 oz (62 kg)    Physical Exam Vitals signs reviewed.  Constitutional:      Comments: Breast exam bilaterally shows no breast masses.  She has no nipple discharge bilaterally.  She has no breast swelling or erythema.  There is no bilateral axillary adenopathy.  HENT:     Head: Normocephalic and atraumatic.  Eyes:     Pupils: Pupils are equal, round, and reactive to light.  Neck:      Musculoskeletal: Normal range of motion.  Cardiovascular:     Rate and Rhythm: Normal rate and regular rhythm.     Heart sounds: Normal heart sounds.  Pulmonary:     Effort: Pulmonary effort is normal.     Breath sounds: Normal breath sounds.  Abdominal:     General: Bowel sounds are normal.     Palpations: Abdomen is soft.     Comments: Abdominal exam shows the healing laparotomy scar.  She has no swelling.  There is no erythema about the surgical site.  She has been tenderness to palpation in the right lower quadrant.  No masses noted.  Bowel sounds are present.  There is no palpable liver or spleen tip.  Musculoskeletal: Normal range of motion.        General: No tenderness or deformity.  Lymphadenopathy:     Cervical: No cervical adenopathy.  Skin:    General: Skin is warm and dry.     Findings: No erythema or rash.  Neurological:     Mental Status: She is alert and oriented to person, place, and time.  Psychiatric:        Behavior: Behavior normal.        Thought Content: Thought content normal.        Judgment: Judgment normal.      Lab Results  Component Value Date   WBC 5.3 03/23/2021   HGB 12.9 03/23/2021   HCT 38.4 03/23/2021   MCV 98.7 03/23/2021   PLT 120 (L) 03/23/2021     Chemistry      Component Value Date/Time   NA 137 03/23/2021 1130   K 4.4 03/23/2021 1130   CL 100 03/23/2021 1130   CO2 28 03/23/2021 1130   BUN 19 03/23/2021 1130   CREATININE 1.04 (H) 03/23/2021 1130      Component Value Date/Time   CALCIUM 9.7 03/23/2021 1130   ALKPHOS 58 03/23/2021 1130   AST 21 03/23/2021 1130   ALT 13 03/23/2021 1130   BILITOT 0.4 03/23/2021 1130       Impression and Plan: Ms. Cloe is a 70 year old postmenopausal female.  She has metastatic lobular carcinoma of the left breast.  We finally found her primary after doing a breast MRI.  She had resection of the colonic mass.  She had multiple positive lymph nodes.    Again, we will set the PET scan up  for her..  I really think that she is done so well.  We get her through the holiday season.  I would like to get her back in 4 months.  Hopefully when we see  her back, there will be good news about her son and daughter-in-law hopefully expecting a child.   [name], MD

## 2021-03-24 LAB — CANCER ANTIGEN 27.29: CA 27.29: 32.9 U/mL (ref 0.0–38.6)

## 2021-03-26 ENCOUNTER — Other Ambulatory Visit (HOSPITAL_COMMUNITY): Payer: Self-pay

## 2021-03-29 ENCOUNTER — Encounter: Payer: Self-pay | Admitting: *Deleted

## 2021-03-30 ENCOUNTER — Telehealth: Payer: Self-pay

## 2021-03-30 ENCOUNTER — Other Ambulatory Visit: Payer: Self-pay

## 2021-03-30 ENCOUNTER — Encounter: Payer: Self-pay | Admitting: Nurse Practitioner

## 2021-03-30 ENCOUNTER — Ambulatory Visit (INDEPENDENT_AMBULATORY_CARE_PROVIDER_SITE_OTHER): Payer: Medicare Other | Admitting: Nurse Practitioner

## 2021-03-30 DIAGNOSIS — Z0000 Encounter for general adult medical examination without abnormal findings: Secondary | ICD-10-CM

## 2021-03-30 NOTE — Progress Notes (Signed)
   This service is provided via telemedicine  No vital signs collected/recorded due to the encounter was a telemedicine visit.   Location of patient (ex: home, work):  Home  Patient consents to a telephone visit: Yes, see telephone visit dated 03/30/21  Location of the provider (ex: office, home):  [place]  Name of any referring provider:  N/A  Names of all persons participating in the telemedicine service and their role in the encounter:  S.Chrae B/CMA, Sherrie Mustache, NP, and Patient   Time spent on call:  9 min with medical assistant

## 2021-03-30 NOTE — Progress Notes (Signed)
Subjective:   Martha Osborne is a 70 y.o. female who presents for Medicare Annual (Subsequent) preventive examination.  Review of Systems           Objective:    There were no vitals filed for this visit. There is no height or weight on file to calculate BMI.  Advanced Directives 03/30/2021 03/23/2021 02/19/2021 12/22/2020 10/27/2020 09/11/2020 06/23/2020  Does Patient Have a Medical Advance Directive? Yes Yes Yes Yes Yes Yes Yes  Type of Paramedic of [place];Living will Healthcare Power of [place];Living will [place];Living will [place];Living will [place];Living will  Does patient want to make changes to medical advance directive? No - Patient declined No - Patient declined No - Patient declined No - Patient declined No - Patient declined No - Patient declined No - Patient declined  Copy of [place] in Chart? Yes - validated most recent copy scanned in chart (See row information) No - copy requested Yes - validated most recent copy scanned in chart (See row information) No - copy requested No - copy requested - No - copy requested  Would patient like information on creating a medical advance directive? - No - Patient declined - - - - -    Current Medications (verified) Outpatient Encounter Medications as of 03/30/2021  Medication Sig   abemaciclib (VERZENIO) 150 MG tablet TAKE 1 TABLET (150 MG TOTAL) BY MOUTH DAILY.   Carboxymeth-Glycerin-Polysorb (REFRESH OPTIVE ADVANCED) 0.5-1-0.5 % SOLN Apply 1 drop to eye daily as needed (Both eyes).   Cholecalciferol (VITAMIN D3 PO) Take 2,000 Units by mouth daily.   denosumab (PROLIA) 60 MG/ML SOSY injection Inject 60 mg into the skin every 6 (six) months.   letrozole (FEMARA) 2.5 MG tablet TAKE 1 TABLET BY MOUTH ONCE A DAY   loperamide (IMODIUM) 2 MG capsule Take 2 mg by mouth as  needed.   Misc. Devices (ELON PROFESSIONAL NAIL CARE) MISC 1 application by Does not apply route at bedtime.   Psyllium (METAMUCIL) 48.57 % POWD 1 packet with 8 ounces of liquid as needed   No facility-administered encounter medications on file as of 03/30/2021.    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, right ([place]) 17/61/6073   Complication of anesthesia    very sensitive to meds   Goals of care, counseling/discussion 08/30/2018   History of bone density study 09/29/2020   History of colonoscopy 11/24/2020   History of EKG 10/10/2018   History of mammogram 01/17/2020   History of MRI    Breast (03/12/2020), Brain (06/13/2019) and Knee (02/23/2018& 05/22/2018)   Osteoporosis    Personal history of chemotherapy    Shingles    Skin cancer    basal cell x 2 (on back and on nose)   Tinnitus of left ear    Past Surgical History:  Procedure Laterality Date   bone cyst removed Right 1978   BREAST BIOPSY Left 08/28/2018   COLON RESECTION Right 09/14/2018   Procedure: LAPAROSCOPIC  ASSISTED RIGHT COLECTOMY;  Surgeon: Fanny Skates, MD;  Location: [facility];  Service: General;  Laterality: Right;   COLONOSCOPY     LAPAROSCOPIC RIGHT COLECTOMY  09/14/2018   Family History  Problem Relation Age of Onset   Dementia Mother    Congestive Heart  Failure Father    Early death Paternal Aunt    Dementia Maternal Grandmother    Breast cancer Neg Hx    Social History   Socioeconomic History   Marital status: Married    Spouse name: Not on file   Number of children: Not on file   Years of education: Not on file   Highest education level: Not on file  Occupational History   Not on file  Tobacco Use   Smoking status: Never   Smokeless tobacco: Never  Vaping Use   Vaping Use: Never used  Substance and Sexual Activity   Alcohol use: Yes    Comment: very  rarely   Drug use: Never   Sexual activity: Not on file  Other Topics Concern   Not on file  Social History Narrative   Diet: Omnivore      Caffeine: No      Married, if yes what year: Married/1975      Do you live in a house, apartment, assisted living, condo, trailer, ect: Rental townhouse      Is it one or more stories: 2      How many persons live in your home? 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 No   Do you have difficulty preparing food or eating? No   Do you have difficulty managing your medications? No   Do you have difficulty managing your finances? No   Do you have difficulty affording your medications? No   Social Determinants of Radio broadcast assistant Strain: Not on file  Food Insecurity: Not on file  Transportation Needs: Not on file  Physical Activity: Not on file  Stress: Not on file  Social Connections: Not on file    Tobacco Counseling Counseling given: Not Answered   Clinical Intake:                 Diabetic?no         Activities of Daily Living No flowsheet data found.  Patient Care Team: Lauree Chandler, NP as PCP - General (Geriatric Medicine) Volanda Napoleon, MD as Medical Oncologist (Oncology) Stark Klein, MD as Consulting Physician (General Surgery) Jari Pigg, MD as Consulting Physician (Dermatology) Dian Queen, MD as Consulting Physician (Obstetrics and Gynecology)  Indicate any recent Medical Services you may have received from other than [facility] providers in the past year (date may be approximate).     Assessment:   This is a  routine wellness examination for [facility].  Hearing/Vision screen Hearing Screening - Comments:: Decreased hearing, was evaluated by an audiologist 1-2 years ago. No hearing aids Vision Screening - Comments:: Last eye exam less than 1 year ago, pending appointment next week. [facility]  Dietary issues and exercise activities discussed:     Goals Addressed   None    Depression Screen PHQ 2/9 Scores 03/30/2021 02/19/2021  PHQ - 2  Score 0 0    Fall Risk Fall Risk  03/30/2021 02/19/2021  Falls in the past year? 1 1  Number falls in past yr: 0 0  Injury with Fall? 0 0  Risk for fall due to : No Fall Risks No Fall Risks  Follow up Falls evaluation completed Falls evaluation completed    [place]:  Any stairs in or around the home? Yes  If so, are there any without handrails? No  Home free of loose throw rugs in walkways, pet beds, electrical cords, etc? Yes  Adequate lighting in your home to reduce risk of falls? Yes   ASSISTIVE DEVICES UTILIZED TO PREVENT FALLS:  Life alert? No  Use of a cane, walker or w/c? No  Grab bars in the bathroom? No  Shower chair or bench in shower? No  Elevated toilet seat or a handicapped toilet? No   TIMED UP AND GO:  Was the test performed? No .    Cognitive Function:     6CIT Screen 03/30/2021  What Year? 0 points  What month? 0 points  What time? 0 points  Count back from 20 0 points  Months in reverse 0 points  Repeat phrase 0 points  Total Score 0    Immunizations Immunization History  Administered Date(s) Administered   Influenza, High Dose Seasonal PF 05/01/2019   Influenza, Quadrivalent, Recombinant, Inj, Pf 04/19/2018   Influenza-Unspecified 06/04/2018, 05/01/2019, 04/23/2020   Moderna SARS-COV2 Booster Vaccination 01/23/2021   Moderna Sars-Covid-2 Vaccination 08/15/2019, 09/13/2019, 05/15/2020   Tdap 06/21/2011   Zoster Recombinat (Shingrix) 03/22/2012    TDAP status: Up  to date  Flu Vaccine status: Due, Education has been provided regarding the importance of this vaccine. Advised may receive this vaccine at local pharmacy or Health Dept. Aware to provide a copy of the vaccination record if obtained from local pharmacy or Health Dept. Verbalized acceptance and understanding.  Pneumococcal vaccine status: Due, Education has been provided regarding the importance of this vaccine. Advised may receive this vaccine at local pharmacy or Health Dept. Aware to provide a copy of the vaccination record if obtained from local pharmacy or Health Dept. Verbalized acceptance and understanding.  Covid-19 vaccine status: Information provided on how to obtain vaccines.   Qualifies for Shingles Vaccine? Yes   Zostavax completed Yes   Shingrix Completed?: No.    Education has been provided regarding the importance of this vaccine. Patient has been advised to call insurance company to determine out of pocket expense if they have not yet received this vaccine. Advised may also receive vaccine at local pharmacy or Health Dept. Verbalized acceptance and understanding.  Screening Tests Health Maintenance  Topic Date Due   Hepatitis C Screening  Never done   COLONOSCOPY (Pts 45-25yrs Insurance coverage will need to be confirmed)  Never done   Zoster Vaccines- Shingrix (2 of 2) 05/17/2012   INFLUENZA VACCINE  02/01/2021   COVID-19 Vaccine (5 - Booster for Moderna series) 05/26/2021   TETANUS/TDAP  06/20/2021   MAMMOGRAM  01/16/2022   DEXA SCAN  Completed   HPV VACCINES  Aged Out    Health Maintenance  Health Maintenance Due  Topic Date Due   Hepatitis C Screening  Never done   COLONOSCOPY (Pts 45-36yrs Insurance coverage will need to be confirmed)  Never done   Zoster Vaccines- Shingrix (2 of 2) 05/17/2012   INFLUENZA VACCINE  02/01/2021    Colorectal cancer screening: Type of screening: Colonoscopy. Completed  11/17/20. Repeat every 10 years  Mammogram status: Completed   . Repeat every year  Bone Density status: Completed 09/29/20. Results reflect: Bone density results: OSTEOPOROSIS. Repeat every 2 years.  Lung Cancer Screening: (Low Dose CT Chest recommended if Age 73-80 years, 30 pack-year currently smoking OR have quit w/in 15years.) does not qualify.   Lung Cancer Screening Referral: na  Additional Screening:  Hepatitis C Screening: does qualify; Completed with next labs if not already done  Vision Screening: Recommended annual ophthalmology exams for early detection of glaucoma and other disorders of the eye. Is the patient up to date with their annual eye exam?  Yes  Who is the provider or what is the name of the office in which the patient attends annual eye exams? [facility] If pt is not established with a provider, would they like to be referred to a provider to establish care? No .   Dental Screening: Recommended annual dental exams for proper oral hygiene  Community Resource Referral / Chronic Care Management: CRR required this visit?  No   CCM required this visit?  No      Plan:     I have personally reviewed and noted the following in the patient's chart:   Medical and social history Use of alcohol, tobacco or illicit drugs  Current medications and supplements including opioid prescriptions.  Functional ability and status Nutritional status Physical activity Advanced directives List of other physicians Hospitalizations, surgeries, and ER visits in previous 12 months Vitals Screenings to include cognitive, depression, and falls Referrals and appointments  In addition, I have reviewed and discussed with patient certain preventive protocols, quality metrics, and best practice recommendations. A written personalized care plan for preventive services as well as general preventive health recommendations were provided to patient.     Lauree Chandler, NP   03/30/2021    Virtual Visit via Telephone Note  I connected  [email or web address] on 03/30/21 at  8:30 AM EDT by telephone and verified that I am speaking with the correct person using two identifiers.  Location: Patient: home Provider: [facility]    I discussed the limitations, risks, security and privacy concerns of performing an evaluation and management service by telephone and the availability of in person appointments. I also discussed with the patient that there may be a patient responsible charge related to this service. The patient expressed understanding and agreed to proceed.   I discussed the assessment and treatment plan with the patient. The patient was provided an opportunity to ask questions and all were answered. The patient agreed with the plan and demonstrated an understanding of the instructions.   The patient was advised to call back or seek an in-person evaluation if the symptoms worsen or if the condition fails to improve as anticipated.  I provided 15 minutes of non-face-to-face time during this encounter.  Carlos American. Harle Battiest Avs printed and mailed

## 2021-03-30 NOTE — Telephone Encounter (Signed)
[name], Martha Osborne are scheduled for a virtual visit with your provider today.    Just as we do with appointments in the office, we must obtain your consent to participate.  Your consent will be active for this visit and any virtual visit you may have with one of our providers in the next 365 days.    If you have a MyChart account, I can also send a copy of this consent to you electronically.  All virtual visits are billed to your insurance company just like a traditional visit in the office.  As this is a virtual visit, video technology does not allow for your provider to perform a traditional examination.  This may limit your provider's ability to fully assess your condition.  If your provider identifies any concerns that need to be evaluated in person or the need to arrange testing such as labs, EKG, etc, we will make arrangements to do so.    Although advances in technology are sophisticated, we cannot ensure that it will always work on either your end or our end.  If the connection with a video visit is poor, we may have to switch to a telephone visit.  With either a video or telephone visit, we are not always able to ensure that we have a secure connection.   I need to obtain your verbal consent now.   Are you willing to proceed with your visit today?   Martha Osborne has provided verbal consent on 03/30/2021 for a virtual visit (video or telephone).   Leigh Aurora Catharine, [place] 03/30/2021  8:37 AM

## 2021-03-30 NOTE — Patient Instructions (Signed)
[name] , Thank you for taking time to come for your Medicare Wellness Visit. I appreciate your ongoing commitment to your health goals. Please review the following plan we discussed and let me know if I can assist you in the future.   Screening recommendations/referrals: Colonoscopy up to date Mammogram up to date Bone Density up to date Recommended yearly ophthalmology/optometry visit for glaucoma screening and checkup Recommended yearly dental visit for hygiene and checkup  Vaccinations: Influenza vaccine RECOMMENDED yearly Pneumococcal vaccine up to date Tdap vaccine up to date Shingles vaccine RECOMMENDED at local pharmacy  COVID booster- recommend at local pharmacy   Advanced directives: on file.   Conditions/risks identified: Aortic Atherosclerosis   Next appointment: yearly for awv   Preventive Care 65 Years and Older, Female Preventive care refers to lifestyle choices and visits with your health care provider that can promote health and wellness. What does preventive care include? A yearly physical exam. This is also called an annual well check. Dental exams once or twice a year. Routine eye exams. Ask your health care provider how often you should have your eyes checked. Personal lifestyle choices, including: Daily care of your teeth and gums. Regular physical activity. Eating a healthy diet. Avoiding tobacco and drug use. Limiting alcohol use. Practicing safe sex. Taking low-dose aspirin every day. Taking vitamin and mineral supplements as recommended by your health care provider. What happens during an annual well check? The services and screenings done by your health care provider during your annual well check will depend on your age, overall health, lifestyle risk factors, and family history of disease. Counseling  Your health care provider may ask you questions about your: Alcohol use. Tobacco use. Drug use. Emotional well-being. Home and relationship  well-being. Sexual activity. Eating habits. History of falls. Memory and ability to understand (cognition). Work and work Statistician. Reproductive health. Screening  You may have the following tests or measurements: Height, weight, and BMI. Blood pressure. Lipid and cholesterol levels. These may be checked every 5 years, or more frequently if you are over 28 years old. Skin check. Lung cancer screening. You may have this screening every year starting at age 70 if you have a 30-pack-year history of smoking and currently smoke or have quit within the past 15 years. Fecal occult blood test (FOBT) of the stool. You may have this test every year starting at age 70. Flexible sigmoidoscopy or colonoscopy. You may have a sigmoidoscopy every 5 years or a colonoscopy every 10 years starting at age 70. Hepatitis C blood test. Hepatitis B blood test. Sexually transmitted disease (STD) testing. Diabetes screening. This is done by checking your blood sugar (glucose) after you have not eaten for a while (fasting). You may have this done every 1-3 years. Bone density scan. This is done to screen for osteoporosis. You may have this done starting at age 70. Mammogram. This may be done every 1-2 years. Talk to your health care provider about how often you should have regular mammograms. Talk with your health care provider about your test results, treatment options, and if necessary, the need for more tests. Vaccines  Your health care provider may recommend certain vaccines, such as: Influenza vaccine. This is recommended every year. Tetanus, diphtheria, and acellular pertussis (Tdap, Td) vaccine. You may need a Td booster every 10 years. Zoster vaccine. You may need this after age 70. Pneumococcal 13-valent conjugate (PCV13) vaccine. One dose is recommended after age 70. Pneumococcal polysaccharide (PPSV23) vaccine. One dose is recommended  after age 70. Talk to your health care provider about which  screenings and vaccines you need and how often you need them. This information is not intended to replace advice given to you by your health care provider. Make sure you discuss any questions you have with your health care provider. Document Released: 07/17/2015 Document Revised: 03/09/2016 Document Reviewed: 04/21/2015 Elsevier Interactive Patient Education  2017 [employer] Prevention in the Home Falls can cause injuries. They can happen to people of all ages. There are many things you can do to make your home safe and to help prevent falls. What can I do on the outside of my home? Regularly fix the edges of walkways and driveways and fix any cracks. Remove anything that might make you trip as you walk through a door, such as a raised step or threshold. Trim any bushes or trees on the path to your home. Use bright outdoor lighting. Clear any walking paths of anything that might make someone trip, such as rocks or tools. Regularly check to see if handrails are loose or broken. Make sure that both sides of any steps have handrails. Any raised decks and porches should have guardrails on the edges. Have any leaves, snow, or ice cleared regularly. Use sand or salt on walking paths during winter. Clean up any spills in your garage right away. This includes oil or grease spills. What can I do in the bathroom? Use night lights. Install grab bars by the toilet and in the tub and shower. Do not use towel bars as grab bars. Use non-skid mats or decals in the tub or shower. If you need to sit down in the shower, use a plastic, non-slip stool. Keep the floor dry. Clean up any water that spills on the floor as soon as it happens. Remove soap buildup in the tub or shower regularly. Attach bath mats securely with double-sided non-slip rug tape. Do not have throw rugs and other things on the floor that can make you trip. What can I do in the bedroom? Use night lights. Make sure that you have a  light by your bed that is easy to reach. Do not use any sheets or blankets that are too big for your bed. They should not hang down onto the floor. Have a firm chair that has side arms. You can use this for support while you get dressed. Do not have throw rugs and other things on the floor that can make you trip. What can I do in the kitchen? Clean up any spills right away. Avoid walking on wet floors. Keep items that you use a lot in easy-to-reach places. If you need to reach something above you, use a strong step stool that has a grab bar. Keep electrical cords out of the way. Do not use floor polish or wax that makes floors slippery. If you must use wax, use non-skid floor wax. Do not have throw rugs and other things on the floor that can make you trip. What can I do with my stairs? Do not leave any items on the stairs. Make sure that there are handrails on both sides of the stairs and use them. Fix handrails that are broken or loose. Make sure that handrails are as long as the stairways. Check any carpeting to make sure that it is firmly attached to the stairs. Fix any carpet that is loose or worn. Avoid having throw rugs at the top or bottom of the stairs. If you  do have throw rugs, attach them to the floor with carpet tape. Make sure that you have a light switch at the top of the stairs and the bottom of the stairs. If you do not have them, ask someone to add them for you. What else can I do to help prevent falls? Wear shoes that: Do not have high heels. Have rubber bottoms. Are comfortable and fit you well. Are closed at the toe. Do not wear sandals. If you use a stepladder: Make sure that it is fully opened. Do not climb a closed stepladder. Make sure that both sides of the stepladder are locked into place. Ask someone to hold it for you, if possible. Clearly mark and make sure that you can see: Any grab bars or handrails. First and last steps. Where the edge of each step  is. Use tools that help you move around (mobility aids) if they are needed. These include: Canes. Walkers. Scooters. Crutches. Turn on the lights when you go into a dark area. Replace any light bulbs as soon as they burn out. Set up your furniture so you have a clear path. Avoid moving your furniture around. If any of your floors are uneven, fix them. If there are any pets around you, be aware of where they are. Review your medicines with your doctor. Some medicines can make you feel dizzy. This can increase your chance of falling. Ask your doctor what other things that you can do to help prevent falls. This information is not intended to replace advice given to you by your health care provider. Make sure you discuss any questions you have with your health care provider. Document Released: 04/16/2009 Document Revised: 11/26/2015 Document Reviewed: 07/25/2014 Elsevier Interactive Patient Education  2017 [employer].

## 2021-04-01 ENCOUNTER — Ambulatory Visit: Payer: Medicare Other | Admitting: Nurse Practitioner

## 2021-04-06 DIAGNOSIS — H2513 Age-related nuclear cataract, bilateral: Secondary | ICD-10-CM | POA: Diagnosis not present

## 2021-04-06 DIAGNOSIS — H43391 Other vitreous opacities, right eye: Secondary | ICD-10-CM | POA: Diagnosis not present

## 2021-04-06 DIAGNOSIS — H43812 Vitreous degeneration, left eye: Secondary | ICD-10-CM | POA: Diagnosis not present

## 2021-04-06 DIAGNOSIS — H1045 Other chronic allergic conjunctivitis: Secondary | ICD-10-CM | POA: Diagnosis not present

## 2021-04-13 ENCOUNTER — Other Ambulatory Visit: Payer: Self-pay

## 2021-04-13 ENCOUNTER — Inpatient Hospital Stay: Payer: Medicare Other | Attending: Hematology & Oncology

## 2021-04-13 VITALS — BP 123/45 | HR 64 | Temp 98.4°F | Resp 18

## 2021-04-13 DIAGNOSIS — C785 Secondary malignant neoplasm of large intestine and rectum: Secondary | ICD-10-CM

## 2021-04-13 DIAGNOSIS — M810 Age-related osteoporosis without current pathological fracture: Secondary | ICD-10-CM | POA: Diagnosis not present

## 2021-04-13 DIAGNOSIS — C50911 Malignant neoplasm of unspecified site of right female breast: Secondary | ICD-10-CM

## 2021-04-13 MED ORDER — DENOSUMAB 60 MG/ML ~~LOC~~ SOSY
60.0000 mg | PREFILLED_SYRINGE | Freq: Once | SUBCUTANEOUS | Status: AC
  Administered 2021-04-13: 60 mg via SUBCUTANEOUS
  Filled 2021-04-13: qty 1

## 2021-04-13 NOTE — Patient Instructions (Signed)
Denosumab injection What is this medication? DENOSUMAB (den oh sue mab) slows bone breakdown. Prolia is used to treat osteoporosis in women after menopause and in men, and in people who are taking corticosteroids for 6 months or more. Xgeva is used to treat a high calcium level due to cancer and to prevent bone fractures and other bone problems caused by multiple myeloma or cancer bone metastases. Xgeva is also used to treat giant cell tumor of the bone. This medicine may be used for other purposes; ask your health care provider or pharmacist if you have questions. COMMON BRAND NAME(S): Prolia, XGEVA What should I tell my care team before I take this medication? They need to know if you have any of these conditions: dental disease having surgery or tooth extraction infection kidney disease low levels of calcium or Vitamin D in the blood malnutrition on hemodialysis skin conditions or sensitivity thyroid or parathyroid disease an unusual reaction to denosumab, other medicines, foods, dyes, or preservatives pregnant or trying to get pregnant breast-feeding How should I use this medication? This medicine is for injection under the skin. It is given by a health care professional in a hospital or clinic setting. A special MedGuide will be given to you before each treatment. Be sure to read this information carefully each time. For Prolia, talk to your pediatrician regarding the use of this medicine in children. Special care may be needed. For Xgeva, talk to your pediatrician regarding the use of this medicine in children. While this drug may be prescribed for children as young as 13 years for selected conditions, precautions do apply. Overdosage: If you think you have taken too much of this medicine contact a poison control center or emergency room at once. NOTE: This medicine is only for you. Do not share this medicine with others. What if I miss a dose? It is important not to miss your dose.  Call your doctor or health care professional if you are unable to keep an appointment. What may interact with this medication? Do not take this medicine with any of the following medications: other medicines containing denosumab This medicine may also interact with the following medications: medicines that lower your chance of fighting infection steroid medicines like prednisone or cortisone This list may not describe all possible interactions. Give your health care provider a list of all the medicines, herbs, non-prescription drugs, or dietary supplements you use. Also tell them if you smoke, drink alcohol, or use illegal drugs. Some items may interact with your medicine. What should I watch for while using this medication? Visit your doctor or health care professional for regular checks on your progress. Your doctor or health care professional may order blood tests and other tests to see how you are doing. Call your doctor or health care professional for advice if you get a fever, chills or sore throat, or other symptoms of a cold or flu. Do not treat yourself. This drug may decrease your body's ability to fight infection. Try to avoid being around people who are sick. You should make sure you get enough calcium and vitamin D while you are taking this medicine, unless your doctor tells you not to. Discuss the foods you eat and the vitamins you take with your health care professional. See your dentist regularly. Brush and floss your teeth as directed. Before you have any dental work done, tell your dentist you are receiving this medicine. Do not become pregnant while taking this medicine or for 5 months after   stopping it. Talk with your doctor or health care professional about your birth control options while taking this medicine. Women should inform their doctor if they wish to become pregnant or think they might be pregnant. There is a potential for serious side effects to an unborn child. Talk to  your health care professional or pharmacist for more information. What side effects may I notice from receiving this medication? Side effects that you should report to your doctor or health care professional as soon as possible: allergic reactions like skin rash, itching or hives, swelling of the face, lips, or tongue bone pain breathing problems dizziness jaw pain, especially after dental work redness, blistering, peeling of the skin signs and symptoms of infection like fever or chills; cough; sore throat; pain or trouble passing urine signs of low calcium like fast heartbeat, muscle cramps or muscle pain; pain, tingling, numbness in the hands or feet; seizures unusual bleeding or bruising unusually weak or tired Side effects that usually do not require medical attention (report to your doctor or health care professional if they continue or are bothersome): constipation diarrhea headache joint pain loss of appetite muscle pain runny nose tiredness upset stomach This list may not describe all possible side effects. Call your doctor for medical advice about side effects. You may report side effects to FDA at 1-800-FDA-1088. Where should I keep my medication? This medicine is only given in a clinic, doctor's office, or other health care setting and will not be stored at home. NOTE: This sheet is a summary. It may not cover all possible information. If you have questions about this medicine, talk to your doctor, pharmacist, or health care provider.  2022 Elsevier/Gold Standard (2017-10-27 16:10:44)

## 2021-04-16 DIAGNOSIS — Z23 Encounter for immunization: Secondary | ICD-10-CM | POA: Diagnosis not present

## 2021-04-23 ENCOUNTER — Other Ambulatory Visit (HOSPITAL_COMMUNITY): Payer: Self-pay

## 2021-04-27 ENCOUNTER — Other Ambulatory Visit (HOSPITAL_COMMUNITY): Payer: Self-pay

## 2021-04-30 ENCOUNTER — Encounter: Payer: Self-pay | Admitting: Nurse Practitioner

## 2021-04-30 ENCOUNTER — Other Ambulatory Visit: Payer: Self-pay

## 2021-04-30 ENCOUNTER — Other Ambulatory Visit (HOSPITAL_COMMUNITY): Payer: Self-pay

## 2021-04-30 ENCOUNTER — Ambulatory Visit (INDEPENDENT_AMBULATORY_CARE_PROVIDER_SITE_OTHER): Payer: Medicare Other | Admitting: Nurse Practitioner

## 2021-04-30 VITALS — BP 118/72 | HR 66 | Temp 97.1°F | Ht 66.0 in | Wt 142.0 lb

## 2021-04-30 DIAGNOSIS — J302 Other seasonal allergic rhinitis: Secondary | ICD-10-CM

## 2021-04-30 MED ORDER — CETIRIZINE HCL 10 MG PO TABS: 10.0000 mg | ORAL_TABLET | Freq: Every day | ORAL | 11 refills | Status: DC

## 2021-04-30 MED ORDER — FLUTICASONE PROPIONATE 50 MCG/ACT NA SUSP: 1.0000 | Freq: Every day | NASAL | 0 refills | Status: DC

## 2021-04-30 NOTE — Patient Instructions (Addendum)
neti pot (nasal wash) daily Plain nasal saline spray throughout the day as needed May use tylenol 325 mg 2 tablets every 6 hours as needed aches and pains or sore throat To use zyrtec 10 mg daily at bedtime for 1 week then as needed  humidifier in the home to help with the dry air Keep well hydrate Avoid forcefully blowing nose Flonase 1 spray into both nares daily (after wash)   Notify for worsening of symptoms, fever, pain to ear, fatigue, etc

## 2021-04-30 NOTE — Progress Notes (Signed)
Careteam: Patient Care Team: Lauree Chandler, NP as PCP - General (Geriatric Medicine) Volanda Napoleon, MD as Medical Oncologist (Oncology) Stark Klein, MD as Consulting Physician (General Surgery) Jari Pigg, MD as Consulting Physician (Dermatology) Dian Queen, MD as Consulting Physician (Obstetrics and Gynecology)  PLACE OF SERVICE:  [place] Directive information    Allergies  Allergen Reactions   Kisqali (200 Mg Dose) [Ribociclib Succ (200 Mg Dose)] Rash and Other (See Comments)    Itchy, scratchy throat.    Chief Complaint  Patient presents with   Acute Visit    Left ear congestion x 3 weeks. Patient will hold off on getting flu vaccine (plans to get next week at another location)      HPI: Patient is a 70 y.o. female for the last 2-3 years has had seasonal allergies in the fall.  Reports this year it has started in her ear and has been ongoing for 2 weeks.  Has a lot of sensitivity to medicine so she will take a little pseudoephedrine but has only used twice in the last 2 weeks.  Increase pressure and can hear an echo on right side.  No fever or chills.    Review of Systems:  Review of Systems  Constitutional:  Negative for chills, fever and weight loss.  HENT:  Positive for congestion, hearing loss and tinnitus. Negative for ear pain and sore throat.   Respiratory:  Negative for cough, sputum production and shortness of breath.   Cardiovascular:  Negative for chest pain, palpitations and leg swelling.  Gastrointestinal:  Negative for abdominal pain, constipation, diarrhea and heartburn.  Genitourinary:  Negative for dysuria, frequency and urgency.  Skin: Negative.   Neurological:  Negative for dizziness and headaches.   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, right ([place]) 37/16/9678   Complication of anesthesia    very sensitive to meds   Goals of care, counseling/discussion  08/30/2018   History of bone density study 09/29/2020   History of colonoscopy 11/24/2020   History of EKG 10/10/2018   History of mammogram 01/17/2020   History of MRI    Breast (03/12/2020), Brain (06/13/2019) and Knee (02/23/2018& 05/22/2018)   Osteoporosis    Personal history of chemotherapy    Shingles    Skin cancer    basal cell x 2 (on back and on nose)   Tinnitus of left ear    Past Surgical History:  Procedure Laterality Date   bone cyst removed Right 1978   BREAST BIOPSY Left 08/28/2018   COLON RESECTION Right 09/14/2018   Procedure: LAPAROSCOPIC  ASSISTED RIGHT COLECTOMY;  Surgeon: Fanny Skates, MD;  Location: [facility];  Service: General;  Laterality: Right;   COLONOSCOPY     LAPAROSCOPIC RIGHT COLECTOMY  09/14/2018   Social History:   reports that she has never smoked. She has never used smokeless tobacco. She reports current alcohol use. She reports that she does not use drugs.  Family History  Problem Relation Age of Onset   Dementia Mother    Congestive Heart Failure Father    Early death Paternal Aunt    Dementia Maternal Grandmother    Breast cancer Neg Hx     Medications: Patient's Medications  New Prescriptions   No medications on file  Previous Medications   ABEMACICLIB (VERZENIO) 150 MG TABLET    TAKE 1 TABLET (150 MG TOTAL) BY MOUTH DAILY.   CARBOXYMETH-GLYCERIN-POLYSORB (REFRESH OPTIVE ADVANCED) 0.5-1-0.5 %  SOLN    Apply 1 drop to eye daily as needed (Both eyes).   CHOLECALCIFEROL (VITAMIN D3 PO)    Take 2,000 Units by mouth daily.   DENOSUMAB (PROLIA) 60 MG/ML SOSY INJECTION    Inject 60 mg into the skin every 6 (six) months.   LETROZOLE (FEMARA) 2.5 MG TABLET    TAKE 1 TABLET BY MOUTH ONCE A DAY   LOPERAMIDE (IMODIUM) 2 MG CAPSULE    Take 2 mg by mouth as needed.   MISC. DEVICES (ELON PROFESSIONAL NAIL CARE) MISC    1 application by Does not apply route at bedtime.   PSYLLIUM (METAMUCIL) 48.57 % POWD    1 packet with 8 ounces of liquid as needed   Modified Medications   No medications on file  Discontinued Medications   No medications on file    Physical Exam:  Vitals:   04/30/21 1515  BP: 118/72  Pulse: 66  Temp: (!) 97.1 F (36.2 C)  TempSrc: Temporal  SpO2: 97%  Weight: 142 lb (64.4 kg)  Height: 5\' 6"  (1.676 m)   Body mass index is 22.92 kg/m. Wt Readings from Last 3 Encounters:  04/30/21 142 lb (64.4 kg)  03/23/21 142 lb (64.4 kg)  02/19/21 141 lb (64 kg)    Physical Exam Constitutional:      General: She is not in acute distress.    Appearance: She is well-developed. She is not diaphoretic.  HENT:     Head: Normocephalic and atraumatic.     Right Ear: Tympanic membrane and ear canal normal. No middle ear effusion.     Left Ear: Ear canal normal. A middle ear effusion is present. Tympanic membrane is not injected, perforated, erythematous, retracted or bulging.     Mouth/Throat:     Pharynx: No oropharyngeal exudate.  Eyes:     Conjunctiva/sclera: Conjunctivae normal.     Pupils: Pupils are equal, round, and reactive to light.  Cardiovascular:     Rate and Rhythm: Normal rate and regular rhythm.     Heart sounds: Normal heart sounds.  Pulmonary:     Effort: Pulmonary effort is normal.     Breath sounds: Normal breath sounds.  Abdominal:     General: Bowel sounds are normal.     Palpations: Abdomen is soft.  Musculoskeletal:     Cervical back: Normal range of motion and neck supple.     Right lower leg: No edema.     Left lower leg: No edema.  Skin:    General: Skin is warm and dry.  Neurological:     Mental Status: She is alert.  Psychiatric:        Mood and Affect: Mood normal.    Labs reviewed: Basic Metabolic Panel: Recent Labs    10/27/20 0952 12/22/20 1003 03/23/21 1130  NA 137 138 137  K 4.4 4.7 4.4  CL 101 102 100  CO2 30 29 28   GLUCOSE 89 100* 92  BUN 17 18 19   CREATININE 1.03* 1.06* 1.04*  CALCIUM 9.9 9.9 9.7   Liver Function Tests: Recent Labs    10/27/20 0952  12/22/20 1003 03/23/21 1130  AST 18 20 21   ALT 13 15 13   ALKPHOS 87 51 58  BILITOT 0.4 0.3 0.4  PROT 7.2 6.8 7.3  ALBUMIN 4.2 4.2 4.3   No results for input(s): LIPASE, AMYLASE in the last 8760 hours. No results for input(s): AMMONIA in the last 8760 hours. CBC: Recent Labs    10/27/20  5621 12/22/20 1003 03/23/21 1130  WBC 4.6 4.4 5.3  NEUTROABS 3.0 2.7 3.3  HGB 13.5 12.6 12.9  HCT 40.8 37.6 38.4  MCV 97.8 98.4 98.7  PLT 123* 114* 120*   Lipid Panel: No results for input(s): CHOL, HDL, LDLCALC, TRIG, CHOLHDL, LDLDIRECT in the last 8760 hours. TSH: No results for input(s): TSH in the last 8760 hours. A1C: Lab Results  Component Value Date   HGBA1C 5.4 09/03/2018     Assessment/Plan 1. Seasonal allergies -neti pot (nasal wash) daily Plain nasal saline spray throughout the day as needed May use tylenol 325 mg 2 tablets every 6 hours as needed aches and pains or sore throat To use zyrtec 10 mg daily at bedtime for 1 week then as needed  humidifier in the home to help with the dry air Keep well hydrate Avoid forcefully blowing nose Flonase 1 spray into both nares daily (after wash)  Notify for worsening of symptoms, fever, pain to ear, fatigue, etc - fluticasone (FLONASE) 50 MCG/ACT nasal spray; Place 1 spray into both nostrils daily.  Dispense: 16 g; Refill: 0 - cetirizine (ZYRTEC) 10 MG tablet; Take 1 tablet (10 mg total) by mouth daily.  Dispense: 30 tablet; Refill: [place], [facility] [phone]

## 2021-05-03 ENCOUNTER — Ambulatory Visit (HOSPITAL_COMMUNITY)
Admission: RE | Admit: 2021-05-03 | Discharge: 2021-05-03 | Disposition: A | Discharge status: Home / Self Care | Payer: Medicare Other | Source: Ambulatory Visit | Attending: Hematology & Oncology | Admitting: Hematology & Oncology

## 2021-05-03 ENCOUNTER — Other Ambulatory Visit: Payer: Self-pay

## 2021-05-03 DIAGNOSIS — I700 Atherosclerosis of aorta: Secondary | ICD-10-CM | POA: Diagnosis not present

## 2021-05-03 DIAGNOSIS — C50919 Malignant neoplasm of unspecified site of unspecified female breast: Secondary | ICD-10-CM | POA: Diagnosis not present

## 2021-05-03 DIAGNOSIS — C50911 Malignant neoplasm of unspecified site of right female breast: Secondary | ICD-10-CM | POA: Insufficient documentation

## 2021-05-03 DIAGNOSIS — C785 Secondary malignant neoplasm of large intestine and rectum: Secondary | ICD-10-CM | POA: Insufficient documentation

## 2021-05-03 DIAGNOSIS — Z9049 Acquired absence of other specified parts of digestive tract: Secondary | ICD-10-CM | POA: Diagnosis not present

## 2021-05-03 LAB — GLUCOSE, CAPILLARY: Glucose-Capillary: 94 mg/dL (ref 70–99)

## 2021-05-03 MED ORDER — FLUDEOXYGLUCOSE F - 18 (FDG) INJECTION
7.0000 | Freq: Once | INTRAVENOUS | Status: AC | PRN
  Administered 2021-05-03: 7.05 via INTRAVENOUS

## 2021-05-04 ENCOUNTER — Encounter: Payer: Self-pay | Admitting: *Deleted

## 2021-05-07 DIAGNOSIS — Z23 Encounter for immunization: Secondary | ICD-10-CM | POA: Diagnosis not present

## 2021-05-14 DIAGNOSIS — Z170 Estrogen receptor positive status [ER+]: Secondary | ICD-10-CM | POA: Diagnosis not present

## 2021-05-14 DIAGNOSIS — C50312 Malignant neoplasm of lower-inner quadrant of left female breast: Secondary | ICD-10-CM | POA: Diagnosis not present

## 2021-05-14 DIAGNOSIS — C50911 Malignant neoplasm of unspecified site of right female breast: Secondary | ICD-10-CM | POA: Diagnosis not present

## 2021-05-14 DIAGNOSIS — C785 Secondary malignant neoplasm of large intestine and rectum: Secondary | ICD-10-CM | POA: Diagnosis not present

## 2021-05-20 ENCOUNTER — Other Ambulatory Visit (HOSPITAL_COMMUNITY): Payer: Self-pay

## 2021-05-24 ENCOUNTER — Other Ambulatory Visit (HOSPITAL_COMMUNITY): Payer: Self-pay

## 2021-06-16 ENCOUNTER — Other Ambulatory Visit: Payer: Self-pay | Admitting: Hematology & Oncology

## 2021-06-16 ENCOUNTER — Other Ambulatory Visit (HOSPITAL_COMMUNITY): Payer: Self-pay

## 2021-06-16 DIAGNOSIS — C50911 Malignant neoplasm of unspecified site of right female breast: Secondary | ICD-10-CM

## 2021-06-16 MED ORDER — ABEMACICLIB 150 MG PO TABS
ORAL_TABLET | Freq: Every day | ORAL | 6 refills | Status: DC
  Filled 2021-06-16: qty 28, 28d supply, fill #0
  Filled 2021-06-17: qty 28, 28d supply, fill #1
  Filled 2021-08-16: qty 28, 28d supply, fill #2
  Filled 2021-09-09: qty 28, 28d supply, fill #3
  Filled 2021-10-04: qty 28, 28d supply, fill #4
  Filled 2021-11-01: qty 28, 28d supply, fill #5
  Filled 2021-11-25: qty 28, 28d supply, fill #6

## 2021-06-16 MED FILL — Letrozole Tab 2.5 MG: ORAL | 90 days supply | Qty: 90 | Fill #2 | Status: AC

## 2021-06-17 ENCOUNTER — Other Ambulatory Visit (HOSPITAL_COMMUNITY): Payer: Self-pay

## 2021-06-21 ENCOUNTER — Ambulatory Visit (INDEPENDENT_AMBULATORY_CARE_PROVIDER_SITE_OTHER): Payer: Medicare Other | Admitting: Nurse Practitioner

## 2021-06-21 ENCOUNTER — Encounter: Payer: Self-pay | Admitting: Nurse Practitioner

## 2021-06-21 ENCOUNTER — Other Ambulatory Visit: Payer: Self-pay

## 2021-06-21 VITALS — BP 116/64 | HR 70 | Temp 96.4°F | Wt 142.8 lb

## 2021-06-21 DIAGNOSIS — C785 Secondary malignant neoplasm of large intestine and rectum: Secondary | ICD-10-CM

## 2021-06-21 DIAGNOSIS — Z1159 Encounter for screening for other viral diseases: Secondary | ICD-10-CM | POA: Diagnosis not present

## 2021-06-21 DIAGNOSIS — C50911 Malignant neoplasm of unspecified site of right female breast: Secondary | ICD-10-CM | POA: Diagnosis not present

## 2021-06-21 DIAGNOSIS — I700 Atherosclerosis of aorta: Secondary | ICD-10-CM | POA: Diagnosis not present

## 2021-06-21 DIAGNOSIS — M810 Age-related osteoporosis without current pathological fracture: Secondary | ICD-10-CM

## 2021-06-21 DIAGNOSIS — J302 Other seasonal allergic rhinitis: Secondary | ICD-10-CM

## 2021-06-21 NOTE — Patient Instructions (Addendum)
Shingrix and TDAP vaccine at your local pharmacy   To use ice to right elbow 3 times daily ~20 mins for 5 days.  Can use muscle rub after ice Avoid heavy lifting

## 2021-06-21 NOTE — Progress Notes (Signed)
Careteam: Patient Care Team: Lauree Chandler, NP as PCP - General (Geriatric Medicine) Volanda Napoleon, MD as Medical Oncologist (Oncology) Stark Klein, MD as Consulting Physician (General Surgery) Jari Pigg, MD as Consulting Physician (Dermatology) Dian Queen, MD as Consulting Physician (Obstetrics and Gynecology)  PLACE OF SERVICE:  [place] Directive information    Allergies  Allergen Reactions   Kisqali (200 Mg Dose) [Ribociclib Succ (200 Mg Dose)] Rash and Other (See Comments)    Itchy, scratchy throat.    Chief Complaint  Patient presents with   Medical Management of Chronic Issues    4 month follow-up. Discuss need for hep c screening, shingrix, and td/tdap vaccine or post pone/exclude if patient refuses.      HPI: Patient is a 70 y.o. female for routine follow up.   Metastatic breast cancer- followed by oncologist and surgery.  Recent PET scan looked good. No evidence of metastatic on PET scan.  Not having nausea with medication at this time.  Diarrhea comes and goes   Reports unable to take ASA due to being on other medication.  Overall doing well without concerns.   Review of Systems:  Review of Systems  Constitutional:  Negative for chills, fever and weight loss.  HENT:  Negative for tinnitus.   Respiratory:  Negative for cough, sputum production and shortness of breath.   Cardiovascular:  Negative for chest pain, palpitations and leg swelling.  Gastrointestinal:  Negative for abdominal pain, constipation, diarrhea and heartburn.  Genitourinary:  Negative for dysuria, frequency and urgency.  Musculoskeletal:  Negative for back pain, falls, joint pain and myalgias.  Skin: Negative.   Neurological:  Negative for dizziness and headaches.  Psychiatric/Behavioral:  Negative for depression and memory loss. The patient does not have insomnia.    Past Medical History:  Diagnosis Date   Breast cancer metastasized to large  intestine, right ([place]) 65/68/1275   Complication of anesthesia    very sensitive to meds   Goals of care, counseling/discussion 08/30/2018   History of bone density study 09/29/2020   History of colonoscopy 11/24/2020   History of EKG 10/10/2018   History of mammogram 01/17/2020   History of MRI    Breast (03/12/2020), Brain (06/13/2019) and Knee (02/23/2018& 05/22/2018)   Osteoporosis    Personal history of chemotherapy    Shingles    Skin cancer    basal cell x 2 (on back and on nose)   Tinnitus of left ear    Past Surgical History:  Procedure Laterality Date   bone cyst removed Right 1978   BREAST BIOPSY Left 08/28/2018   COLON RESECTION Right 09/14/2018   Procedure: LAPAROSCOPIC  ASSISTED RIGHT COLECTOMY;  Surgeon: Fanny Skates, MD;  Location: [facility];  Service: General;  Laterality: Right;   COLONOSCOPY     LAPAROSCOPIC RIGHT COLECTOMY  09/14/2018   Social History:   reports that she has never smoked. She has never used smokeless tobacco. She reports current alcohol use. She reports that she does not use drugs.  Family History  Problem Relation Age of Onset   Dementia Mother    Congestive Heart Failure Father    Early death Paternal Aunt    Dementia Maternal Grandmother    Breast cancer Neg Hx     Medications: Patient's Medications  New Prescriptions   No medications on file  Previous Medications   ABEMACICLIB (VERZENIO) 150 MG TABLET    TAKE 1 TABLET (150 MG TOTAL) BY MOUTH DAILY.  CARBOXYMETH-GLYCERIN-POLYSORB (REFRESH OPTIVE ADVANCED) 0.5-1-0.5 % SOLN    Apply 1 drop to eye daily as needed (Both eyes).   CHOLECALCIFEROL (VITAMIN D3 PO)    Take 2,000 Units by mouth daily.   DENOSUMAB (PROLIA) 60 MG/ML SOSY INJECTION    Inject 60 mg into the skin every 6 (six) months.   LETROZOLE (FEMARA) 2.5 MG TABLET    TAKE 1 TABLET BY MOUTH ONCE A DAY   LOPERAMIDE (IMODIUM) 2 MG CAPSULE    Take 2 mg by mouth as needed.   MISC. DEVICES (ELON PROFESSIONAL NAIL CARE) MISC    1  application by Does not apply route at bedtime.   PSYLLIUM (METAMUCIL) 48.57 % POWD    1 packet with 8 ounces of liquid as needed  Modified Medications   No medications on file  Discontinued Medications   CETIRIZINE (ZYRTEC) 10 MG TABLET    Take 1 tablet (10 mg total) by mouth daily.   FLUTICASONE (FLONASE) 50 MCG/ACT NASAL SPRAY    Place 1 spray into both nostrils daily.    Physical Exam:  Vitals:   06/21/21 1300  BP: 116/64  Pulse: 70  Temp: (!) 96.4 F (35.8 C)  SpO2: 100%  Weight: 142 lb 12.8 oz (64.8 kg)   Body mass index is 23.05 kg/m. Wt Readings from Last 3 Encounters:  06/21/21 142 lb 12.8 oz (64.8 kg)  04/30/21 142 lb (64.4 kg)  03/23/21 142 lb (64.4 kg)    Physical Exam Constitutional:      General: She is not in acute distress.    Appearance: She is well-developed. She is not diaphoretic.  HENT:     Head: Normocephalic and atraumatic.     Mouth/Throat:     Pharynx: No oropharyngeal exudate.  Eyes:     Conjunctiva/sclera: Conjunctivae normal.     Pupils: Pupils are equal, round, and reactive to light.  Cardiovascular:     Rate and Rhythm: Normal rate and regular rhythm.     Heart sounds: Normal heart sounds.  Pulmonary:     Effort: Pulmonary effort is normal.     Breath sounds: Normal breath sounds.  Abdominal:     General: Bowel sounds are normal.     Palpations: Abdomen is soft.  Musculoskeletal:     Cervical back: Normal range of motion and neck supple.     Right lower leg: No edema.     Left lower leg: No edema.  Skin:    General: Skin is warm and dry.  Neurological:     Mental Status: She is alert.  Psychiatric:        Mood and Affect: Mood normal.    Labs reviewed: Basic Metabolic Panel: Recent Labs    10/27/20 0952 12/22/20 1003 03/23/21 1130  NA 137 138 137  K 4.4 4.7 4.4  CL 101 102 100  CO2 30 29 28   GLUCOSE 89 100* 92  BUN 17 18 19   CREATININE 1.03* 1.06* 1.04*  CALCIUM 9.9 9.9 9.7   Liver Function Tests: Recent Labs     10/27/20 0952 12/22/20 1003 03/23/21 1130  AST 18 20 21   ALT 13 15 13   ALKPHOS 87 51 58  BILITOT 0.4 0.3 0.4  PROT 7.2 6.8 7.3  ALBUMIN 4.2 4.2 4.3   No results for input(s): LIPASE, AMYLASE in the last 8760 hours. No results for input(s): AMMONIA in the last 8760 hours. CBC: Recent Labs    10/27/20 0952 12/22/20 1003 03/23/21 1130  WBC 4.6 4.4  5.3  NEUTROABS 3.0 2.7 3.3  HGB 13.5 12.6 12.9  HCT 40.8 37.6 38.4  MCV 97.8 98.4 98.7  PLT 123* 114* 120*   Lipid Panel: No results for input(s): CHOL, HDL, LDLCALC, TRIG, CHOLHDL, LDLDIRECT in the last 8760 hours. TSH: No results for input(s): TSH in the last 8760 hours. A1C: Lab Results  Component Value Date   HGBA1C 5.4 09/03/2018     Assessment/Plan .1. Aortic atherosclerosis ([place]) -discussed results of imaging. She would like to hold off on taking ASA. She will discuss with oncologist.  - Lipid panel  2. Osteoporosis without current pathological fracture, unspecified osteoporosis type -continues on prolia, with vit d, weight bearing exercises encouraged.   3. Seasonal allergies Stable at this time.   4. 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 Recent PET scan without signs of metastsis  5. Need for hepatitis C screening test - Hepatitis C antibody   Next appt: 6 months Sabri Teal K. [place], [facility] [phone]

## 2021-06-22 LAB — LIPID PANEL
Cholesterol: 178 mg/dL (ref <200)
HDL: 62 mg/dL (ref >=50)
LDL Cholesterol (Calc): 98 mg/dL (calc)
Non-HDL Cholesterol (Calc): 116 mg/dL (calc) (ref <130)
Total CHOL/HDL Ratio: 2.9 (calc) (ref <5.0)
Triglycerides: 90 mg/dL (ref <150)

## 2021-06-22 LAB — HEPATITIS C ANTIBODY
Hepatitis C Ab: NONREACTIVE
SIGNAL TO CUT-OFF: 0.05 (ref <1.00)

## 2021-07-07 ENCOUNTER — Other Ambulatory Visit (HOSPITAL_COMMUNITY): Payer: Self-pay

## 2021-07-09 ENCOUNTER — Other Ambulatory Visit: Payer: Self-pay

## 2021-07-09 ENCOUNTER — Inpatient Hospital Stay: Payer: Medicare Other | Attending: Hematology & Oncology

## 2021-07-09 ENCOUNTER — Encounter: Payer: Self-pay | Admitting: Hematology & Oncology

## 2021-07-09 ENCOUNTER — Inpatient Hospital Stay (HOSPITAL_BASED_OUTPATIENT_CLINIC_OR_DEPARTMENT_OTHER): Payer: Medicare Other | Admitting: Hematology & Oncology

## 2021-07-09 VITALS — BP 112/41 | HR 66 | Temp 98.8°F | Resp 16 | Wt 142.0 lb

## 2021-07-09 DIAGNOSIS — Z170 Estrogen receptor positive status [ER+]: Secondary | ICD-10-CM | POA: Diagnosis not present

## 2021-07-09 DIAGNOSIS — C50912 Malignant neoplasm of unspecified site of left female breast: Secondary | ICD-10-CM | POA: Insufficient documentation

## 2021-07-09 DIAGNOSIS — C785 Secondary malignant neoplasm of large intestine and rectum: Secondary | ICD-10-CM | POA: Diagnosis not present

## 2021-07-09 DIAGNOSIS — C50911 Malignant neoplasm of unspecified site of right female breast: Secondary | ICD-10-CM | POA: Diagnosis not present

## 2021-07-09 LAB — CBC WITH DIFFERENTIAL (CANCER CENTER ONLY)
Abs Immature Granulocytes: 0.02 10*3/uL (ref 0.00–0.07)
Basophils Absolute: 0 10*3/uL (ref 0.0–0.1)
Basophils Relative: 1 %
Eosinophils Absolute: 0 10*3/uL (ref 0.0–0.5)
Eosinophils Relative: 1 %
HCT: 37.1 % (ref 36.0–46.0)
Hemoglobin: 12.5 g/dL (ref 12.0–15.0)
Immature Granulocytes: 1 %
Lymphocytes Relative: 34 %
Lymphs Abs: 1.4 10*3/uL (ref 0.7–4.0)
MCH: 33.7 pg (ref 26.0–34.0)
MCHC: 33.7 g/dL (ref 30.0–36.0)
MCV: 100 fL (ref 80.0–100.0)
Monocytes Absolute: 0.3 10*3/uL (ref 0.1–1.0)
Monocytes Relative: 8 %
Neutro Abs: 2.3 10*3/uL (ref 1.7–7.7)
Neutrophils Relative %: 55 %
Platelet Count: 103 10*3/uL — ABNORMAL LOW (ref 150–400)
RBC: 3.71 MIL/uL — ABNORMAL LOW (ref 3.87–5.11)
RDW: 12.9 % (ref 11.5–15.5)
WBC Count: 4.2 10*3/uL (ref 4.0–10.5)
nRBC: 0 % (ref 0.0–0.2)

## 2021-07-09 LAB — CMP (CANCER CENTER ONLY)
ALT: 11 U/L (ref 0–44)
AST: 18 U/L (ref 15–41)
Albumin: 4 g/dL (ref 3.5–5.0)
Alkaline Phosphatase: 55 U/L (ref 38–126)
Anion gap: 5 (ref 5–15)
BUN: 20 mg/dL (ref 8–23)
CO2: 29 mmol/L (ref 22–32)
Calcium: 9.8 mg/dL (ref 8.9–10.3)
Chloride: 103 mmol/L (ref 98–111)
Creatinine: 1.14 mg/dL — ABNORMAL HIGH (ref 0.44–1.00)
GFR, Estimated: 52 mL/min — ABNORMAL LOW (ref >60)
Glucose, Bld: 97 mg/dL (ref 70–99)
Potassium: 4.6 mmol/L (ref 3.5–5.1)
Sodium: 137 mmol/L (ref 135–145)
Total Bilirubin: 0.4 mg/dL (ref 0.3–1.2)
Total Protein: 6.5 g/dL (ref 6.5–8.1)

## 2021-07-09 NOTE — Progress Notes (Signed)
Hematology and Oncology Follow Up Visit  Martha Osborne [id number] 1951/01/10 71 y.o. 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 daily Ribociclib 600 mg p.o. daily (21/7) -- d/c due to skin rash       Verzenio 150 mg po day - started on 06/12/2019        Prolia 60 mg IM q. 6 months --next dose in 11/2021    Interim History:  Martha Osborne is back for follow-up.  As always, she and her husband will be heading down to [place].  They are going down this Sunday for a month.  Their son and daughter-in-law live down there.  They will be seeing them.  She feels well.  She is seeing a new family doctor.  She is talking to the family doctor about cholesterol issues.  Thankfully, with her last cholesterol studies, her cholesterol/HDL ratio was only 2.9.  Martha Osborne is last CA 27.29 was still holding steady at 53.  She has had no abdominal pain.  There is been no problems with bowels or bladder.  She does have I think frequent bowel movements because of fiber intake.  She has had no cough or shortness of breath.  She has had no leg swelling.  She had no rashes.  Overall, her performance status is ECOG 0.    .    Medications:  Current Outpatient Medications:    abemaciclib (VERZENIO) 150 MG tablet, TAKE 1 TABLET (150 MG TOTAL) BY MOUTH DAILY., Disp: 28 tablet, Rfl: 6   Carboxymeth-Glycerin-Polysorb (REFRESH OPTIVE ADVANCED) 0.5-1-0.5 % SOLN, Apply 1 drop to eye daily as needed (Both eyes)., Disp: , Rfl:    Cholecalciferol (VITAMIN D3 PO), Take 2,000 Units by mouth daily., Disp: , Rfl:    denosumab (PROLIA) 60 MG/ML SOSY injection, Inject 60 mg into the skin every 6 (six) months., Disp: , Rfl:    letrozole (FEMARA) 2.5 MG tablet, TAKE 1 TABLET BY MOUTH ONCE A DAY, Disp: 90 tablet, Rfl: 6   loperamide (IMODIUM) 2 MG  capsule, Take 2 mg by mouth as needed., Disp: , Rfl:    Misc. Devices ([employer]) MISC, 1 application by Does not apply route at bedtime., Disp: , Rfl:    Psyllium (METAMUCIL) 48.57 % POWD, 1 packet with 8 ounces of liquid as needed, Disp: , Rfl:   Allergies:  Allergies  Allergen Reactions   Kisqali (200 Mg Dose) [Ribociclib Succ (200 Mg Dose)] Rash and Other (See Comments)    Itchy, scratchy throat.    Past Medical History, Surgical history, Social history, and Family History were reviewed and updated.  Review of Systems: Review of Systems  Constitutional: Negative.   HENT:  Negative.   Eyes: Negative.   Respiratory: Negative.   Cardiovascular: Negative.   Gastrointestinal: Positive for abdominal pain.  Endocrine: Negative.   Genitourinary: Negative.    Musculoskeletal: Negative.   Skin: Negative.   Neurological: Negative.   Hematological: Negative.   Psychiatric/Behavioral: Negative.     Physical Exam:  weight is 142 lb (64.4 kg). Her oral temperature is 98.8 F (37.1 C). Her blood pressure is 112/41 (abnormal) and her pulse is 66. Her respiration is 16 and oxygen saturation is 98%.   Wt Readings from Last 3 Encounters:  07/09/21 142 lb (64.4 kg)  06/21/21 142 lb 12.8 oz (64.8 kg)  04/30/21 142 lb (64.4 kg)    Physical Exam Vitals signs reviewed.  Constitutional:      Comments: Breast exam bilaterally shows no breast masses.  She has no nipple discharge bilaterally.  She has no breast swelling or erythema.  There is no bilateral axillary adenopathy.  HENT:     Head: Normocephalic and atraumatic.  Eyes:     Pupils: Pupils are equal, round, and reactive to light.  Neck:     Musculoskeletal: Normal range of motion.  Cardiovascular:     Rate and Rhythm: Normal rate and regular rhythm.     Heart sounds: Normal heart sounds.  Pulmonary:     Effort: Pulmonary effort is normal.     Breath sounds: Normal breath sounds.  Abdominal:     General: Bowel  sounds are normal.     Palpations: Abdomen is soft.     Comments: Abdominal exam shows the healing laparotomy scar.  She has no swelling.  There is no erythema about the surgical site.  She has been tenderness to palpation in the right lower quadrant.  No masses noted.  Bowel sounds are present.  There is no palpable liver or spleen tip.  Musculoskeletal: Normal range of motion.        General: No tenderness or deformity.  Lymphadenopathy:     Cervical: No cervical adenopathy.  Skin:    General: Skin is warm and dry.     Findings: No erythema or rash.  Neurological:     Mental Status: She is alert and oriented to person, place, and time.  Psychiatric:        Behavior: Behavior normal.        Thought Content: Thought content normal.        Judgment: Judgment normal.      Lab Results  Component Value Date   WBC 4.2 07/09/2021   HGB 12.5 07/09/2021   HCT 37.1 07/09/2021   MCV 100.0 07/09/2021   PLT 103 (L) 07/09/2021     Chemistry      Component Value Date/Time   NA 137 07/09/2021 1143   K 4.6 07/09/2021 1143   CL 103 07/09/2021 1143   CO2 29 07/09/2021 1143   BUN 20 07/09/2021 1143   CREATININE 1.14 (H) 07/09/2021 1143      Component Value Date/Time   CALCIUM 9.8 07/09/2021 1143   ALKPHOS 55 07/09/2021 1143   AST 18 07/09/2021 1143   ALT 11 07/09/2021 1143   BILITOT 0.4 07/09/2021 1143       Impression and Plan: Martha Osborne is a 71 year old postmenopausal female.  She has metastatic lobular carcinoma of the left breast.  We finally found her primary after doing a breast MRI.  She had resection of the colonic mass.  She had multiple positive lymph nodes.    Her next PET scan will be done the end of April.  I think this is reasonable since her last PET scan was done the end of October.  We will do Prolia when we see her back.  I know that she will be incredibly active down in [place].  She and her husband will be playing golf.  He is recovered from his bicep  surgery.  I will see her back in early May.   Martha Haw, MD

## 2021-07-10 LAB — CANCER ANTIGEN 27.29: CA 27.29: 18.1 U/mL (ref 0.0–38.6)

## 2021-07-16 ENCOUNTER — Other Ambulatory Visit: Payer: Self-pay | Admitting: General Surgery

## 2021-07-16 DIAGNOSIS — Z853 Personal history of malignant neoplasm of breast: Secondary | ICD-10-CM

## 2021-08-04 ENCOUNTER — Other Ambulatory Visit (HOSPITAL_COMMUNITY): Payer: Self-pay

## 2021-08-10 DIAGNOSIS — M25521 Pain in right elbow: Secondary | ICD-10-CM | POA: Diagnosis not present

## 2021-08-10 DIAGNOSIS — M7711 Lateral epicondylitis, right elbow: Secondary | ICD-10-CM | POA: Diagnosis not present

## 2021-08-16 ENCOUNTER — Other Ambulatory Visit (HOSPITAL_COMMUNITY): Payer: Self-pay

## 2021-08-17 ENCOUNTER — Ambulatory Visit
Admission: RE | Admit: 2021-08-17 | Discharge: 2021-08-17 | Disposition: A | Discharge status: Home / Self Care | Payer: Medicare Other | Source: Ambulatory Visit | Attending: General Surgery | Admitting: General Surgery

## 2021-08-17 ENCOUNTER — Other Ambulatory Visit: Payer: Self-pay

## 2021-08-17 DIAGNOSIS — R922 Inconclusive mammogram: Secondary | ICD-10-CM | POA: Diagnosis not present

## 2021-08-17 DIAGNOSIS — Z124 Encounter for screening for malignant neoplasm of cervix: Secondary | ICD-10-CM | POA: Diagnosis not present

## 2021-08-17 DIAGNOSIS — Z01419 Encounter for gynecological examination (general) (routine) without abnormal findings: Secondary | ICD-10-CM | POA: Diagnosis not present

## 2021-08-17 DIAGNOSIS — Z6823 Body mass index (BMI) 23.0-23.9, adult: Secondary | ICD-10-CM | POA: Diagnosis not present

## 2021-08-17 DIAGNOSIS — Z853 Personal history of malignant neoplasm of breast: Secondary | ICD-10-CM

## 2021-08-17 DIAGNOSIS — Z1151 Encounter for screening for human papillomavirus (HPV): Secondary | ICD-10-CM | POA: Diagnosis not present

## 2021-08-19 ENCOUNTER — Encounter: Payer: Self-pay | Admitting: Nurse Practitioner

## 2021-08-24 DIAGNOSIS — M7711 Lateral epicondylitis, right elbow: Secondary | ICD-10-CM | POA: Diagnosis not present

## 2021-08-24 DIAGNOSIS — M25521 Pain in right elbow: Secondary | ICD-10-CM | POA: Diagnosis not present

## 2021-08-30 DIAGNOSIS — M25521 Pain in right elbow: Secondary | ICD-10-CM | POA: Diagnosis not present

## 2021-08-30 DIAGNOSIS — M7711 Lateral epicondylitis, right elbow: Secondary | ICD-10-CM | POA: Diagnosis not present

## 2021-09-02 DIAGNOSIS — M25521 Pain in right elbow: Secondary | ICD-10-CM | POA: Diagnosis not present

## 2021-09-02 DIAGNOSIS — M7711 Lateral epicondylitis, right elbow: Secondary | ICD-10-CM | POA: Diagnosis not present

## 2021-09-07 DIAGNOSIS — M7711 Lateral epicondylitis, right elbow: Secondary | ICD-10-CM | POA: Diagnosis not present

## 2021-09-07 DIAGNOSIS — M25521 Pain in right elbow: Secondary | ICD-10-CM | POA: Diagnosis not present

## 2021-09-09 ENCOUNTER — Other Ambulatory Visit (HOSPITAL_COMMUNITY): Payer: Self-pay

## 2021-09-09 MED FILL — Letrozole Tab 2.5 MG: ORAL | 90 days supply | Qty: 90 | Fill #3 | Status: CN

## 2021-09-10 DIAGNOSIS — M7711 Lateral epicondylitis, right elbow: Secondary | ICD-10-CM | POA: Diagnosis not present

## 2021-09-10 DIAGNOSIS — M25521 Pain in right elbow: Secondary | ICD-10-CM | POA: Diagnosis not present

## 2021-09-13 ENCOUNTER — Other Ambulatory Visit (HOSPITAL_COMMUNITY): Payer: Self-pay

## 2021-09-13 ENCOUNTER — Other Ambulatory Visit: Payer: Self-pay | Admitting: Hematology & Oncology

## 2021-09-13 DIAGNOSIS — C50911 Malignant neoplasm of unspecified site of right female breast: Secondary | ICD-10-CM

## 2021-09-13 DIAGNOSIS — M25521 Pain in right elbow: Secondary | ICD-10-CM | POA: Diagnosis not present

## 2021-09-13 DIAGNOSIS — M7711 Lateral epicondylitis, right elbow: Secondary | ICD-10-CM | POA: Diagnosis not present

## 2021-09-13 MED ORDER — LETROZOLE 2.5 MG PO TABS
ORAL_TABLET | Freq: Every day | ORAL | 6 refills | Status: DC
  Filled 2021-09-13: qty 90, 90d supply, fill #0
  Filled 2021-11-25: qty 90, 90d supply, fill #1
  Filled 2022-02-21: qty 90, 90d supply, fill #2
  Filled 2022-05-17: qty 90, 90d supply, fill #3
  Filled 2022-08-09: qty 90, 90d supply, fill #4

## 2021-09-16 DIAGNOSIS — M25521 Pain in right elbow: Secondary | ICD-10-CM | POA: Diagnosis not present

## 2021-09-16 DIAGNOSIS — M7711 Lateral epicondylitis, right elbow: Secondary | ICD-10-CM | POA: Diagnosis not present

## 2021-09-20 DIAGNOSIS — M25521 Pain in right elbow: Secondary | ICD-10-CM | POA: Diagnosis not present

## 2021-10-04 ENCOUNTER — Other Ambulatory Visit (HOSPITAL_COMMUNITY): Payer: Self-pay

## 2021-10-05 DIAGNOSIS — Z85828 Personal history of other malignant neoplasm of skin: Secondary | ICD-10-CM | POA: Diagnosis not present

## 2021-10-05 DIAGNOSIS — D2372 Other benign neoplasm of skin of left lower limb, including hip: Secondary | ICD-10-CM | POA: Diagnosis not present

## 2021-10-05 DIAGNOSIS — L570 Actinic keratosis: Secondary | ICD-10-CM | POA: Diagnosis not present

## 2021-10-05 DIAGNOSIS — L578 Other skin changes due to chronic exposure to nonionizing radiation: Secondary | ICD-10-CM | POA: Diagnosis not present

## 2021-10-05 DIAGNOSIS — L821 Other seborrheic keratosis: Secondary | ICD-10-CM | POA: Diagnosis not present

## 2021-10-05 DIAGNOSIS — D225 Melanocytic nevi of trunk: Secondary | ICD-10-CM | POA: Diagnosis not present

## 2021-10-12 ENCOUNTER — Other Ambulatory Visit (HOSPITAL_COMMUNITY): Payer: Self-pay

## 2021-10-15 ENCOUNTER — Inpatient Hospital Stay: Payer: Medicare Other | Attending: Hematology & Oncology

## 2021-10-15 VITALS — BP 126/42 | HR 71 | Temp 98.0°F | Resp 17

## 2021-10-15 DIAGNOSIS — C50911 Malignant neoplasm of unspecified site of right female breast: Secondary | ICD-10-CM

## 2021-10-15 DIAGNOSIS — M810 Age-related osteoporosis without current pathological fracture: Secondary | ICD-10-CM | POA: Insufficient documentation

## 2021-10-15 DIAGNOSIS — Z170 Estrogen receptor positive status [ER+]: Secondary | ICD-10-CM | POA: Insufficient documentation

## 2021-10-15 DIAGNOSIS — C50912 Malignant neoplasm of unspecified site of left female breast: Secondary | ICD-10-CM | POA: Insufficient documentation

## 2021-10-15 MED ORDER — DENOSUMAB 60 MG/ML ~~LOC~~ SOSY
60.0000 mg | PREFILLED_SYRINGE | Freq: Once | SUBCUTANEOUS | Status: AC
  Administered 2021-10-15: 60 mg via SUBCUTANEOUS
  Filled 2021-10-15: qty 1

## 2021-10-15 NOTE — Patient Instructions (Signed)
Denosumab injection ?What is this medication? ?DENOSUMAB (den oh sue mab) slows bone breakdown. Prolia is used to treat osteoporosis in women after menopause and in men, and in people who are taking corticosteroids for 6 months or more. Xgeva is used to treat a high calcium level due to cancer and to prevent bone fractures and other bone problems caused by multiple myeloma or cancer bone metastases. Xgeva is also used to treat giant cell tumor of the bone. ?This medicine may be used for other purposes; ask your health care provider or pharmacist if you have questions. ?COMMON BRAND NAME(S): Prolia, XGEVA ?What should I tell my care team before I take this medication? ?They need to know if you have any of these conditions: ?dental disease ?having surgery or tooth extraction ?infection ?kidney disease ?low levels of calcium or Vitamin D in the blood ?malnutrition ?on hemodialysis ?skin conditions or sensitivity ?thyroid or parathyroid disease ?an unusual reaction to denosumab, other medicines, foods, dyes, or preservatives ?pregnant or trying to get pregnant ?breast-feeding ?How should I use this medication? ?This medicine is for injection under the skin. It is given by a health care professional in a hospital or clinic setting. ?A special MedGuide will be given to you before each treatment. Be sure to read this information carefully each time. ?For Prolia, talk to your pediatrician regarding the use of this medicine in children. Special care may be needed. For Xgeva, talk to your pediatrician regarding the use of this medicine in children. While this drug may be prescribed for children as young as 13 years for selected conditions, precautions do apply. ?Overdosage: If you think you have taken too much of this medicine contact a poison control center or emergency room at once. ?NOTE: This medicine is only for you. Do not share this medicine with others. ?What if I miss a dose? ?It is important not to miss your dose.  Call your doctor or health care professional if you are unable to keep an appointment. ?What may interact with this medication? ?Do not take this medicine with any of the following medications: ?other medicines containing denosumab ?This medicine may also interact with the following medications: ?medicines that lower your chance of fighting infection ?steroid medicines like prednisone or cortisone ?This list may not describe all possible interactions. Give your health care provider a list of all the medicines, herbs, non-prescription drugs, or dietary supplements you use. Also tell them if you smoke, drink alcohol, or use illegal drugs. Some items may interact with your medicine. ?What should I watch for while using this medication? ?Visit your doctor or health care professional for regular checks on your progress. Your doctor or health care professional may order blood tests and other tests to see how you are doing. ?Call your doctor or health care professional for advice if you get a fever, chills or sore throat, or other symptoms of a cold or flu. Do not treat yourself. This drug may decrease your body's ability to fight infection. Try to avoid being around people who are sick. ?You should make sure you get enough calcium and vitamin D while you are taking this medicine, unless your doctor tells you not to. Discuss the foods you eat and the vitamins you take with your health care professional. ?See your dentist regularly. Brush and floss your teeth as directed. Before you have any dental work done, tell your dentist you are receiving this medicine. ?Do not become pregnant while taking this medicine or for 5 months after   stopping it. Talk with your doctor or health care professional about your birth control options while taking this medicine. Women should inform their doctor if they wish to become pregnant or think they might be pregnant. There is a potential for serious side effects to an unborn child. Talk to  your health care professional or pharmacist for more information. ?What side effects may I notice from receiving this medication? ?Side effects that you should report to your doctor or health care professional as soon as possible: ?allergic reactions like skin rash, itching or hives, swelling of the face, lips, or tongue ?bone pain ?breathing problems ?dizziness ?jaw pain, especially after dental work ?redness, blistering, peeling of the skin ?signs and symptoms of infection like fever or chills; cough; sore throat; pain or trouble passing urine ?signs of low calcium like fast heartbeat, muscle cramps or muscle pain; pain, tingling, numbness in the hands or feet; seizures ?unusual bleeding or bruising ?unusually weak or tired ?Side effects that usually do not require medical attention (report to your doctor or health care professional if they continue or are bothersome): ?constipation ?diarrhea ?headache ?joint pain ?loss of appetite ?muscle pain ?runny nose ?tiredness ?upset stomach ?This list may not describe all possible side effects. Call your doctor for medical advice about side effects. You may report side effects to FDA at 1-800-FDA-1088. ?Where should I keep my medication? ?This medicine is only given in a clinic, doctor's office, or other health care setting and will not be stored at home. ?NOTE: This sheet is a summary. It may not cover all possible information. If you have questions about this medicine, talk to your doctor, pharmacist, or health care provider. ?? 2023 Elsevier/Gold Standard (2017-10-27 00:00:00) ? ?

## 2021-10-15 NOTE — Progress Notes (Signed)
Okay to use labs from 07/09/21 and give Prolia today per Dr. Marin Olp. ?

## 2021-10-28 ENCOUNTER — Ambulatory Visit (HOSPITAL_COMMUNITY)
Admission: RE | Admit: 2021-10-28 | Discharge: 2021-10-28 | Disposition: A | Discharge status: Home / Self Care | Payer: Medicare Other | Source: Ambulatory Visit | Attending: Hematology & Oncology | Admitting: Hematology & Oncology

## 2021-10-28 DIAGNOSIS — C50919 Malignant neoplasm of unspecified site of unspecified female breast: Secondary | ICD-10-CM | POA: Diagnosis not present

## 2021-10-28 DIAGNOSIS — C785 Secondary malignant neoplasm of large intestine and rectum: Secondary | ICD-10-CM | POA: Diagnosis not present

## 2021-10-28 DIAGNOSIS — C50911 Malignant neoplasm of unspecified site of right female breast: Secondary | ICD-10-CM | POA: Diagnosis not present

## 2021-10-28 DIAGNOSIS — I700 Atherosclerosis of aorta: Secondary | ICD-10-CM | POA: Diagnosis not present

## 2021-10-28 LAB — GLUCOSE, CAPILLARY: Glucose-Capillary: 91 mg/dL (ref 70–99)

## 2021-10-28 MED ORDER — FLUDEOXYGLUCOSE F - 18 (FDG) INJECTION
7.0000 | Freq: Once | INTRAVENOUS | Status: AC
  Administered 2021-10-28: 7 via INTRAVENOUS

## 2021-10-29 ENCOUNTER — Encounter: Payer: Self-pay | Admitting: *Deleted

## 2021-11-01 ENCOUNTER — Other Ambulatory Visit (HOSPITAL_COMMUNITY): Payer: Self-pay

## 2021-11-05 ENCOUNTER — Inpatient Hospital Stay: Payer: Medicare Other

## 2021-11-05 ENCOUNTER — Other Ambulatory Visit (HOSPITAL_COMMUNITY): Payer: Self-pay

## 2021-11-05 ENCOUNTER — Ambulatory Visit: Payer: Medicare Other | Admitting: Hematology & Oncology

## 2021-11-05 ENCOUNTER — Ambulatory Visit: Payer: Medicare Other

## 2021-11-05 DIAGNOSIS — Z23 Encounter for immunization: Secondary | ICD-10-CM | POA: Diagnosis not present

## 2021-11-12 ENCOUNTER — Inpatient Hospital Stay: Payer: Medicare Other | Attending: Hematology & Oncology

## 2021-11-12 ENCOUNTER — Inpatient Hospital Stay (HOSPITAL_BASED_OUTPATIENT_CLINIC_OR_DEPARTMENT_OTHER): Payer: Medicare Other | Admitting: Hematology & Oncology

## 2021-11-12 ENCOUNTER — Other Ambulatory Visit: Payer: Self-pay

## 2021-11-12 ENCOUNTER — Encounter: Payer: Self-pay | Admitting: Hematology & Oncology

## 2021-11-12 VITALS — BP 122/54 | HR 72 | Temp 98.9°F | Resp 18 | Wt 143.0 lb

## 2021-11-12 DIAGNOSIS — C785 Secondary malignant neoplasm of large intestine and rectum: Secondary | ICD-10-CM | POA: Insufficient documentation

## 2021-11-12 DIAGNOSIS — Z9049 Acquired absence of other specified parts of digestive tract: Secondary | ICD-10-CM | POA: Insufficient documentation

## 2021-11-12 DIAGNOSIS — C50912 Malignant neoplasm of unspecified site of left female breast: Secondary | ICD-10-CM | POA: Insufficient documentation

## 2021-11-12 DIAGNOSIS — C50911 Malignant neoplasm of unspecified site of right female breast: Secondary | ICD-10-CM

## 2021-11-12 DIAGNOSIS — Z170 Estrogen receptor positive status [ER+]: Secondary | ICD-10-CM | POA: Insufficient documentation

## 2021-11-12 LAB — CMP (CANCER CENTER ONLY)
ALT: 14 U/L (ref 0–44)
AST: 19 U/L (ref 15–41)
Albumin: 3.9 g/dL (ref 3.5–5.0)
Alkaline Phosphatase: 59 U/L (ref 38–126)
Anion gap: 5 (ref 5–15)
BUN: 17 mg/dL (ref 8–23)
CO2: 27 mmol/L (ref 22–32)
Calcium: 9.4 mg/dL (ref 8.9–10.3)
Chloride: 105 mmol/L (ref 98–111)
Creatinine: 0.95 mg/dL (ref 0.44–1.00)
GFR, Estimated: >60 mL/min (ref >60)
Glucose, Bld: 85 mg/dL (ref 70–99)
Potassium: 4.1 mmol/L (ref 3.5–5.1)
Sodium: 137 mmol/L (ref 135–145)
Total Bilirubin: 0.3 mg/dL (ref 0.3–1.2)
Total Protein: 6.4 g/dL — ABNORMAL LOW (ref 6.5–8.1)

## 2021-11-12 LAB — CBC WITH DIFFERENTIAL (CANCER CENTER ONLY)
Abs Immature Granulocytes: 0.01 10*3/uL (ref 0.00–0.07)
Basophils Absolute: 0.1 10*3/uL (ref 0.0–0.1)
Basophils Relative: 1 %
Eosinophils Absolute: 0.1 10*3/uL (ref 0.0–0.5)
Eosinophils Relative: 1 %
HCT: 35.7 % — ABNORMAL LOW (ref 36.0–46.0)
Hemoglobin: 11.9 g/dL — ABNORMAL LOW (ref 12.0–15.0)
Immature Granulocytes: 0 %
Lymphocytes Relative: 31 %
Lymphs Abs: 1.4 10*3/uL (ref 0.7–4.0)
MCH: 33.3 pg (ref 26.0–34.0)
MCHC: 33.3 g/dL (ref 30.0–36.0)
MCV: 100 fL (ref 80.0–100.0)
Monocytes Absolute: 0.3 10*3/uL (ref 0.1–1.0)
Monocytes Relative: 7 %
Neutro Abs: 2.8 10*3/uL (ref 1.7–7.7)
Neutrophils Relative %: 60 %
Platelet Count: 113 10*3/uL — ABNORMAL LOW (ref 150–400)
RBC: 3.57 MIL/uL — ABNORMAL LOW (ref 3.87–5.11)
RDW: 12.9 % (ref 11.5–15.5)
WBC Count: 4.6 10*3/uL (ref 4.0–10.5)
nRBC: 0 % (ref 0.0–0.2)

## 2021-11-12 LAB — LACTATE DEHYDROGENASE: LDH: 111 U/L (ref 98–192)

## 2021-11-12 NOTE — Progress Notes (Signed)
?Hematology and Oncology Follow Up Visit ? ?Martha Osborne ?[id number] ?06-21-1951 71 y.o. ?11/12/2021 ? ? 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 ?Current Therapy:   ?Status post partial colectomy on 09/14/2018 ?Letrozole 2.5 mg p.o. daily ?Ribociclib 600 mg p.o. daily (21/7) -- d/c due to skin rash ?      Verzenio 150 mg po day - started on 06/12/2019  ?      Prolia 60 mg IM q. 6 months --next dose in 04/2022 ? ?  ?Interim History:  Martha Osborne is back for follow-up.  She is not feeling all that well.  She just has not felt all that well lately.  She and her husband are headed down to [place] to see their son and daughter-in-law next week.  In June, there will be going out to [place]. ? ?They just got back from [place].  There is a little bit of over union on her husband's side of the family. ? ?She thinks that is the letrozole has caused her not to feel good.  I told her that she could stop the letrozole for a month.  I would have no problems with this.  If she feels better, then we can switch her over to something different. ? ?She wants to hold off on doing this until after she gets back from [place]. ? ?We did do a PET scan on her.  The PET scan was done last week.  The PET scan showed that there is no evidence of disease. ? ?Her last CA 27.29 back in January was only 18. ? ?She has had no problems with bowels or bladder.  She has had no rashes.  There is been no leg swelling.  She has had no issues with cough or shortness of breath.  She is worried about the atherosclerosis that is seen on her scans.  I told her that this is not a real surprise.  At her age, this is certainly common.  I think that what is important is that her cholesterol/HDL ratio was only 2.9.  I tried to encourage her about this. ? ?She has had no fever. ? ?Overall, I would say performance status is probably ECOG 1.  .   ? ?Medications:   ?Current Outpatient Medications:  ?  abemaciclib (VERZENIO) 150 MG tablet, TAKE 1 TABLET (150 MG TOTAL) BY MOUTH DAILY., Disp: 28 tablet, Rfl: 6 ?  aspirin 81 MG EC tablet, Take 1 tablet (81 mg total) by mouth daily. Swallow whole., Disp: 30 tablet, Rfl: 12 ?  Carboxymeth-Glycerin-Polysorb (REFRESH OPTIVE ADVANCED) 0.5-1-0.5 % SOLN, Apply 1 drop to eye daily as needed (Both eyes)., Disp: , Rfl:  ?  Cholecalciferol (VITAMIN D3 PO), Take 2,000 Units by mouth daily., Disp: , Rfl:  ?  denosumab (PROLIA) 60 MG/ML SOSY injection, Inject 60 mg into the skin every 6 (six) months., Disp: , Rfl:  ?  letrozole (FEMARA) 2.5 MG tablet, TAKE 1 TABLET BY MOUTH ONCE A DAY, Disp: 90 tablet, Rfl: 6 ?  loperamide (IMODIUM) 2 MG capsule, Take 2 mg by mouth as needed., Disp: , Rfl:  ?  Psyllium (METAMUCIL) 48.57 % POWD, 1 packet with 8 ounces of liquid as needed, Disp: , Rfl:  ? ?Allergies:  ?Allergies  ?Allergen Reactions  ? Kisqali Femara (200 Mg Dose) [Ribociclib-Letrozole (400 Mg)] Rash  ? Kisqali (200 Mg Dose) [Ribociclib Succ (200 Mg Dose)] Rash and Other (See Comments)  ?  Itchy, scratchy throat.  ? ? ?Past Medical History, Surgical history, Social history, and Family History were reviewed and updated. ? ?Review of Systems: ?Review of Systems  ?Constitutional: Negative.   ?HENT:  Negative.   ?Eyes: Negative.   ?Respiratory: Negative.   ?Cardiovascular: Negative.   ?Gastrointestinal: Positive for abdominal pain.  ?Endocrine: Negative.   ?Genitourinary: Negative.    ?Musculoskeletal: Negative.   ?Skin: Negative.   ?Neurological: Negative.   ?Hematological: Negative.   ?Psychiatric/Behavioral: Negative.   ? ? ?Physical Exam: ? weight is 143 lb (64.9 kg). Her oral temperature is 98.9 ?F (37.2 ?C). Her blood pressure is 122/54 (abnormal) and her pulse is 72. Her respiration is 18 and oxygen saturation is 100%.  ? ?Wt Readings from Last 3 Encounters:  ?11/12/21 143 lb (64.9 kg)  ?07/09/21 142 lb (64.4 kg)  ?06/21/21 142 lb 12.8 oz  (64.8 kg)  ? ? ?Physical Exam ?Vitals signs reviewed.  ?Constitutional:   ?   Comments: Breast exam bilaterally shows no breast masses.  She has no nipple discharge bilaterally.  She has no breast swelling or erythema.  There is no bilateral axillary adenopathy.  ?HENT:  ?   Head: Normocephalic and atraumatic.  ?Eyes:  ?   Pupils: Pupils are equal, round, and reactive to light.  ?Neck:  ?   Musculoskeletal: Normal range of motion.  ?Cardiovascular:  ?   Rate and Rhythm: Normal rate and regular rhythm.  ?   Heart sounds: Normal heart sounds.  ?Pulmonary:  ?   Effort: Pulmonary effort is normal.  ?   Breath sounds: Normal breath sounds.  ?Abdominal:  ?   General: Bowel sounds are normal.  ?   Palpations: Abdomen is soft.  ?   Comments: Abdominal exam shows the healing laparotomy scar.  She has no swelling.  There is no erythema about the surgical site.  She has been tenderness to palpation in the right lower quadrant.  No masses noted.  Bowel sounds are present.  There is no palpable liver or spleen tip.  ?Musculoskeletal: Normal range of motion.     ?   General: No tenderness or deformity.  ?Lymphadenopathy:  ?   Cervical: No cervical adenopathy.  ?Skin: ?   General: Skin is warm and dry.  ?   Findings: No erythema or rash.  ?Neurological:  ?   Mental Status: She is alert and oriented to person, place, and time.  ?Psychiatric:     ?   Behavior: Behavior normal.     ?   Thought Content: Thought content normal.     ?   Judgment: Judgment normal.  ? ? ? ? ?Lab Results  ?Component Value Date  ? WBC 4.6 11/12/2021  ? HGB 11.9 (L) 11/12/2021  ? HCT 35.7 (L) 11/12/2021  ? MCV 100.0 11/12/2021  ? PLT 113 (L) 11/12/2021  ? ?  Chemistry   ?   ?Component Value Date/Time  ? NA 137 11/12/2021 1027  ? K 4.1 11/12/2021 1027  ? CL 105 11/12/2021 1027  ? CO2 27 11/12/2021 1027  ? BUN 17 11/12/2021 1027  ? CREATININE 0.95 11/12/2021 1027  ?    ?Component Value Date/Time  ? CALCIUM 9.4 11/12/2021 1027  ? ALKPHOS 59 11/12/2021 1027  ?  AST 19 11/12/2021 1027  ? ALT 14 11/12/2021 1027  ? BILITOT 0.3 11/12/2021 1027  ?  ? ? ? ?Impression and Plan: ?Ms. Pettitt is a 71 year old postmenopausal female.  She has metastatic lobular carcinoma  of the left breast.  We finally found her primary after doing a breast MRI. ? ?She had resection of the colonic mass.  She had multiple positive lymph nodes.   ? ?For now, I really do not think we have to do any scans on her until the Fall.  I think we get her through the Summer. ? ?She got her Prolia back in April.  We will get this set up for when we see her back. ? ?I really am impressed with how well she is done.  I am just very happy for her. ? ?Again, she will stop the letrozole after she gets back from [place].  She will let us know if she feels better.  If she does, then maybe we will switch her over to Aromasin. ? ?I will plan to see her back after Labor Day. ? ? ?[name], MD ?

## 2021-11-13 LAB — CANCER ANTIGEN 27.29: CA 27.29: 20.4 U/mL (ref 0.0–38.6)

## 2021-11-25 ENCOUNTER — Other Ambulatory Visit (HOSPITAL_COMMUNITY): Payer: Self-pay

## 2021-12-02 ENCOUNTER — Other Ambulatory Visit (HOSPITAL_COMMUNITY): Payer: Self-pay

## 2021-12-20 ENCOUNTER — Ambulatory Visit: Payer: Medicare Other | Admitting: Nurse Practitioner

## 2021-12-24 ENCOUNTER — Ambulatory Visit (INDEPENDENT_AMBULATORY_CARE_PROVIDER_SITE_OTHER): Payer: Medicare Other | Admitting: Nurse Practitioner

## 2021-12-24 ENCOUNTER — Encounter: Payer: Self-pay | Admitting: Nurse Practitioner

## 2021-12-24 VITALS — BP 122/72 | HR 69 | Temp 97.3°F | Ht 66.0 in | Wt 142.8 lb

## 2021-12-24 DIAGNOSIS — I700 Atherosclerosis of aorta: Secondary | ICD-10-CM | POA: Diagnosis not present

## 2021-12-24 DIAGNOSIS — C785 Secondary malignant neoplasm of large intestine and rectum: Secondary | ICD-10-CM | POA: Diagnosis not present

## 2021-12-24 DIAGNOSIS — M810 Age-related osteoporosis without current pathological fracture: Secondary | ICD-10-CM | POA: Diagnosis not present

## 2021-12-24 DIAGNOSIS — C50911 Malignant neoplasm of unspecified site of right female breast: Secondary | ICD-10-CM | POA: Diagnosis not present

## 2021-12-24 DIAGNOSIS — M1712 Unilateral primary osteoarthritis, left knee: Secondary | ICD-10-CM

## 2021-12-24 DIAGNOSIS — J302 Other seasonal allergic rhinitis: Secondary | ICD-10-CM | POA: Diagnosis not present

## 2021-12-24 DIAGNOSIS — M7711 Lateral epicondylitis, right elbow: Secondary | ICD-10-CM | POA: Diagnosis not present

## 2021-12-29 ENCOUNTER — Other Ambulatory Visit (HOSPITAL_COMMUNITY): Payer: Self-pay

## 2021-12-29 ENCOUNTER — Other Ambulatory Visit: Payer: Self-pay | Admitting: Hematology & Oncology

## 2021-12-29 DIAGNOSIS — C50911 Malignant neoplasm of unspecified site of right female breast: Secondary | ICD-10-CM

## 2021-12-29 MED ORDER — ABEMACICLIB 150 MG PO TABS
ORAL_TABLET | Freq: Every day | ORAL | 6 refills | Status: DC
  Filled 2021-12-29: qty 28, 28d supply, fill #0
  Filled 2022-01-24: qty 28, 28d supply, fill #1
  Filled 2022-02-21: qty 28, 28d supply, fill #2

## 2021-12-31 ENCOUNTER — Other Ambulatory Visit (HOSPITAL_COMMUNITY): Payer: Self-pay

## 2022-01-24 ENCOUNTER — Other Ambulatory Visit (HOSPITAL_COMMUNITY): Payer: Self-pay

## 2022-01-27 ENCOUNTER — Other Ambulatory Visit (HOSPITAL_COMMUNITY): Payer: Self-pay

## 2022-02-21 ENCOUNTER — Other Ambulatory Visit (HOSPITAL_COMMUNITY): Payer: Self-pay

## 2022-02-28 ENCOUNTER — Other Ambulatory Visit (HOSPITAL_COMMUNITY): Payer: Self-pay

## 2022-03-01 ENCOUNTER — Encounter: Payer: Self-pay | Admitting: Hematology & Oncology

## 2022-03-17 ENCOUNTER — Ambulatory Visit: Payer: Medicare Other

## 2022-03-17 ENCOUNTER — Other Ambulatory Visit: Payer: Self-pay

## 2022-03-17 ENCOUNTER — Inpatient Hospital Stay: Payer: Medicare Other | Attending: Hematology & Oncology

## 2022-03-17 ENCOUNTER — Other Ambulatory Visit (HOSPITAL_COMMUNITY): Payer: Self-pay

## 2022-03-17 ENCOUNTER — Encounter: Payer: Self-pay | Admitting: Hematology & Oncology

## 2022-03-17 ENCOUNTER — Inpatient Hospital Stay (HOSPITAL_BASED_OUTPATIENT_CLINIC_OR_DEPARTMENT_OTHER): Payer: Medicare Other | Admitting: Hematology & Oncology

## 2022-03-17 DIAGNOSIS — Z170 Estrogen receptor positive status [ER+]: Secondary | ICD-10-CM | POA: Insufficient documentation

## 2022-03-17 DIAGNOSIS — Z79811 Long term (current) use of aromatase inhibitors: Secondary | ICD-10-CM | POA: Diagnosis not present

## 2022-03-17 DIAGNOSIS — C50912 Malignant neoplasm of unspecified site of left female breast: Secondary | ICD-10-CM | POA: Diagnosis not present

## 2022-03-17 DIAGNOSIS — Z9049 Acquired absence of other specified parts of digestive tract: Secondary | ICD-10-CM | POA: Diagnosis not present

## 2022-03-17 DIAGNOSIS — C785 Secondary malignant neoplasm of large intestine and rectum: Secondary | ICD-10-CM

## 2022-03-17 DIAGNOSIS — C50911 Malignant neoplasm of unspecified site of right female breast: Secondary | ICD-10-CM

## 2022-03-17 LAB — CBC WITH DIFFERENTIAL (CANCER CENTER ONLY)
Abs Immature Granulocytes: 0.01 10*3/uL (ref 0.00–0.07)
Basophils Absolute: 0 10*3/uL (ref 0.0–0.1)
Basophils Relative: 1 %
Eosinophils Absolute: 0 10*3/uL (ref 0.0–0.5)
Eosinophils Relative: 1 %
HCT: 36.6 % (ref 36.0–46.0)
Hemoglobin: 12.3 g/dL (ref 12.0–15.0)
Immature Granulocytes: 0 %
Lymphocytes Relative: 41 %
Lymphs Abs: 1.5 10*3/uL (ref 0.7–4.0)
MCH: 33.1 pg (ref 26.0–34.0)
MCHC: 33.6 g/dL (ref 30.0–36.0)
MCV: 98.4 fL (ref 80.0–100.0)
Monocytes Absolute: 0.3 10*3/uL (ref 0.1–1.0)
Monocytes Relative: 7 %
Neutro Abs: 1.9 10*3/uL (ref 1.7–7.7)
Neutrophils Relative %: 50 %
Platelet Count: 85 10*3/uL — ABNORMAL LOW (ref 150–400)
RBC: 3.72 MIL/uL — ABNORMAL LOW (ref 3.87–5.11)
RDW: 12.9 % (ref 11.5–15.5)
WBC Count: 3.7 10*3/uL — ABNORMAL LOW (ref 4.0–10.5)
nRBC: 0 % (ref 0.0–0.2)

## 2022-03-17 LAB — CMP (CANCER CENTER ONLY)
ALT: 11 U/L (ref 0–44)
AST: 17 U/L (ref 15–41)
Albumin: 4 g/dL (ref 3.5–5.0)
Alkaline Phosphatase: 53 U/L (ref 38–126)
Anion gap: 6 (ref 5–15)
BUN: 18 mg/dL (ref 8–23)
CO2: 30 mmol/L (ref 22–32)
Calcium: 10 mg/dL (ref 8.9–10.3)
Chloride: 101 mmol/L (ref 98–111)
Creatinine: 1.17 mg/dL — ABNORMAL HIGH (ref 0.44–1.00)
GFR, Estimated: 50 mL/min — ABNORMAL LOW (ref >60)
Glucose, Bld: 106 mg/dL — ABNORMAL HIGH (ref 70–99)
Potassium: 4.5 mmol/L (ref 3.5–5.1)
Sodium: 137 mmol/L (ref 135–145)
Total Bilirubin: 0.4 mg/dL (ref 0.3–1.2)
Total Protein: 7.1 g/dL (ref 6.5–8.1)

## 2022-03-17 LAB — LACTATE DEHYDROGENASE: LDH: 100 U/L (ref 98–192)

## 2022-03-17 MED ORDER — ABEMACICLIB 100 MG PO TABS
100.0000 mg | ORAL_TABLET | Freq: Every day | ORAL | 6 refills | Status: DC
  Filled 2022-03-17: qty 42, 42d supply, fill #0
  Filled 2022-03-24: qty 28, 28d supply, fill #0
  Filled 2022-04-19: qty 28, 28d supply, fill #1
  Filled 2022-05-17: qty 28, 28d supply, fill #2
  Filled 2022-06-14 – 2022-06-15 (×2): qty 28, 28d supply, fill #3
  Filled 2022-06-30: qty 28, 28d supply, fill #4
  Filled 2022-08-09: qty 28, 28d supply, fill #5
  Filled 2022-09-06: qty 28, 28d supply, fill #6

## 2022-03-17 NOTE — Progress Notes (Signed)
Hematology and Oncology Follow Up Visit  WAKISHA ALBERTS [id number] 05/30/1951 71 y.o. 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 daily Ribociclib 600 mg p.o. daily (21/7) -- d/c due to skin rash       Verzenio 100 mg po day - started on 06/12/2019 -changed on 03/17/2022        Prolia 60 mg IM q. 6 months --next dose in 06/2022    Interim History:  Ms. Hotard is back for follow-up.  She is doing a little bit better.  She had a very nice time in [place].  She and her husband are going be going up to [place].  They will stop off in [place] to see some family.  She thinks had the baby aspirin that she was taking was causing a lot of problems for her.  She stopped the baby aspirin and her stomach got better.  She got COVID.  She got COVID a few weeks ago.  She still is trying to recover from some of the side effects.  Hopefully, she will not have "long COVID."  She wants to try to cut back the dose of Verzenio.  She thinks that the 150 mg dose might be little bit too much for her.  We t we will try her on 100 mg dose and see how she does.  Her last CA 27.29 was holding steady at 20.  She has had no bleeding.  There is been no obvious change in bowel or bladder habits.  She has had no rashes.  Is been no leg swelling.  Overall, I would say performance status is probably ECOG 1.  Medications:  Current Outpatient Medications:    abemaciclib (VERZENIO) 150 MG tablet, TAKE 1 TABLET (150 MG TOTAL) BY MOUTH DAILY., Disp: 28 tablet, Rfl: 6   Carboxymeth-Glycerin-Polysorb (REFRESH OPTIVE ADVANCED) 0.5-1-0.5 % SOLN, Apply 1 drop to eye daily as needed (Both eyes)., Disp: , Rfl:    Cholecalciferol (VITAMIN D3 PO), Take 2,000 Units by mouth daily., Disp: , Rfl:    denosumab (PROLIA) 60 MG/ML SOSY injection,  Inject 60 mg into the skin every 6 (six) months., Disp: , Rfl:    letrozole (FEMARA) 2.5 MG tablet, TAKE 1 TABLET BY MOUTH ONCE A DAY, Disp: 90 tablet, Rfl: 6   loperamide (IMODIUM) 2 MG capsule, Take 2 mg by mouth as needed., Disp: , Rfl:    Psyllium (METAMUCIL) 48.57 % POWD, 1 packet with 8 ounces of liquid as needed, Disp: , Rfl:   Allergies:  Allergies  Allergen Reactions   Kisqali (200 Mg Dose) [Ribociclib Succ (200 Mg Dose)] Itching and Rash    scratchy throat.    Past Medical History, Surgical history, Social history, and Family History were reviewed and updated.  Review of Systems: Review of Systems  Constitutional: Negative.   HENT:  Negative.   Eyes: Negative.   Respiratory: Negative.   Cardiovascular: Negative.   Gastrointestinal: Positive for abdominal pain.  Endocrine: Negative.   Genitourinary: Negative.    Musculoskeletal: Negative.   Skin: Negative.   Neurological: Negative.   Hematological: Negative.   Psychiatric/Behavioral: Negative.     Physical Exam:  height is _0  (1.676 m) and weight is 139 lb (63 kg). Her oral temperature is 98.2 F (36.8 C). Her blood pressure is 105/39 (abnormal) and her pulse  is 68. Her respiration is 18 and oxygen saturation is 100%.   Wt Readings from Last 3 Encounters:  03/17/22 139 lb (63 kg)  12/24/21 142 lb 12.8 oz (64.8 kg)  11/12/21 143 lb (64.9 kg)    Physical Exam Vitals signs reviewed.  Constitutional:      Comments: Breast exam bilaterally shows no breast masses.  She has no nipple discharge bilaterally.  She has no breast swelling or erythema.  There is no bilateral axillary adenopathy.  HENT:     Head: Normocephalic and atraumatic.  Eyes:     Pupils: Pupils are equal, round, and reactive to light.  Neck:     Musculoskeletal: Normal range of motion.  Cardiovascular:     Rate and Rhythm: Normal rate and regular rhythm.     Heart sounds: Normal heart sounds.  Pulmonary:     Effort: Pulmonary effort is  normal.     Breath sounds: Normal breath sounds.  Abdominal:     General: Bowel sounds are normal.     Palpations: Abdomen is soft.     Comments: Abdominal exam shows the healing laparotomy scar.  She has no swelling.  There is no erythema about the surgical site.  She has been tenderness to palpation in the right lower quadrant.  No masses noted.  Bowel sounds are present.  There is no palpable liver or spleen tip.  Musculoskeletal: Normal range of motion.        General: No tenderness or deformity.  Lymphadenopathy:     Cervical: No cervical adenopathy.  Skin:    General: Skin is warm and dry.     Findings: No erythema or rash.  Neurological:     Mental Status: She is alert and oriented to person, place, and time.  Psychiatric:        Behavior: Behavior normal.        Thought Content: Thought content normal.        Judgment: Judgment normal.      Lab Results  Component Value Date   WBC 3.7 (L) 03/17/2022   HGB 12.3 03/17/2022   HCT 36.6 03/17/2022   MCV 98.4 03/17/2022   PLT 85 (L) 03/17/2022     Chemistry      Component Value Date/Time   NA 137 03/17/2022 1020   K 4.5 03/17/2022 1020   CL 101 03/17/2022 1020   CO2 30 03/17/2022 1020   BUN 18 03/17/2022 1020   CREATININE 1.17 (H) 03/17/2022 1020      Component Value Date/Time   CALCIUM 10.0 03/17/2022 1020   ALKPHOS 53 03/17/2022 1020   AST 17 03/17/2022 1020   ALT 11 03/17/2022 1020   BILITOT 0.4 03/17/2022 1020       Impression and Plan: Ms. Sumler is a 72 year old postmenopausal female.  She has metastatic lobular carcinoma of the left breast.  We finally found her primary after doing a breast MRI.  She had resection of the colonic mass.  She had multiple positive lymph nodes.    I just want her quality life to be a little better.  Maybe, decreasing the Verzenio dose will help.  Again, hopefully she will not have problems with COVID.  I will hold off on Prolia.  We will get the Prolia into her when we see  her back in December.  I know she is quite sensitive to medications.  We will continue to make some adjustments.  I think we have some "flexibility" chest because she does  not have a lot of disease.  In fact, when we last did her PET scan, there was no obvious metastatic disease.  I will plan to get her back in December.  We will do Prolia then.   [name], MD

## 2022-03-18 LAB — CANCER ANTIGEN 27.29: CA 27.29: 25 U/mL (ref 0.0–38.6)

## 2022-03-19 ENCOUNTER — Encounter: Payer: Self-pay | Admitting: Hematology & Oncology

## 2022-03-24 ENCOUNTER — Other Ambulatory Visit (HOSPITAL_COMMUNITY): Payer: Self-pay

## 2022-03-28 ENCOUNTER — Other Ambulatory Visit (HOSPITAL_COMMUNITY): Payer: Self-pay

## 2022-04-04 ENCOUNTER — Other Ambulatory Visit: Payer: Self-pay | Admitting: General Surgery

## 2022-04-04 DIAGNOSIS — C50312 Malignant neoplasm of lower-inner quadrant of left female breast: Secondary | ICD-10-CM

## 2022-04-04 DIAGNOSIS — Z853 Personal history of malignant neoplasm of breast: Secondary | ICD-10-CM

## 2022-04-04 DIAGNOSIS — C50911 Malignant neoplasm of unspecified site of right female breast: Secondary | ICD-10-CM

## 2022-04-05 ENCOUNTER — Encounter: Payer: Self-pay | Admitting: Nurse Practitioner

## 2022-04-05 ENCOUNTER — Ambulatory Visit (INDEPENDENT_AMBULATORY_CARE_PROVIDER_SITE_OTHER): Payer: Medicare Other | Admitting: Nurse Practitioner

## 2022-04-05 DIAGNOSIS — Z0000 Encounter for general adult medical examination without abnormal findings: Secondary | ICD-10-CM

## 2022-04-05 NOTE — Progress Notes (Signed)
Subjective:   Martha Osborne is a 71 y.o. female who presents for Medicare Annual (Subsequent) preventive examination.  Review of Systems     Cardiac Risk Factors include: advanced age (>76mn, >>44women)     Objective:    Today's Vitals   04/05/22 0841  PainSc: 0-No pain   There is no height or weight on file to calculate BMI.     04/05/2022    8:45 AM 03/17/2022   11:13 AM 11/12/2021   11:03 AM 07/09/2021   12:08 PM 03/30/2021    8:19 AM 03/23/2021   12:32 PM 02/19/2021    1:25 PM  Advanced Directives  Does Patient Have a Medical Advance Directive? Yes Yes Yes Yes Yes Yes Yes  Type of Advance Directive   Living will;Healthcare Power of Attorney Living will;Healthcare Power of [place] Does patient want to make changes to medical advance directive? No - Patient declined  No - Patient declined No - Patient declined No - Patient declined No - Patient declined No - Patient declined  Copy of [place] Chart? Yes - validated most recent copy scanned in chart (See row information) Yes - validated most recent copy scanned in chart (See row information)   Yes - validated most recent copy scanned in chart (See row information) No - copy requested Yes - validated most recent copy scanned in chart (See row information)  Would patient like information on creating a medical advance directive?      No - Patient declined     Current Medications (verified) Outpatient Encounter Medications as of 04/05/2022  Medication Sig   abemaciclib (VERZENIO) 100 MG tablet Take 1 tablet (100 mg total) by mouth daily.   Carboxymeth-Glycerin-Polysorb (REFRESH OPTIVE ADVANCED) 0.5-1-0.5 % SOLN Apply 1 drop to eye daily as needed (Both eyes).   Cholecalciferol (VITAMIN D3 PO) Take 2,000 Units by mouth daily.   denosumab (PROLIA) 60 MG/ML SOSY injection Inject 60 mg into the skin every 6 (six)  months.   letrozole (FEMARA) 2.5 MG tablet TAKE 1 TABLET BY MOUTH ONCE A DAY   loperamide (IMODIUM) 2 MG capsule Take 2 mg by mouth as needed.   Psyllium (METAMUCIL) 48.57 % POWD 1 packet with 8 ounces of liquid as needed   No facility-administered encounter medications on file as of 04/05/2022.    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, right [place]  Complication of anesthesia    very sensitive to meds   Goals of care, counseling/discussion 08/30/2018   History of bone density study 09/29/2020   History of colonoscopy 11/24/2020   History of EKG 10/10/2018   History of mammogram 01/17/2020   History of MRI    Breast (03/12/2020), Brain (06/13/2019) and Knee (02/23/2018& 05/22/2018)   Osteoporosis    Personal history of chemotherapy    Shingles    Skin cancer    basal cell x 2 (on back and on nose)   Tinnitus of left ear    Past Surgical History:  Procedure Laterality Date   bone cyst removed Right 1978   BREAST BIOPSY Left 08/28/2018   COLON RESECTION Right 09/14/2018   Procedure: LAPAROSCOPIC  ASSISTED RIGHT COLECTOMY;  Surgeon: IFanny Skates MD;  [facility]  Service: General;  Laterality: Right;  COLONOSCOPY     LAPAROSCOPIC RIGHT COLECTOMY  09/14/2018   Family History  Problem Relation Age of Onset   Dementia Mother    Congestive Heart Failure Father    Early death Paternal Aunt    Dementia Maternal Grandmother    Breast cancer Neg Hx    Social History   Socioeconomic History   Marital status: Married    Spouse name: Not on file   Number of children: Not on file   Years of education: Not on file   Highest education level: Not on file  Occupational History   Not on file  Tobacco Use   Smoking status: Never   Smokeless tobacco: Never  Vaping Use   Vaping Use: Never used  Substance and Sexual Activity    Alcohol use: Yes    Comment: very rarely   Drug use: Never   Sexual activity: Not on file  Other Topics Concern   Not on file  Social History Narrative   Diet: Omnivore      Caffeine: No      Married, if yes what year: Married/1975      Do you live in a house, apartment, assisted living, condo, trailer, ect: Rental townhouse      Is it one or more stories: 2      How many persons live in your home? 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 No   Do you have difficulty preparing food or eating? No   Do you have difficulty managing your medications? No   Do you have difficulty managing your finances? No   Do you have difficulty affording your medications? No   Social Determinants of Radio broadcast assistant Strain: Not on file  Food Insecurity: Not on file  Transportation Needs: Not on file  Physical Activity: Not on file  Stress: Not on file  Social Connections: Not on file    Tobacco Counseling Counseling given: Not Answered   Clinical Intake:  Pre-visit preparation completed: Yes  Pain : No/denies pain Pain Score: 0-No pain     BMI - recorded: 23.05 Nutritional Status: BMI of 19-24  Normal Nutritional Risks: None Diabetes: No  How often do you need to have someone help you when you read instructions, pamphlets, or other written materials from your doctor or pharmacy?: 1 - Never What is the last grade level you completed in school?: [place]  Interpreter Needed?: No  Information entered by :: Porsha McClurkin,CMA   Activities of Daily Living    04/05/2022    8:47 AM  In  your present state of health, do you have any difficulty performing the following activities:  Hearing? 0  Vision? 0  Difficulty concentrating or making decisions? 0  Walking or climbing stairs? 0  Dressing or bathing? 0  Doing errands, shopping? 0  Preparing Food and eating ? N  Using the Toilet? N  In the past six months, have you accidently leaked urine? Y  Do you have problems with loss of bowel control? N  Managing your Medications? N  Managing your Finances? N  Housekeeping or managing your Housekeeping? N    Patient Care Team: Lauree Chandler, NP as PCP - General (Geriatric Medicine) Volanda Napoleon, MD as Medical Oncologist (Oncology) Stark Klein, MD as Consulting Physician (General Surgery) Jari Pigg, MD as Consulting Physician (Dermatology) Dian Queen, MD as Consulting Physician (Obstetrics and Gynecology)  Indicate any recent Medical Services you may have received from other than [facility] providers in the past year (date may be approximate).     Assessment:   This is a routine wellness examination for [facility].  Hearing/Vision screen Hearing Screening - Comments:: No hearing concerns/ hearing aids Vision Screening - Comments:: No concerns/ wear glasses  Dietary issues and exercise activities discussed: Current Exercise Habits: Home exercise routine, Type of exercise: walking;strength training/weights, Time (Minutes): 40, Frequency (Times/Week): 7, Weekly Exercise (Minutes/Week): 280, Intensity: Mild   Goals Addressed   None    Depression Screen    04/05/2022    8:47 AM 03/30/2021    8:18 AM 02/19/2021    1:25 PM  PHQ 2/9 Scores  PHQ - 2 Score 0 0 0    Fall Risk    04/05/2022    8:47 AM 12/24/2021    1:32 PM 06/21/2021    1:02 PM 03/30/2021    8:17 AM 02/19/2021    1:24 PM  Fall Risk   Falls in the past year? 0 0 0 1 1  Number falls in past yr: 0 0 0 0 0  Injury with Fall? 0 0 0 0 0  Risk for fall due to : No Fall Risks No Fall Risks No Fall  Risks No Fall Risks No Fall Risks  Follow up  Falls evaluation completed Falls evaluation completed Falls evaluation completed Falls evaluation completed    FALL RISK PREVENTION PERTAINING TO THE HOME:  Any stairs in or around the home? Yes  If so, are there any without handrails? No  Home free of loose throw rugs in walkways, pet beds, electrical cords, etc? Yes  Adequate lighting in your home to reduce risk of falls? Yes   ASSISTIVE DEVICES UTILIZED TO PREVENT FALLS:  Life alert? No  Use of a cane, walker or w/c? No  Grab bars in the bathroom? No  Shower chair or bench in shower? No  Elevated toilet seat or a handicapped toilet? No   TIMED UP AND GO:  Was the test performed? No .    Cognitive Function:        04/05/2022    8:50 AM 03/30/2021    8:20 AM  6CIT Screen  What Year? 0 points 0 points  What month? 0 points 0 points  What time? 0 points 0 points  Count back from 20 0 points 0 points  Months in reverse 0 points 0 points  Repeat phrase 4 points 0 points  Total Score 4 points 0 points    Immunizations Immunization History  Administered Date(s) Administered   Influenza, High Dose Seasonal PF 05/01/2019, 05/07/2021   Influenza, Quadrivalent, Recombinant, Inj, Pf 04/19/2018   Influenza-Unspecified 06/04/2018, 05/01/2019, 04/23/2020   Moderna SARS-COV2 Booster Vaccination 01/23/2021   Moderna Sars-Covid-2 Vaccination 08/15/2019, 09/13/2019, 05/15/2020   Pfizer Covid-19 Vaccine Bivalent Booster 41yr & up 04/16/2021   Pneumococcal Conjugate-13 04/28/2017   Pneumococcal Polysaccharide-23 06/20/2018   Tdap 06/21/2011   Zoster  Recombinat (Shingrix) 03/22/2012    TDAP status: Due, Education has been provided regarding the importance of this vaccine. Advised may receive this vaccine at local pharmacy or Health Dept. Aware to provide a copy of the vaccination record if obtained from local pharmacy or Health Dept. Verbalized acceptance and understanding.  Flu  Vaccine status: Due, Education has been provided regarding the importance of this vaccine. Advised may receive this vaccine at local pharmacy or Health Dept. Aware to provide a copy of the vaccination record if obtained from local pharmacy or Health Dept. Verbalized acceptance and understanding.  Pneumococcal vaccine status: Up to date  Covid-19 vaccine status: Information provided on how to obtain vaccines.   Qualifies for Shingles Vaccine? Yes   Zostavax completed No   Shingrix Completed?: No.    Education has been provided regarding the importance of this vaccine. Patient has been advised to call insurance company to determine out of pocket expense if they have not yet received this vaccine. Advised may also receive vaccine at local pharmacy or Health Dept. Verbalized acceptance and understanding.  Screening Tests Health Maintenance  Topic Date Due   Zoster Vaccines- Shingrix (2 of 2) 05/17/2012   COVID-19 Vaccine (5 - Moderna risk series) 06/11/2021   INFLUENZA VACCINE  02/01/2022   TETANUS/TDAP  12/25/2022 (Originally 06/20/2021)   MAMMOGRAM  08/18/2023   COLONOSCOPY (Pts 45-70yr Insurance coverage will need to be confirmed)  11/18/2023   Pneumonia Vaccine 71 Years old  Completed   DEXA SCAN  Completed   Hepatitis C Screening  Completed   HPV VACCINES  Aged Out    Health Maintenance  Health Maintenance Due  Topic Date Due   Zoster Vaccines- Shingrix (2 of 2) 05/17/2012   COVID-19 Vaccine (5 - Moderna risk series) 06/11/2021   INFLUENZA VACCINE  02/01/2022    Colorectal cancer screening: Type of screening: Colonoscopy. Completed 11/17/20. Repeat every 3 years  Mammogram status: Completed 08/17/21. Repeat every year  Bone Density status: Completed 09/29/21. Results reflect: Bone density results: OSTEOPOROSIS. Repeat every 2 years.  Lung Cancer Screening: (Low Dose CT Chest recommended if Age 71-80years, 30 pack-year currently smoking OR have quit w/in 15years.) does not  qualify.   Lung Cancer Screening Referral: b=na  Additional Screening:  Hepatitis C Screening: does qualify; Completed 06/21/21  Vision Screening: Recommended annual ophthalmology exams for early detection of glaucoma and other disorders of the eye. Is the patient up to date with their annual eye exam?  Yes  Who is the provider or what is the name of the office in which the patient attends annual eye exams? [facility] If pt is not established with a provider, would they like to be referred to a provider to establish care? No .   Dental Screening: Recommended annual dental exams for proper oral hygiene  Community Resource Referral / Chronic Care Management: CRR required this visit?  No   CCM required this visit?  No      Plan:     I have personally reviewed and noted the following in the patient's chart:   Medical and social history Use of alcohol, tobacco or illicit drugs  Current medications and supplements including opioid prescriptions. Patient is not currently taking opioid prescriptions. Functional ability and status Nutritional status Physical activity Advanced directives List of other physicians Hospitalizations, surgeries, and ER visits in previous 12 months Vitals Screenings to include cognitive, depression, and falls Referrals and appointments  In addition, I have reviewed and discussed with patient certain  preventive protocols, quality metrics, and best practice recommendations. A written personalized care plan for preventive services as well as general preventive health recommendations were provided to patient.     Lauree Chandler, NP   04/05/2022    Virtual Visit via Telephone Note  I connected with patient 04/05/22 at  8:40 AM EDT by telephone and verified that I am speaking with the correct person using two identifiers.  Location: Patient: home Provider: [facility]   I discussed the limitations, risks, security and privacy concerns of  performing an evaluation and management service by telephone and the availability of in person appointments. I also discussed with the patient that there may be a patient responsible charge related to this service. The patient expressed understanding and agreed to proceed.   I discussed the assessment and treatment plan with the patient. The patient was provided an opportunity to ask questions and all were answered. The patient agreed with the plan and demonstrated an understanding of the instructions.   The patient was advised to call back or seek an in-person evaluation if the symptoms worsen or if the condition fails to improve as anticipated.  I provided 15 minutes of non-face-to-face time during this encounter.  Martha Osborne. Harle Battiest Avs printed and mailed

## 2022-04-05 NOTE — Patient Instructions (Signed)
[name] , Thank you for taking time to come for your Medicare Wellness Visit. I appreciate your ongoing commitment to your health goals. Please review the following plan we discussed and let me know if I can assist you in the future.   Screening recommendations/referrals: Colonoscopy up to date Mammogram up to date Bone Density up to date Recommended yearly ophthalmology/optometry visit for glaucoma screening and checkup Recommended yearly dental visit for hygiene and checkup  Vaccinations: Influenza vaccine- due annually in September/October Pneumococcal vaccine up to date Tdap vaccine DUE- recommend to get at your local pharmacy Shingles vaccine DUE- recommend to get at your local pharmacy    Advanced directives: on file.   Conditions/risks identified: advanced age  Next appointment: yearly    Preventive Care 10 Years and Older, Female Preventive care refers to lifestyle choices and visits with your health care provider that can promote health and wellness. What does preventive care include? A yearly physical exam. This is also called an annual well check. Dental exams once or twice a year. Routine eye exams. Ask your health care provider how often you should have your eyes checked. Personal lifestyle choices, including: Daily care of your teeth and gums. Regular physical activity. Eating a healthy diet. Avoiding tobacco and drug use. Limiting alcohol use. Practicing safe sex. Taking low-dose aspirin every day. Taking vitamin and mineral supplements as recommended by your health care provider. What happens during an annual well check? The services and screenings done by your health care provider during your annual well check will depend on your age, overall health, lifestyle risk factors, and family history of disease. Counseling  Your health care provider may ask you questions about your: Alcohol use. Tobacco use. Drug use. Emotional well-being. Home and  relationship well-being. Sexual activity. Eating habits. History of falls. Memory and ability to understand (cognition). Work and work Statistician. Reproductive health. Screening  You may have the following tests or measurements: Height, weight, and BMI. Blood pressure. Lipid and cholesterol levels. These may be checked every 5 years, or more frequently if you are over 82 years old. Skin check. Lung cancer screening. You may have this screening every year starting at age 71 if you have a 30-pack-year history of smoking and currently smoke or have quit within the past 15 years. Fecal occult blood test (FOBT) of the stool. You may have this test every year starting at age 4. Flexible sigmoidoscopy or colonoscopy. You may have a sigmoidoscopy every 5 years or a colonoscopy every 10 years starting at age 43. Hepatitis C blood test. Hepatitis B blood test. Sexually transmitted disease (STD) testing. Diabetes screening. This is done by checking your blood sugar (glucose) after you have not eaten for a while (fasting). You may have this done every 1-3 years. Bone density scan. This is done to screen for osteoporosis. You may have this done starting at age 75. Mammogram. This may be done every 1-2 years. Talk to your health care provider about how often you should have regular mammograms. Talk with your health care provider about your test results, treatment options, and if necessary, the need for more tests. Vaccines  Your health care provider may recommend certain vaccines, such as: Influenza vaccine. This is recommended every year. Tetanus, diphtheria, and acellular pertussis (Tdap, Td) vaccine. You may need a Td booster every 10 years. Zoster vaccine. You may need this after age 27. Pneumococcal 13-valent conjugate (PCV13) vaccine. One dose is recommended after age 101. Pneumococcal polysaccharide (PPSV23) vaccine. One  dose is recommended after age 61. Talk to your health care provider  about which screenings and vaccines you need and how often you need them. This information is not intended to replace advice given to you by your health care provider. Make sure you discuss any questions you have with your health care provider. Document Released: 07/17/2015 Document Revised: 03/09/2016 Document Reviewed: 04/21/2015 Elsevier Interactive Patient Education  2017 [employer] Prevention in the Home Falls can cause injuries. They can happen to people of all ages. There are many things you can do to make your home safe and to help prevent falls. What can I do on the outside of my home? Regularly fix the edges of walkways and driveways and fix any cracks. Remove anything that might make you trip as you walk through a door, such as a raised step or threshold. Trim any bushes or trees on the path to your home. Use bright outdoor lighting. Clear any walking paths of anything that might make someone trip, such as rocks or tools. Regularly check to see if handrails are loose or broken. Make sure that both sides of any steps have handrails. Any raised decks and porches should have guardrails on the edges. Have any leaves, snow, or ice cleared regularly. Use sand or salt on walking paths during winter. Clean up any spills in your garage right away. This includes oil or grease spills. What can I do in the bathroom? Use night lights. Install grab bars by the toilet and in the tub and shower. Do not use towel bars as grab bars. Use non-skid mats or decals in the tub or shower. If you need to sit down in the shower, use a plastic, non-slip stool. Keep the floor dry. Clean up any water that spills on the floor as soon as it happens. Remove soap buildup in the tub or shower regularly. Attach bath mats securely with double-sided non-slip rug tape. Do not have throw rugs and other things on the floor that can make you trip. What can I do in the bedroom? Use night lights. Make sure  that you have a light by your bed that is easy to reach. Do not use any sheets or blankets that are too big for your bed. They should not hang down onto the floor. Have a firm chair that has side arms. You can use this for support while you get dressed. Do not have throw rugs and other things on the floor that can make you trip. What can I do in the kitchen? Clean up any spills right away. Avoid walking on wet floors. Keep items that you use a lot in easy-to-reach places. If you need to reach something above you, use a strong step stool that has a grab bar. Keep electrical cords out of the way. Do not use floor polish or wax that makes floors slippery. If you must use wax, use non-skid floor wax. Do not have throw rugs and other things on the floor that can make you trip. What can I do with my stairs? Do not leave any items on the stairs. Make sure that there are handrails on both sides of the stairs and use them. Fix handrails that are broken or loose. Make sure that handrails are as long as the stairways. Check any carpeting to make sure that it is firmly attached to the stairs. Fix any carpet that is loose or worn. Avoid having throw rugs at the top or bottom of the  stairs. If you do have throw rugs, attach them to the floor with carpet tape. Make sure that you have a light switch at the top of the stairs and the bottom of the stairs. If you do not have them, ask someone to add them for you. What else can I do to help prevent falls? Wear shoes that: Do not have high heels. Have rubber bottoms. Are comfortable and fit you well. Are closed at the toe. Do not wear sandals. If you use a stepladder: Make sure that it is fully opened. Do not climb a closed stepladder. Make sure that both sides of the stepladder are locked into place. Ask someone to hold it for you, if possible. Clearly mark and make sure that you can see: Any grab bars or handrails. First and last steps. Where the edge of  each step is. Use tools that help you move around (mobility aids) if they are needed. These include: Canes. Walkers. Scooters. Crutches. Turn on the lights when you go into a dark area. Replace any light bulbs as soon as they burn out. Set up your furniture so you have a clear path. Avoid moving your furniture around. If any of your floors are uneven, fix them. If there are any pets around you, be aware of where they are. Review your medicines with your doctor. Some medicines can make you feel dizzy. This can increase your chance of falling. Ask your doctor what other things that you can do to help prevent falls. This information is not intended to replace advice given to you by your health care provider. Make sure you discuss any questions you have with your health care provider. Document Released: 04/16/2009 Document Revised: 11/26/2015 Document Reviewed: 07/25/2014 Elsevier Interactive Patient Education  2017 [employer].

## 2022-04-07 ENCOUNTER — Inpatient Hospital Stay: Payer: Medicare Other

## 2022-04-19 ENCOUNTER — Other Ambulatory Visit (HOSPITAL_COMMUNITY): Payer: Self-pay

## 2022-04-21 DIAGNOSIS — D485 Neoplasm of uncertain behavior of skin: Secondary | ICD-10-CM | POA: Diagnosis not present

## 2022-04-21 DIAGNOSIS — L570 Actinic keratosis: Secondary | ICD-10-CM | POA: Diagnosis not present

## 2022-04-21 DIAGNOSIS — L820 Inflamed seborrheic keratosis: Secondary | ICD-10-CM | POA: Diagnosis not present

## 2022-04-25 ENCOUNTER — Inpatient Hospital Stay: Payer: Medicare Other | Attending: Hematology & Oncology

## 2022-04-25 VITALS — BP 125/42 | HR 62 | Temp 98.1°F | Resp 16 | Ht 66.0 in | Wt 138.5 lb

## 2022-04-25 DIAGNOSIS — C50912 Malignant neoplasm of unspecified site of left female breast: Secondary | ICD-10-CM | POA: Diagnosis not present

## 2022-04-25 DIAGNOSIS — C50911 Malignant neoplasm of unspecified site of right female breast: Secondary | ICD-10-CM

## 2022-04-25 DIAGNOSIS — Z170 Estrogen receptor positive status [ER+]: Secondary | ICD-10-CM | POA: Insufficient documentation

## 2022-04-25 DIAGNOSIS — M810 Age-related osteoporosis without current pathological fracture: Secondary | ICD-10-CM | POA: Diagnosis not present

## 2022-04-25 MED ORDER — DENOSUMAB 60 MG/ML ~~LOC~~ SOSY
60.0000 mg | PREFILLED_SYRINGE | Freq: Once | SUBCUTANEOUS | Status: AC
  Administered 2022-04-25: 60 mg via SUBCUTANEOUS
  Filled 2022-04-25: qty 1

## 2022-04-25 NOTE — Patient Instructions (Signed)
Denosumab Injection (Osteoporosis) What is this medication? DENOSUMAB (den oh SUE mab) prevents and treats osteoporosis. It works by making your bones stronger and less likely to break (fracture). It is a monoclonal antibody. This medicine may be used for other purposes; ask your health care provider or pharmacist if you have questions. COMMON BRAND NAME(S): Prolia What should I tell my care team before I take this medication? They need to know if you have any of these conditions: Dental or gum disease, or plan to have dental surgery or a tooth pulled Infection Kidney disease Low levels of calcium or vitamin D in your blood On dialysis Poor nutrition Skin conditions Thyroid disease, or have had thyroid or parathyroid surgery Trouble absorbing minerals in your stomach or intestine An unusual reaction to denosumab, other medications, foods, dyes, or preservatives Pregnant or trying to get pregnant Breast-feeding How should I use this medication? This medication is injected under the skin. It is given by your care team in a hospital or clinic setting. A special MedGuide will be given to you before each treatment. Be sure to read this information carefully each time. Talk to your care team about the use of this medication in children. Special care may be needed. Overdosage: If you think you have taken too much of this medicine contact a poison control center or emergency room at once. NOTE: This medicine is only for you. Do not share this medicine with others. What if I miss a dose? Keep appointments for follow-up doses. It is important not to miss your dose. Call your care team if you are unable to keep an appointment. What may interact with this medication? Do not take this medication with any of the following: Other medications that contain denosumab This medication may also interact with the following: Medications that lower your chance of fighting infection Steroid medications, such  as prednisone or cortisone This list may not describe all possible interactions. Give your health care provider a list of all the medicines, herbs, non-prescription drugs, or dietary supplements you use. Also tell them if you smoke, drink alcohol, or use illegal drugs. Some items may interact with your medicine. What should I watch for while using this medication? Your condition will be monitored carefully while you are receiving this medication. You may need blood work while taking this medication. This medication may increase your risk of getting an infection. Call your care team for advice if you get a fever, chills, sore throat, or other symptoms of a cold or flu. Do not treat yourself. Try to avoid being around people who are sick. Tell your dentist and dental surgeon that you are taking this medication. You should not have major dental surgery while on this medication. See your dentist to have a dental exam and fix any dental problems before starting this medication. Take good care of your teeth while on this medication. Make sure you see your dentist for regular follow-up appointments. You should make sure you get enough calcium and vitamin D while you are taking this medication. Discuss the foods you eat and the vitamins you take with your care team. Talk to your care team if you are pregnant or think you might be pregnant. This medication can cause serious birth defects if taken during pregnancy and for 5 months after the last dose. You will need a negative pregnancy test before starting this medication. Contraception is recommended while taking this medication and for 5 months after the last dose. Your care team can   help you find the option that works for you. Talk to your care team before breastfeeding. Changes to your treatment plan may be needed. What side effects may I notice from receiving this medication? Side effects that you should report to your care team as soon as possible: Allergic  reactions--skin rash, itching, hives, swelling of the face, lips, tongue, or throat Infection--fever, chills, cough, sore throat, wounds that don't heal, pain or trouble when passing urine, general feeling of discomfort or being unwell Low calcium level--muscle pain or cramps, confusion, tingling, or numbness in the hands or feet Osteonecrosis of the jaw--pain, swelling, or redness in the mouth, numbness of the jaw, poor healing after dental work, unusual discharge from the mouth, visible bones in the mouth Severe bone, joint, or muscle pain Skin infection--skin redness, swelling, warmth, or pain Side effects that usually do not require medical attention (report these to your care team if they continue or are bothersome): Back pain Headache Joint pain Muscle pain Pain in the hands, arms, legs, or feet Runny or stuffy nose Sore throat This list may not describe all possible side effects. Call your doctor for medical advice about side effects. You may report side effects to FDA at 1-800-FDA-1088. Where should I keep my medication? This medication is given in a hospital or clinic. It will not be stored at home. NOTE: This sheet is a summary. It may not cover all possible information. If you have questions about this medicine, talk to your doctor, pharmacist, or health care provider.  2023 Elsevier/Gold Standard (2021-11-01 00:00:00)  

## 2022-04-26 ENCOUNTER — Other Ambulatory Visit (HOSPITAL_COMMUNITY): Payer: Self-pay

## 2022-04-26 ENCOUNTER — Ambulatory Visit
Admission: RE | Admit: 2022-04-26 | Discharge: 2022-04-26 | Disposition: A | Discharge status: Home / Self Care | Payer: Medicare Other | Source: Ambulatory Visit | Attending: General Surgery | Admitting: General Surgery

## 2022-04-26 DIAGNOSIS — C50312 Malignant neoplasm of lower-inner quadrant of left female breast: Secondary | ICD-10-CM

## 2022-04-26 DIAGNOSIS — C50911 Malignant neoplasm of unspecified site of right female breast: Secondary | ICD-10-CM

## 2022-04-26 DIAGNOSIS — Z170 Estrogen receptor positive status [ER+]: Secondary | ICD-10-CM | POA: Diagnosis not present

## 2022-04-26 DIAGNOSIS — Z853 Personal history of malignant neoplasm of breast: Secondary | ICD-10-CM

## 2022-04-26 MED ORDER — GADOPICLENOL 0.5 MMOL/ML IV SOLN
6.0000 mL | Freq: Once | INTRAVENOUS | Status: AC | PRN
  Administered 2022-04-26: 6 mL via INTRAVENOUS

## 2022-04-27 ENCOUNTER — Other Ambulatory Visit (HOSPITAL_COMMUNITY): Payer: Self-pay

## 2022-04-28 ENCOUNTER — Other Ambulatory Visit (HOSPITAL_COMMUNITY): Payer: Self-pay

## 2022-04-28 DIAGNOSIS — H43391 Other vitreous opacities, right eye: Secondary | ICD-10-CM | POA: Diagnosis not present

## 2022-04-28 DIAGNOSIS — H43812 Vitreous degeneration, left eye: Secondary | ICD-10-CM | POA: Diagnosis not present

## 2022-04-28 DIAGNOSIS — H1045 Other chronic allergic conjunctivitis: Secondary | ICD-10-CM | POA: Diagnosis not present

## 2022-04-28 DIAGNOSIS — H2513 Age-related nuclear cataract, bilateral: Secondary | ICD-10-CM | POA: Diagnosis not present

## 2022-04-30 DIAGNOSIS — Z23 Encounter for immunization: Secondary | ICD-10-CM | POA: Diagnosis not present

## 2022-05-16 ENCOUNTER — Encounter: Payer: Self-pay | Admitting: Family

## 2022-05-16 ENCOUNTER — Ambulatory Visit (INDEPENDENT_AMBULATORY_CARE_PROVIDER_SITE_OTHER): Payer: Medicare Other | Admitting: Family

## 2022-05-16 VITALS — BP 130/64 | HR 87 | Temp 97.1°F | Resp 18 | Ht 67.5 in | Wt 141.0 lb

## 2022-05-16 DIAGNOSIS — K219 Gastro-esophageal reflux disease without esophagitis: Secondary | ICD-10-CM

## 2022-05-16 MED ORDER — PANTOPRAZOLE SODIUM 40 MG PO TBEC: 40.0000 mg | DELAYED_RELEASE_TABLET | Freq: Every day | ORAL | 3 refills | Status: DC

## 2022-05-16 NOTE — Patient Instructions (Signed)
Food Choices for Gastroesophageal Reflux Disease, Adult When you have gastroesophageal reflux disease (GERD), the foods you eat and your eating habits are very important. Choosing the right foods can help ease the discomfort of GERD. Consider working with a dietitian to help you make healthy food choices. What are tips for following this plan? Reading food labels Look for foods that are low in saturated fat. Foods that have less than 5% of daily value (DV) of fat and 0 g of trans fats may help with your symptoms. Cooking Cook foods using methods other than frying. This may include baking, steaming, grilling, or broiling. These are all methods that do not need a lot of fat for cooking. To add flavor, try to use herbs that are low in spice and acidity. Meal planning  Choose healthy foods that are low in fat, such as fruits, vegetables, whole grains, low-fat dairy products, lean meats, fish, and poultry. Eat frequent, small meals instead of three large meals each day. Eat your meals slowly, in a relaxed setting. Avoid bending over or lying down until 2-3 hours after eating. Limit high-fat foods such as fatty meats or fried foods. Limit your intake of fatty foods, such as oils, butter, and shortening. Avoid the following as told by your health care provider: Foods that cause symptoms. These may be different for different people. Keep a food diary to keep track of foods that cause symptoms. Alcohol. Drinking large amounts of liquid with meals. Eating meals during the 2-3 hours before bed. Lifestyle Maintain a healthy weight. Ask your health care provider what weight is healthy for you. If you need to lose weight, work with your health care provider to do so safely. Exercise for at least 30 minutes on 5 or more days each week, or as told by your health care provider. Avoid wearing clothes that fit tightly around your waist and chest. Do not use any products that contain nicotine or tobacco. These  products include cigarettes, chewing tobacco, and vaping devices, such as e-cigarettes. If you need help quitting, ask your health care provider. Sleep with the head of your bed raised. Use a wedge under the mattress or blocks under the bed frame to raise the head of the bed. Chew sugar-free gum after mealtimes. What foods should I eat?  Eat a healthy, well-balanced diet of fruits, vegetables, whole grains, low-fat dairy products, lean meats, fish, and poultry. Each person is different. Foods that may trigger symptoms in one person may not trigger any symptoms in another person. Work with your health care provider to identify foods that are safe for you. The items listed above may not be a complete list of recommended foods and beverages. Contact a dietitian for more information. What foods should I avoid? Limiting some of these foods may help manage the symptoms of GERD. Everyone is different. Consult a dietitian or your health care provider to help you identify the exact foods to avoid, if any. Fruits Any fruits prepared with added fat. Any fruits that cause symptoms. For some people this may include citrus fruits, such as oranges, grapefruit, pineapple, and lemons. Vegetables Deep-fried vegetables. French fries. Any vegetables prepared with added fat. Any vegetables that cause symptoms. For some people, this may include tomatoes and tomato products, chili peppers, onions and garlic, and horseradish. Grains Pastries or quick breads with added fat. Meats and other proteins High-fat meats, such as fatty beef or pork, hot dogs, ribs, ham, sausage, salami, and bacon. Fried meat or protein, including   fried fish and fried chicken. Nuts and nut butters, in large amounts. Dairy Whole milk and chocolate milk. Sour cream. Cream. Ice cream. Cream cheese. Milkshakes. Fats and oils Butter. Margarine. Shortening. Ghee. Beverages Coffee and tea, with or without caffeine. Carbonated beverages. Sodas. Energy  drinks. Fruit juice made with acidic fruits, such as orange or grapefruit. Tomato juice. Alcoholic drinks. Sweets and desserts Chocolate and cocoa. Donuts. Seasonings and condiments Pepper. Peppermint and spearmint. Added salt. Any condiments, herbs, or seasonings that cause symptoms. For some people, this may include curry, hot sauce, or vinegar-based salad dressings. The items listed above may not be a complete list of foods and beverages to avoid. Contact a dietitian for more information. Questions to ask your health care provider Diet and lifestyle changes are usually the first steps that are taken to manage symptoms of GERD. If diet and lifestyle changes do not improve your symptoms, talk with your health care provider about taking medicines. Where to find more information [employer]: aboutgerd.org Summary When you have gastroesophageal reflux disease (GERD), food and lifestyle choices may be very helpful in easing the discomfort of GERD. Eat frequent, small meals instead of three large meals each day. Eat your meals slowly, in a relaxed setting. Avoid bending over or lying down until 2-3 hours after eating. Limit high-fat foods such as fatty meats or fried foods. This information is not intended to replace advice given to you by your health care provider. Make sure you discuss any questions you have with your health care provider. Document Revised: 12/30/2019 Document Reviewed: 12/30/2019 Elsevier Patient Education  2023 [employer]  

## 2022-05-16 NOTE — Progress Notes (Signed)
Provider: Rane Dumm FNP-C  Lauree Chandler, NP  Patient Care Team: Lauree Chandler, NP as PCP - General (Geriatric Medicine) Volanda Napoleon, MD as Medical Oncologist (Oncology) Stark Klein, MD as Consulting Physician (General Surgery) Jari Pigg, MD as Consulting Physician (Dermatology) Dian Queen, MD as Consulting Physician (Obstetrics and Gynecology)  Extended Emergency Contact Information Primary Emergency Contact: [facility] Address: [address]          [place], [place] [address] Johnnette Litter of [place] Phone: [phone] Relation: Spouse Secondary Emergency Contact: Pavon,doug Mobile Phone: [phone] Relation: Son Interpreter needed? No  Code Status:  DNR Goals of care: Advanced Directive information    04/05/2022    8:45 AM  Advanced Directives  Does Patient Have a Medical Advance Directive? Yes  Does patient want to make changes to medical advance directive? No - Patient declined  Copy of [place] in Chart? Yes - validated most recent copy scanned in chart (See row information)     Chief Complaint  Patient presents with   Acute Visit    Patient is here for acid reflux and heart burn    HPI:  Pt is a 71 y.o. female seen today for an acute visit for evaluation of acid reflex x 1 month.started after she went on vacation.thought it was change of diet.Has been home now but still no changes.Has famotidine and Gas X but no relief.blenching seems to help.sometimes feels like something is " sitting in the throat".Has epigastric pain which moves around.she denies any chest pain,palpitation,shortness of breath,dark or blood in the stool.    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, right ([facility]) 26/94/8546   Complication of anesthesia    very sensitive to meds   Goals of care, counseling/discussion 08/30/2018   History of bone density study 09/29/2020   History  of colonoscopy 11/24/2020   History of EKG 10/10/2018   History of mammogram 01/17/2020   History of MRI    Breast (03/12/2020), Brain (06/13/2019) and Knee (02/23/2018& 05/22/2018)   Osteoporosis    Personal history of chemotherapy    Shingles    Skin cancer    basal cell x 2 (on back and on nose)   Tinnitus of left ear    Past Surgical History:  Procedure Laterality Date   bone cyst removed Right 1978   BREAST BIOPSY Left 08/28/2018   COLON RESECTION Right 09/14/2018   Procedure: LAPAROSCOPIC  ASSISTED RIGHT COLECTOMY;  Surgeon: Fanny Skates, MD;  Location: [facility];  Service: General;  Laterality: Right;   COLONOSCOPY     LAPAROSCOPIC RIGHT COLECTOMY  09/14/2018    Allergies  Allergen Reactions   Kisqali (200 Mg Dose) [Ribociclib Succ (200 Mg Dose)] Itching and Rash    scratchy throat.    Outpatient Encounter Medications as of 05/16/2022  Medication Sig   abemaciclib (VERZENIO) 100 MG tablet Take 1 tablet (100 mg total) by mouth daily.   Carboxymeth-Glycerin-Polysorb (REFRESH OPTIVE ADVANCED) 0.5-1-0.5 % SOLN Apply 1 drop to eye daily as needed (Both eyes).   Cholecalciferol (VITAMIN D3 PO) Take 2,000 Units by mouth daily.   denosumab (PROLIA) 60 MG/ML SOSY injection Inject 60 mg into the skin every 6 (six) months.   letrozole (FEMARA) 2.5 MG tablet TAKE 1 TABLET BY MOUTH ONCE A DAY   loperamide (IMODIUM) 2 MG capsule Take 2 mg by mouth as needed.   Psyllium (METAMUCIL) 48.57 % POWD 1 packet  with 8 ounces of liquid as needed   No facility-administered encounter medications on file as of 05/16/2022.    Review of Systems  Constitutional:  Negative for appetite change, chills, fatigue, fever and unexpected weight change.  Respiratory:  Negative for cough, chest tightness, shortness of breath and wheezing.   Cardiovascular:  Negative for chest pain, palpitations and leg swelling.  Gastrointestinal:  Negative for abdominal distention, blood in stool, constipation, diarrhea,  nausea and vomiting.       Epigastric pain ,Heart burn   Genitourinary:  Negative for urgency.  Skin:  Negative for color change, pallor and rash.  Neurological:  Negative for dizziness, weakness, light-headedness, numbness and headaches.    Immunization History  Administered Date(s) Administered   Fluad Quad(high Dose 65+) 05/05/2022   Influenza, High Dose Seasonal PF 05/01/2019, 05/07/2021   Influenza, Quadrivalent, Recombinant, Inj, Pf 04/19/2018   Influenza-Unspecified 06/04/2018, 05/01/2019, 04/23/2020   Moderna SARS-COV2 Booster Vaccination 01/23/2021   Moderna Sars-Covid-2 Vaccination 08/15/2019, 09/13/2019, 05/15/2020   Pfizer Covid-19 Vaccine Bivalent Booster 87yr & up 04/16/2021   Pneumococcal Conjugate-13 04/28/2017   Pneumococcal Polysaccharide-23 06/20/2018   Tdap 06/21/2011   Zoster Recombinat (Shingrix) 03/22/2012   Pertinent  Health Maintenance Due  Topic Date Due   MAMMOGRAM  08/18/2023   COLONOSCOPY (Pts 45-477yrInsurance coverage will need to be confirmed)  11/18/2023   INFLUENZA VACCINE  Completed   DEXA SCAN  Completed      07/09/2021   12:09 PM 11/12/2021   11:05 AM 12/24/2021    1:32 PM 03/17/2022   11:14 AM 04/05/2022    8:47 AM  Fall Risk  Falls in the past year?   0  0  Was there an injury with Fall?   0  0  Fall Risk Category Calculator   0  0  Fall Risk Category   Low  Low  Patient Fall Risk Level Low fall risk Low fall risk Low fall risk Low fall risk Low fall risk  Patient at Risk for Falls Due to   No Fall Risks  No Fall Risks  Fall risk Follow up   Falls evaluation completed     Functional Status Survey:    Vitals:   05/16/22 1344  BP: 130/64  Pulse: 87  Resp: 18  Temp: (!) 97.1 F (36.2 C)  SpO2: 99%  Weight: 141 lb (64 kg)  Height: 5' 7.5" (1.715 m)   Body mass index is 21.76 kg/m. Physical Exam Vitals reviewed.  Constitutional:      General: She is not in acute distress.    Appearance: Normal appearance. She is normal  weight. She is not ill-appearing or diaphoretic.  HENT:     Head: Normocephalic.  Neck:     Vascular: No carotid bruit.  Cardiovascular:     Rate and Rhythm: Normal rate and regular rhythm.     Pulses: Normal pulses.     Heart sounds: Normal heart sounds. No murmur heard.    No friction rub. No gallop.  Pulmonary:     Effort: Pulmonary effort is normal. No respiratory distress.     Breath sounds: Normal breath sounds. No wheezing, rhonchi or rales.  Chest:     Chest wall: No tenderness.  Abdominal:     General: Bowel sounds are normal. There is no distension.     Palpations: Abdomen is soft. There is no mass.     Tenderness: There is no right CVA tenderness, left CVA tenderness, guarding or rebound.  Comments: Slight epigastric tenderness    Musculoskeletal:        General: No swelling or tenderness. Normal range of motion.     Cervical back: Normal range of motion. No rigidity or tenderness.     Right lower leg: No edema.     Left lower leg: No edema.  Lymphadenopathy:     Cervical: No cervical adenopathy.  Skin:    General: Skin is warm and dry.     Coloration: Skin is not pale.     Findings: No erythema or rash.  Neurological:     Mental Status: She is alert and oriented to person, place, and time.     Motor: No weakness.     Gait: Gait normal.  Psychiatric:        Mood and Affect: Mood normal.        Speech: Speech normal.        Behavior: Behavior normal.     Labs reviewed: Recent Labs    07/09/21 1143 11/12/21 1027 03/17/22 1020  NA 137 137 137  K 4.6 4.1 4.5  CL 103 105 101  CO2 '29 27 30  '$ GLUCOSE 97 85 106*  BUN '20 17 18  '$ CREATININE 1.14* 0.95 1.17*  CALCIUM 9.8 9.4 10.0   Recent Labs    07/09/21 1143 11/12/21 1027 03/17/22 1020  AST '18 19 17  '$ ALT '11 14 11  '$ ALKPHOS 55 59 53  BILITOT 0.4 0.3 0.4  PROT 6.5 6.4* 7.1  ALBUMIN 4.0 3.9 4.0   Recent Labs    07/09/21 1143 11/12/21 1027 03/17/22 1020  WBC 4.2 4.6 3.7*  NEUTROABS 2.3 2.8 1.9   HGB 12.5 11.9* 12.3  HCT 37.1 35.7* 36.6  MCV 100.0 100.0 98.4  PLT 103* 113* 85*   No results found for: "TSH" Lab Results  Component Value Date   HGBA1C 5.4 09/03/2018   Lab Results  Component Value Date   CHOL 178 06/21/2021   HDL 62 06/21/2021   LDLCALC 98 06/21/2021   TRIG 90 06/21/2021   CHOLHDL 2.9 06/21/2021    Significant Diagnostic Results in last 30 days:  MR BREAST BILATERAL W [place] CAD  Result Date: 04/26/2022 CLINICAL DATA:  Diagnostic evaluation. History of metastatic left breast cancer in 2020 with metastasis to the colon. Patient has not undergone left breast surgery. Status post chemotherapy. MRI in September 2021 showed complete imaging response to neoadjuvant chemotherapy. EXAM: BILATERAL BREAST MRI WITH AND WITHOUT CONTRAST TECHNIQUE: Multiplanar, multisequence MR images of both breasts were obtained prior to and following the intravenous administration of 6 ml of Vueway Three-dimensional MR images were rendered by post-processing of the original MR data on an independent workstation. The three-dimensional MR images were interpreted, and findings are reported in the following complete MRI report for this study. Three dimensional images were evaluated at the independent interpreting workstation using the DynaCAD thin client. COMPARISON:  Previous exam(s). FINDINGS: Breast composition: c. Heterogeneous fibroglandular tissue. Background parenchymal enhancement: Moderate. Right breast: No mass or abnormal enhancement. Left breast: No mass or abnormal enhancement. There is susceptibility artifact in the lower inner left breast denoting a biopsy marking clip. There is no new enhancing mass or non mass enhancement at the biopsy site. Lymph nodes: No abnormal appearing lymph nodes. Ancillary findings:  None. IMPRESSION: 1. No MRI evidence of malignancy in either breast. 2. No new suspicious findings at the biopsy site in the left breast. RECOMMENDATION: Per treatment  plan. BI-RADS CATEGORY  1: Negative.  Electronically Signed   By: Audie Pinto M.D.   On: 04/26/2022 13:14   Assessment/Plan   Gastroesophageal reflux disease without esophagitis Symptoms have worsen over the past one month after going for vacation with change of her diet.Has taken Famotidine as needed. No tarry,dark or blood in stool. - discussed initiating Protonix for one month to 12 weeks then wean off.May consider Famotidine daily if still no relief.  - Advised to notify provider or go to ED if symptoms worsen or develop any shortness of breath,chest pain or pressure. - pantoprazole (PROTONIX) 40 MG tablet; Take 1 tablet (40 mg total) by mouth daily.  Dispense: 30 tablet; Refill: 3 - Additional Education information GERD provided on AVS   Family/ staff Communication: Reviewed plan of care with patient verbalized understanding   Labs/tests ordered: None   Next Appointment: Return if symptoms worsen or fail to improve.    Sandrea Hughs, NP

## 2022-05-17 ENCOUNTER — Other Ambulatory Visit (HOSPITAL_COMMUNITY): Payer: Self-pay

## 2022-05-23 ENCOUNTER — Other Ambulatory Visit (HOSPITAL_COMMUNITY): Payer: Self-pay

## 2022-05-28 ENCOUNTER — Other Ambulatory Visit (HOSPITAL_COMMUNITY): Payer: Self-pay

## 2022-05-31 ENCOUNTER — Other Ambulatory Visit (HOSPITAL_COMMUNITY): Payer: Self-pay

## 2022-05-31 DIAGNOSIS — L821 Other seborrheic keratosis: Secondary | ICD-10-CM | POA: Diagnosis not present

## 2022-05-31 DIAGNOSIS — D2372 Other benign neoplasm of skin of left lower limb, including hip: Secondary | ICD-10-CM | POA: Diagnosis not present

## 2022-05-31 DIAGNOSIS — Z85828 Personal history of other malignant neoplasm of skin: Secondary | ICD-10-CM | POA: Diagnosis not present

## 2022-05-31 DIAGNOSIS — D225 Melanocytic nevi of trunk: Secondary | ICD-10-CM | POA: Diagnosis not present

## 2022-05-31 DIAGNOSIS — L578 Other skin changes due to chronic exposure to nonionizing radiation: Secondary | ICD-10-CM | POA: Diagnosis not present

## 2022-05-31 DIAGNOSIS — L570 Actinic keratosis: Secondary | ICD-10-CM | POA: Diagnosis not present

## 2022-06-14 ENCOUNTER — Other Ambulatory Visit (HOSPITAL_COMMUNITY): Payer: Self-pay

## 2022-06-14 DIAGNOSIS — C50911 Malignant neoplasm of unspecified site of right female breast: Secondary | ICD-10-CM | POA: Diagnosis not present

## 2022-06-14 DIAGNOSIS — C785 Secondary malignant neoplasm of large intestine and rectum: Secondary | ICD-10-CM | POA: Diagnosis not present

## 2022-06-14 DIAGNOSIS — Z170 Estrogen receptor positive status [ER+]: Secondary | ICD-10-CM | POA: Diagnosis not present

## 2022-06-14 DIAGNOSIS — C50312 Malignant neoplasm of lower-inner quadrant of left female breast: Secondary | ICD-10-CM | POA: Diagnosis not present

## 2022-06-15 ENCOUNTER — Other Ambulatory Visit: Payer: Self-pay

## 2022-06-15 ENCOUNTER — Other Ambulatory Visit (HOSPITAL_COMMUNITY): Payer: Self-pay

## 2022-06-16 ENCOUNTER — Inpatient Hospital Stay (HOSPITAL_BASED_OUTPATIENT_CLINIC_OR_DEPARTMENT_OTHER): Payer: Medicare Other | Admitting: Hematology & Oncology

## 2022-06-16 ENCOUNTER — Other Ambulatory Visit: Payer: Self-pay

## 2022-06-16 ENCOUNTER — Inpatient Hospital Stay: Payer: Medicare Other | Attending: Hematology & Oncology

## 2022-06-16 ENCOUNTER — Encounter: Payer: Self-pay | Admitting: Hematology & Oncology

## 2022-06-16 VITALS — BP 120/46 | HR 75 | Temp 98.5°F | Resp 18 | Ht 67.5 in | Wt 139.1 lb

## 2022-06-16 DIAGNOSIS — Z79899 Other long term (current) drug therapy: Secondary | ICD-10-CM | POA: Insufficient documentation

## 2022-06-16 DIAGNOSIS — Z170 Estrogen receptor positive status [ER+]: Secondary | ICD-10-CM | POA: Insufficient documentation

## 2022-06-16 DIAGNOSIS — M810 Age-related osteoporosis without current pathological fracture: Secondary | ICD-10-CM | POA: Insufficient documentation

## 2022-06-16 DIAGNOSIS — Z9049 Acquired absence of other specified parts of digestive tract: Secondary | ICD-10-CM | POA: Insufficient documentation

## 2022-06-16 DIAGNOSIS — C785 Secondary malignant neoplasm of large intestine and rectum: Secondary | ICD-10-CM | POA: Diagnosis not present

## 2022-06-16 DIAGNOSIS — C50912 Malignant neoplasm of unspecified site of left female breast: Secondary | ICD-10-CM | POA: Insufficient documentation

## 2022-06-16 DIAGNOSIS — Z79811 Long term (current) use of aromatase inhibitors: Secondary | ICD-10-CM | POA: Insufficient documentation

## 2022-06-16 DIAGNOSIS — C50911 Malignant neoplasm of unspecified site of right female breast: Secondary | ICD-10-CM

## 2022-06-16 LAB — CMP (CANCER CENTER ONLY)
ALT: 13 U/L (ref 0–44)
AST: 21 U/L (ref 15–41)
Albumin: 4.2 g/dL (ref 3.5–5.0)
Alkaline Phosphatase: 57 U/L (ref 38–126)
Anion gap: 6 (ref 5–15)
BUN: 17 mg/dL (ref 8–23)
CO2: 28 mmol/L (ref 22–32)
Calcium: 9.3 mg/dL (ref 8.9–10.3)
Chloride: 103 mmol/L (ref 98–111)
Creatinine: 1.13 mg/dL — ABNORMAL HIGH (ref 0.44–1.00)
GFR, Estimated: 52 mL/min — ABNORMAL LOW (ref >60)
Glucose, Bld: 79 mg/dL (ref 70–99)
Potassium: 4.3 mmol/L (ref 3.5–5.1)
Sodium: 137 mmol/L (ref 135–145)
Total Bilirubin: 0.3 mg/dL (ref 0.3–1.2)
Total Protein: 6.9 g/dL (ref 6.5–8.1)

## 2022-06-16 LAB — CBC WITH DIFFERENTIAL (CANCER CENTER ONLY)
Abs Immature Granulocytes: 0.01 10*3/uL (ref 0.00–0.07)
Basophils Absolute: 0 10*3/uL (ref 0.0–0.1)
Basophils Relative: 1 %
Eosinophils Absolute: 0.1 10*3/uL (ref 0.0–0.5)
Eosinophils Relative: 2 %
HCT: 36.1 % (ref 36.0–46.0)
Hemoglobin: 12.3 g/dL (ref 12.0–15.0)
Immature Granulocytes: 0 %
Lymphocytes Relative: 35 %
Lymphs Abs: 1.4 10*3/uL (ref 0.7–4.0)
MCH: 33.8 pg (ref 26.0–34.0)
MCHC: 34.1 g/dL (ref 30.0–36.0)
MCV: 99.2 fL (ref 80.0–100.0)
Monocytes Absolute: 0.3 10*3/uL (ref 0.1–1.0)
Monocytes Relative: 7 %
Neutro Abs: 2.3 10*3/uL (ref 1.7–7.7)
Neutrophils Relative %: 55 %
Platelet Count: 98 10*3/uL — ABNORMAL LOW (ref 150–400)
RBC: 3.64 MIL/uL — ABNORMAL LOW (ref 3.87–5.11)
RDW: 12 % (ref 11.5–15.5)
WBC Count: 4.1 10*3/uL (ref 4.0–10.5)
nRBC: 0 % (ref 0.0–0.2)

## 2022-06-16 LAB — LACTATE DEHYDROGENASE: LDH: 102 U/L (ref 98–192)

## 2022-06-16 NOTE — Progress Notes (Signed)
Hematology and Oncology Follow Up Visit  Martha Osborne [id number] Jun 03, 1951 71 y.o. 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 daily Ribociclib 600 mg p.o. daily (21/7) -- d/c due to skin rash Prolia 60 mg IM q. 6 months -- next dose on 09/2022       Verzenio 100 mg po day - started on 06/12/2019 -changed 10/2021                    Interim History:  Martha Osborne is back for follow-up.  Last saw her back in July.  Since then, she been doing quite well.  She has been quite active.  She and her husband are down to [place] to see a son and daughter-in-law.  They will be going down for Christmas and then for a month in January.  She has had no problems with the Verzenio.  She says that the change in dosing really made her feel better.  She did not have any diarrhea.  She does not have any dyspepsia.  I think that the dyspepsia probably was secondary to her taking aspirin.  Her last CA 27.29 was holding steady at 25.  She has had no change in bowel or bladder habits.  She has had no abdominal pain.  There is been no rashes.  She has had no bleeding.  There is no cough or shortness of breath.  Overall, I would say performance status is probably ECOG 1.    Medications:  Current Outpatient Medications:    abemaciclib (VERZENIO) 100 MG tablet, Take 1 tablet (100 mg total) by mouth daily., Disp: 30 tablet, Rfl: 6   Carboxymeth-Glycerin-Polysorb (REFRESH OPTIVE ADVANCED) 0.5-1-0.5 % SOLN, Apply 1 drop to eye daily as needed (Both eyes)., Disp: , Rfl:    Cholecalciferol (VITAMIN D3 PO), Take 2,000 Units by mouth daily., Disp: , Rfl:    denosumab (PROLIA) 60 MG/ML SOSY injection, Inject 60 mg into the skin every 6 (six) months., Disp: , Rfl:    letrozole (FEMARA) 2.5 MG tablet, TAKE 1 TABLET BY MOUTH ONCE A DAY,  Disp: 90 tablet, Rfl: 6   loperamide (IMODIUM) 2 MG capsule, Take 2 mg by mouth as needed., Disp: , Rfl:    pantoprazole (PROTONIX) 40 MG tablet, Take 1 tablet (40 mg total) by mouth daily., Disp: 30 tablet, Rfl: 3   Psyllium (METAMUCIL) 48.57 % POWD, 1 packet with 8 ounces of liquid as needed, Disp: , Rfl:   Allergies:  Allergies  Allergen Reactions   Kisqali (200 Mg Dose) [Ribociclib Succ (200 Mg Dose)] Itching and Rash    scratchy throat.    Past Medical History, Surgical history, Social history, and Family History were reviewed and updated.  Review of Systems: Review of Systems  Constitutional: Negative.   HENT:  Negative.   Eyes: Negative.   Respiratory: Negative.   Cardiovascular: Negative.   Gastrointestinal: Positive for abdominal pain.  Endocrine: Negative.   Genitourinary: Negative.    Musculoskeletal: Negative.   Skin: Negative.   Neurological: Negative.   Hematological: Negative.   Psychiatric/Behavioral: Negative.     Physical Exam:  height is 5' 7.5" (1.715 m) and weight is 139 lb 1.9 oz (63.1 kg). Her oral temperature is 98.5 F (36.9 C). Her blood pressure is 120/46 (abnormal) and her pulse is 75. Her respiration is 18  and oxygen saturation is 100%.   Wt Readings from Last 3 Encounters:  06/16/22 139 lb 1.9 oz (63.1 kg)  05/16/22 141 lb (64 kg)  04/25/22 138 lb 8 oz (62.8 kg)    Physical Exam Vitals signs reviewed.  Constitutional:      Comments: Breast exam bilaterally shows no breast masses.  She has no nipple discharge bilaterally.  She has no breast swelling or erythema.  There is no bilateral axillary adenopathy.  HENT:     Head: Normocephalic and atraumatic.  Eyes:     Pupils: Pupils are equal, round, and reactive to light.  Neck:     Musculoskeletal: Normal range of motion.  Cardiovascular:     Rate and Rhythm: Normal rate and regular rhythm.     Heart sounds: Normal heart sounds.  Pulmonary:     Effort: Pulmonary effort is normal.      Breath sounds: Normal breath sounds.  Abdominal:     General: Bowel sounds are normal.     Palpations: Abdomen is soft.     Comments: Abdominal exam shows the healing laparotomy scar.  She has no swelling.  There is no erythema about the surgical site.  She has been tenderness to palpation in the right lower quadrant.  No masses noted.  Bowel sounds are present.  There is no palpable liver or spleen tip.  Musculoskeletal: Normal range of motion.        General: No tenderness or deformity.  Lymphadenopathy:     Cervical: No cervical adenopathy.  Skin:    General: Skin is warm and dry.     Findings: No erythema or rash.  Neurological:     Mental Status: She is alert and oriented to person, place, and time.  Psychiatric:        Behavior: Behavior normal.        Thought Content: Thought content normal.        Judgment: Judgment normal.      Lab Results  Component Value Date   WBC 4.1 06/16/2022   HGB 12.3 06/16/2022   HCT 36.1 06/16/2022   MCV 99.2 06/16/2022   PLT 98 (L) 06/16/2022     Chemistry      Component Value Date/Time   NA 137 06/16/2022 0957   K 4.3 06/16/2022 0957   CL 103 06/16/2022 0957   CO2 28 06/16/2022 0957   BUN 17 06/16/2022 0957   CREATININE 1.13 (H) 06/16/2022 0957      Component Value Date/Time   CALCIUM 9.3 06/16/2022 0957   ALKPHOS 57 06/16/2022 0957   AST 21 06/16/2022 0957   ALT 13 06/16/2022 0957   BILITOT 0.3 06/16/2022 0957       Impression and Plan: Martha Osborne is a 71 year old postmenopausal female.  She has metastatic lobular carcinoma of the left breast.  We finally found her primary after doing a breast MRI.  She had resection of the colonic mass.  She had multiple positive lymph nodes.   Again, she is doing incredibly well with the Femara/Verzenio.  I am very happy about this.  Her quality life is doing well.  She is able to do what she would like.  I know that she will have a wonderful time in [place] with her family.  I know that  they are looking forward to going down for a whole month in January.  Her last PET scan was back in April.  We are going to have to do another 1.  We will have to set this up when we see her back.  Will have this done in March.  She will have her player when we see her back in March.   [name], MD

## 2022-06-17 ENCOUNTER — Other Ambulatory Visit: Payer: Self-pay | Admitting: General Surgery

## 2022-06-17 ENCOUNTER — Telehealth: Payer: Self-pay

## 2022-06-17 DIAGNOSIS — C50312 Malignant neoplasm of lower-inner quadrant of left female breast: Secondary | ICD-10-CM

## 2022-06-17 LAB — CANCER ANTIGEN 27.29: CA 27.29: 21.3 U/mL (ref 0.0–38.6)

## 2022-06-17 NOTE — Telephone Encounter (Signed)
-----   Message from Volanda Napoleon, MD sent at 06/17/2022  5:57 AM EST ----- Call - the tumor marker - CA 27.29 - is normal!!!  Have a great trip to [place]!!!  Laurey Arrow

## 2022-06-17 NOTE — Telephone Encounter (Signed)
Advised via MyChart.

## 2022-06-18 DIAGNOSIS — Z23 Encounter for immunization: Secondary | ICD-10-CM | POA: Diagnosis not present

## 2022-06-30 ENCOUNTER — Other Ambulatory Visit (HOSPITAL_COMMUNITY): Payer: Self-pay

## 2022-06-30 ENCOUNTER — Encounter: Payer: Self-pay | Admitting: Nurse Practitioner

## 2022-07-01 ENCOUNTER — Encounter: Payer: Self-pay | Admitting: Nurse Practitioner

## 2022-07-01 ENCOUNTER — Ambulatory Visit (INDEPENDENT_AMBULATORY_CARE_PROVIDER_SITE_OTHER): Payer: Medicare Other | Admitting: Nurse Practitioner

## 2022-07-01 VITALS — BP 124/80 | HR 66 | Temp 97.9°F | Ht 67.5 in | Wt 143.6 lb

## 2022-07-01 DIAGNOSIS — M810 Age-related osteoporosis without current pathological fracture: Secondary | ICD-10-CM | POA: Diagnosis not present

## 2022-07-01 DIAGNOSIS — K219 Gastro-esophageal reflux disease without esophagitis: Secondary | ICD-10-CM | POA: Diagnosis not present

## 2022-07-01 DIAGNOSIS — C785 Secondary malignant neoplasm of large intestine and rectum: Secondary | ICD-10-CM | POA: Diagnosis not present

## 2022-07-01 DIAGNOSIS — C50911 Malignant neoplasm of unspecified site of right female breast: Secondary | ICD-10-CM | POA: Diagnosis not present

## 2022-07-01 NOTE — Patient Instructions (Signed)
Tdap at local pharmacy.

## 2022-07-01 NOTE — Progress Notes (Signed)
Careteam: Patient Care Team: Lauree Chandler, NP as PCP - General (Geriatric Medicine) Volanda Napoleon, MD as Medical Oncologist (Oncology) Stark Klein, MD as Consulting Physician (General Surgery) Jari Pigg, MD as Consulting Physician (Dermatology) Dian Queen, MD as Consulting Physician (Obstetrics and Gynecology)  PLACE OF SERVICE:  [facility] Directive information Does Patient Have a Medical Advance Directive?: Yes, Type of Advance Directive: [place];Living will, Does patient want to make changes to medical advance directive?: No - Patient declined  Allergies  Allergen Reactions   Kisqali (200 Mg Dose) [Ribociclib Succ (200 Mg Dose)] Itching and Rash    scratchy throat.    Chief Complaint  Patient presents with   Medical Management of Chronic Issues    6 month follow-up. Discuss need for shingrix and td/tdap or post pone if patient refuses or is not a candidate. [facility] verified. No recent labs to discuss      HPI: Patient is a 71 y.o. female for routine follow up.   Hx of breast cancer with mets to the colon- She has followed up with oncologist earlier this month- tumor marker is now normal. She continues to take femara and vereio. Plan to have another PET scan done in March and will also follow up with oncology then.  She has mamogram scheduled.   She spent the holidays with her family in [place] and plans to go back. Went last year for a month in [place] and stayed on the beach.   GERD- she was placed on protonix but was making legs ache- she started decreasing protonix and now taking pepcid.  She has not had an recurrence.   Bowels moving well. No more diarrhea at this time.   Osteoporosis- continues on prolia with vit d   Vaccines give her a lot of side effects. If she takes a vaccine she has to be okay with feeling ill for at least a day.  Review of Systems:  Review of Systems  Constitutional:  Negative for chills,  fever and weight loss.  HENT:  Negative for tinnitus.   Respiratory:  Negative for cough, sputum production and shortness of breath.   Cardiovascular:  Negative for chest pain, palpitations and leg swelling.  Gastrointestinal:  Negative for abdominal pain, constipation, diarrhea and heartburn.  Genitourinary:  Negative for dysuria, frequency and urgency.  Musculoskeletal:  Negative for back pain, falls, joint pain and myalgias.  Skin: Negative.   Neurological:  Negative for dizziness and headaches.  Psychiatric/Behavioral:  Negative for depression and memory loss. The patient does not have insomnia.     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, right ([place]) 97/67/3419   Complication of anesthesia    very sensitive to meds   Goals of care, counseling/discussion 08/30/2018   History of bone density study 09/29/2020   History of colonoscopy 11/24/2020   History of EKG 10/10/2018   History of mammogram 01/17/2020   History of MRI    Breast (03/12/2020), Brain (06/13/2019) and Knee (02/23/2018& 05/22/2018)   Osteoporosis    Personal history of chemotherapy    Shingles    Skin cancer    basal cell x 2 (on back and on nose)   Tinnitus of left ear    Past Surgical History:  Procedure Laterality Date   bone cyst removed Right 1978   BREAST BIOPSY Left 08/28/2018   COLON RESECTION Right 09/14/2018   Procedure: LAPAROSCOPIC  ASSISTED RIGHT COLECTOMY;  Surgeon: Fanny Skates, MD;  Location: [facility];  Service: General;  Laterality: Right;   COLONOSCOPY     LAPAROSCOPIC RIGHT COLECTOMY  09/14/2018   Social History:   reports that she has never smoked. She has never used smokeless tobacco. She reports current alcohol use. She reports that she does not use drugs.  Family History  Problem Relation Age of Onset   Dementia Mother    Congestive Heart Failure Father    Early death Paternal Aunt    Dementia Maternal Grandmother    Breast cancer Neg  Hx     Medications: Patient's Medications  New Prescriptions   No medications on file  Previous Medications   ABEMACICLIB (VERZENIO) 100 MG TABLET    Take 1 tablet (100 mg total) by mouth daily.   CARBOXYMETH-GLYCERIN-POLYSORB (REFRESH OPTIVE ADVANCED) 0.5-1-0.5 % SOLN    Apply 1 drop to eye daily as needed (Both eyes).   CHOLECALCIFEROL (VITAMIN D3 PO)    Take 2,000 Units by mouth daily.   DENOSUMAB (PROLIA) 60 MG/ML SOSY INJECTION    Inject 60 mg into the skin every 6 (six) months.   LETROZOLE (FEMARA) 2.5 MG TABLET    TAKE 1 TABLET BY MOUTH ONCE A DAY   LOPERAMIDE (IMODIUM) 2 MG CAPSULE    Take 2 mg by mouth as needed.   PANTOPRAZOLE (PROTONIX) 40 MG TABLET    Take 1 tablet (40 mg total) by mouth daily.   PSYLLIUM (METAMUCIL) 48.57 % POWD    1 packet with 8 ounces of liquid as needed  Modified Medications   No medications on file  Discontinued Medications   No medications on file    Physical Exam:  Vitals:   06/30/22 1602  Height: 5' 7.5" (1.715 m)   Body mass index is 21.47 kg/m. Wt Readings from Last 3 Encounters:  06/16/22 139 lb 1.9 oz (63.1 kg)  05/16/22 141 lb (64 kg)  04/25/22 138 lb 8 oz (62.8 kg)    Physical Exam Constitutional:      General: She is not in acute distress.    Appearance: She is well-developed. She is not diaphoretic.  HENT:     Head: Normocephalic and atraumatic.     Mouth/Throat:     Pharynx: No oropharyngeal exudate.  Eyes:     Conjunctiva/sclera: Conjunctivae normal.     Pupils: Pupils are equal, round, and reactive to light.  Cardiovascular:     Rate and Rhythm: Normal rate and regular rhythm.     Heart sounds: Normal heart sounds.  Pulmonary:     Effort: Pulmonary effort is normal.     Breath sounds: Normal breath sounds.  Abdominal:     General: Bowel sounds are normal.     Palpations: Abdomen is soft.  Musculoskeletal:     Cervical back: Normal range of motion and neck supple.     Right lower leg: No edema.     Left lower  leg: No edema.  Skin:    General: Skin is warm and dry.  Neurological:     Mental Status: She is alert.  Psychiatric:        Mood and Affect: Mood normal.     Labs reviewed: Basic Metabolic Panel: Recent Labs    11/12/21 1027 03/17/22 1020 06/16/22 0957  NA 137 137 137  K 4.1 4.5 4.3  CL 105 101 103  CO2 '27 30 28  '$ GLUCOSE 85 106* 79  BUN '17 18 17  '$ CREATININE 0.95 1.17* 1.13*  CALCIUM 9.4 10.0 9.3   Liver Function Tests: Recent Labs    11/12/21 1027 03/17/22 1020 06/16/22 0957  AST '19 17 21  '$ ALT '14 11 13  '$ ALKPHOS 59 53 57  BILITOT 0.3 0.4 0.3  PROT 6.4* 7.1 6.9  ALBUMIN 3.9 4.0 4.2   No results for input(s): "LIPASE", "AMYLASE" in the last 8760 hours. No results for input(s): "AMMONIA" in the last 8760 hours. CBC: Recent Labs    11/12/21 1027 03/17/22 1020 06/16/22 0957  WBC 4.6 3.7* 4.1  NEUTROABS 2.8 1.9 2.3  HGB 11.9* 12.3 12.3  HCT 35.7* 36.6 36.1  MCV 100.0 98.4 99.2  PLT 113* 85* 98*   Lipid Panel: No results for input(s): "CHOL", "HDL", "LDLCALC", "TRIG", "CHOLHDL", "LDLDIRECT" in the last 8760 hours. TSH: No results for input(s): "TSH" in the last 8760 hours. A1C: Lab Results  Component Value Date   HGBA1C 5.4 09/03/2018     Assessment/Plan 1. Gastroesophageal reflux disease without esophagitis -has improved with dietary and lifestyle modifications, continues to titrate off protonix.   2. 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  3. Osteoporosis without current pathological fracture, unspecified osteoporosis type -continues on prolia and vit D   Return in about 6 months (around 12/31/2022) for for routine follow up . [facility] [employer]. [place], [facility] [phone]

## 2022-07-06 ENCOUNTER — Encounter: Payer: Self-pay | Admitting: Hematology & Oncology

## 2022-07-06 ENCOUNTER — Other Ambulatory Visit: Payer: Self-pay

## 2022-07-06 ENCOUNTER — Other Ambulatory Visit (HOSPITAL_COMMUNITY): Payer: Self-pay

## 2022-07-08 ENCOUNTER — Telehealth: Payer: Self-pay | Admitting: *Deleted

## 2022-07-08 NOTE — Telephone Encounter (Signed)
Patient dropped off [facility], general practice Form for Martha Osborne to fill out and sign.  Filled out and attached Current Medication list and placed in [facility] folder to review and sign.   When Completed Requesting form to be faxed to [facility] [phone]

## 2022-07-08 NOTE — Telephone Encounter (Signed)
Form filled out and signed.  Faxed to [facility] [phone] Sent copy to scanning.

## 2022-07-12 ENCOUNTER — Other Ambulatory Visit (HOSPITAL_COMMUNITY): Payer: Self-pay

## 2022-08-09 ENCOUNTER — Other Ambulatory Visit (HOSPITAL_COMMUNITY): Payer: Self-pay

## 2022-08-17 ENCOUNTER — Other Ambulatory Visit: Payer: Self-pay

## 2022-08-22 ENCOUNTER — Ambulatory Visit
Admission: RE | Admit: 2022-08-22 | Discharge: 2022-08-22 | Disposition: A | Discharge status: Home / Self Care | Payer: Medicare Other | Source: Ambulatory Visit | Attending: General Surgery | Admitting: General Surgery

## 2022-08-22 DIAGNOSIS — Z853 Personal history of malignant neoplasm of breast: Secondary | ICD-10-CM | POA: Diagnosis not present

## 2022-08-22 DIAGNOSIS — Z170 Estrogen receptor positive status [ER+]: Secondary | ICD-10-CM

## 2022-08-30 DIAGNOSIS — H2511 Age-related nuclear cataract, right eye: Secondary | ICD-10-CM | POA: Diagnosis not present

## 2022-08-30 DIAGNOSIS — H25043 Posterior subcapsular polar age-related cataract, bilateral: Secondary | ICD-10-CM | POA: Diagnosis not present

## 2022-08-30 DIAGNOSIS — H18413 Arcus senilis, bilateral: Secondary | ICD-10-CM | POA: Diagnosis not present

## 2022-08-30 DIAGNOSIS — H25013 Cortical age-related cataract, bilateral: Secondary | ICD-10-CM | POA: Diagnosis not present

## 2022-08-30 DIAGNOSIS — H2513 Age-related nuclear cataract, bilateral: Secondary | ICD-10-CM | POA: Diagnosis not present

## 2022-09-05 ENCOUNTER — Encounter (HOSPITAL_COMMUNITY)
Admission: RE | Admit: 2022-09-05 | Discharge: 2022-09-05 | Disposition: A | Discharge status: Home / Self Care | Payer: Medicare Other | Source: Ambulatory Visit | Attending: Hematology & Oncology | Admitting: Hematology & Oncology

## 2022-09-05 DIAGNOSIS — C50911 Malignant neoplasm of unspecified site of right female breast: Secondary | ICD-10-CM | POA: Diagnosis not present

## 2022-09-05 DIAGNOSIS — C785 Secondary malignant neoplasm of large intestine and rectum: Secondary | ICD-10-CM | POA: Diagnosis not present

## 2022-09-05 DIAGNOSIS — C50919 Malignant neoplasm of unspecified site of unspecified female breast: Secondary | ICD-10-CM | POA: Diagnosis not present

## 2022-09-05 LAB — GLUCOSE, CAPILLARY: Glucose-Capillary: 107 mg/dL — ABNORMAL HIGH (ref 70–99)

## 2022-09-05 MED ORDER — FLUDEOXYGLUCOSE F - 18 (FDG) INJECTION
7.1200 | Freq: Once | INTRAVENOUS | Status: AC | PRN
  Administered 2022-09-05: 7.12 via INTRAVENOUS

## 2022-09-06 ENCOUNTER — Other Ambulatory Visit (HOSPITAL_COMMUNITY): Payer: Self-pay

## 2022-09-09 ENCOUNTER — Other Ambulatory Visit (HOSPITAL_COMMUNITY): Payer: Self-pay

## 2022-09-12 ENCOUNTER — Other Ambulatory Visit: Payer: Self-pay

## 2022-09-13 ENCOUNTER — Other Ambulatory Visit (HOSPITAL_COMMUNITY): Payer: Self-pay

## 2022-10-05 ENCOUNTER — Other Ambulatory Visit (HOSPITAL_COMMUNITY): Payer: Self-pay

## 2022-10-05 ENCOUNTER — Other Ambulatory Visit: Payer: Self-pay | Admitting: Hematology & Oncology

## 2022-10-06 ENCOUNTER — Other Ambulatory Visit: Payer: Self-pay

## 2022-10-06 ENCOUNTER — Other Ambulatory Visit (HOSPITAL_COMMUNITY): Payer: Self-pay

## 2022-10-06 MED ORDER — ABEMACICLIB 100 MG PO TABS
100.0000 mg | ORAL_TABLET | Freq: Every day | ORAL | 6 refills | Status: DC
  Filled 2022-10-06 – 2022-10-07 (×2): qty 28, 28d supply, fill #0
  Filled 2022-11-02: qty 28, 28d supply, fill #1
  Filled 2022-11-29: qty 28, 28d supply, fill #2
  Filled 2022-12-28: qty 28, 28d supply, fill #3
  Filled 2023-01-25: qty 28, 28d supply, fill #4
  Filled 2023-02-22: qty 28, 28d supply, fill #5
  Filled 2023-03-20: qty 28, 28d supply, fill #6

## 2022-10-07 ENCOUNTER — Other Ambulatory Visit: Payer: Self-pay

## 2022-10-07 ENCOUNTER — Other Ambulatory Visit (HOSPITAL_COMMUNITY): Payer: Self-pay

## 2022-10-10 ENCOUNTER — Other Ambulatory Visit: Payer: Self-pay

## 2022-10-10 ENCOUNTER — Inpatient Hospital Stay: Payer: Medicare Other

## 2022-10-10 ENCOUNTER — Inpatient Hospital Stay: Payer: Medicare Other | Attending: Hematology & Oncology

## 2022-10-10 ENCOUNTER — Encounter: Payer: Self-pay | Admitting: Hematology & Oncology

## 2022-10-10 ENCOUNTER — Inpatient Hospital Stay (HOSPITAL_BASED_OUTPATIENT_CLINIC_OR_DEPARTMENT_OTHER): Payer: Medicare Other | Admitting: Hematology & Oncology

## 2022-10-10 VITALS — BP 119/60 | HR 70 | Temp 98.3°F | Resp 17 | Ht 66.0 in | Wt 142.0 lb

## 2022-10-10 DIAGNOSIS — Z170 Estrogen receptor positive status [ER+]: Secondary | ICD-10-CM | POA: Diagnosis not present

## 2022-10-10 DIAGNOSIS — Z79811 Long term (current) use of aromatase inhibitors: Secondary | ICD-10-CM | POA: Diagnosis not present

## 2022-10-10 DIAGNOSIS — C50912 Malignant neoplasm of unspecified site of left female breast: Secondary | ICD-10-CM | POA: Diagnosis not present

## 2022-10-10 DIAGNOSIS — M810 Age-related osteoporosis without current pathological fracture: Secondary | ICD-10-CM | POA: Diagnosis not present

## 2022-10-10 DIAGNOSIS — C50911 Malignant neoplasm of unspecified site of right female breast: Secondary | ICD-10-CM

## 2022-10-10 DIAGNOSIS — C785 Secondary malignant neoplasm of large intestine and rectum: Secondary | ICD-10-CM | POA: Diagnosis not present

## 2022-10-10 LAB — CBC WITH DIFFERENTIAL (CANCER CENTER ONLY)
Abs Immature Granulocytes: 0.02 10*3/uL (ref 0.00–0.07)
Basophils Absolute: 0 10*3/uL (ref 0.0–0.1)
Basophils Relative: 1 %
Eosinophils Absolute: 0.1 10*3/uL (ref 0.0–0.5)
Eosinophils Relative: 1 %
HCT: 39.3 % (ref 36.0–46.0)
Hemoglobin: 13 g/dL (ref 12.0–15.0)
Immature Granulocytes: 0 %
Lymphocytes Relative: 36 %
Lymphs Abs: 1.6 10*3/uL (ref 0.7–4.0)
MCH: 32.1 pg (ref 26.0–34.0)
MCHC: 33.1 g/dL (ref 30.0–36.0)
MCV: 97 fL (ref 80.0–100.0)
Monocytes Absolute: 0.3 10*3/uL (ref 0.1–1.0)
Monocytes Relative: 6 %
Neutro Abs: 2.5 10*3/uL (ref 1.7–7.7)
Neutrophils Relative %: 56 %
Platelet Count: 106 10*3/uL — ABNORMAL LOW (ref 150–400)
RBC: 4.05 MIL/uL (ref 3.87–5.11)
RDW: 13.1 % (ref 11.5–15.5)
WBC Count: 4.5 10*3/uL (ref 4.0–10.5)
nRBC: 0 % (ref 0.0–0.2)

## 2022-10-10 LAB — CMP (CANCER CENTER ONLY)
ALT: 15 U/L (ref 0–44)
AST: 22 U/L (ref 15–41)
Albumin: 4.1 g/dL (ref 3.5–5.0)
Alkaline Phosphatase: 60 U/L (ref 38–126)
Anion gap: 9 (ref 5–15)
BUN: 17 mg/dL (ref 8–23)
CO2: 28 mmol/L (ref 22–32)
Calcium: 9.3 mg/dL (ref 8.9–10.3)
Chloride: 102 mmol/L (ref 98–111)
Creatinine: 1.13 mg/dL — ABNORMAL HIGH (ref 0.44–1.00)
GFR, Estimated: 52 mL/min — ABNORMAL LOW (ref >60)
Glucose, Bld: 86 mg/dL (ref 70–99)
Potassium: 3.9 mmol/L (ref 3.5–5.1)
Sodium: 139 mmol/L (ref 135–145)
Total Bilirubin: 0.3 mg/dL (ref 0.3–1.2)
Total Protein: 7.1 g/dL (ref 6.5–8.1)

## 2022-10-10 LAB — LACTATE DEHYDROGENASE: LDH: 108 U/L (ref 98–192)

## 2022-10-10 MED ORDER — DENOSUMAB 60 MG/ML ~~LOC~~ SOSY
60.0000 mg | PREFILLED_SYRINGE | Freq: Once | SUBCUTANEOUS | Status: AC
  Administered 2022-10-10: 60 mg via SUBCUTANEOUS
  Filled 2022-10-10: qty 1

## 2022-10-10 NOTE — Patient Instructions (Signed)
Denosumab Injection (Osteoporosis) What is this medication? DENOSUMAB (den oh SUE mab) prevents and treats osteoporosis. It works by making your bones stronger and less likely to break (fracture). It is a monoclonal antibody. This medicine may be used for other purposes; ask your health care provider or pharmacist if you have questions. COMMON BRAND NAME(S): Prolia What should I tell my care team before I take this medication? They need to know if you have any of these conditions: Dental or gum disease, or plan to have dental surgery or a tooth pulled Infection Kidney disease Low levels of calcium or vitamin D in your blood On dialysis Poor nutrition Skin conditions Thyroid disease, or have had thyroid or parathyroid surgery Trouble absorbing minerals in your stomach or intestine An unusual or allergic reaction to denosumab, other medications, foods, dyes, or preservatives Pregnant or trying to get pregnant Breastfeeding How should I use this medication? This medication is injected under the skin. It is given by your care team in a hospital or clinic setting. A special MedGuide will be given to you before each treatment. Be sure to read this information carefully each time. Talk to your care team about the use of this medication in children. Special care may be needed. Overdosage: If you think you have taken too much of this medicine contact a poison control center or emergency room at once. NOTE: This medicine is only for you. Do not share this medicine with others. What if I miss a dose? Keep appointments for follow-up doses. It is important not to miss your dose. Call your care team if you are unable to keep an appointment. What may interact with this medication? Do not take this medication with any of the following: Other medications that contain denosumab This medication may also interact with the following: Medications that lower your chance of fighting infection Steroid  medications, such as prednisone or cortisone This list may not describe all possible interactions. Give your health care provider a list of all the medicines, herbs, non-prescription drugs, or dietary supplements you use. Also tell them if you smoke, drink alcohol, or use illegal drugs. Some items may interact with your medicine. What should I watch for while using this medication? Your condition will be monitored carefully while you are receiving this medication. You may need blood work while taking this medication. This medication may increase your risk of getting an infection. Call your care team for advice if you get a fever, chills, sore throat, or other symptoms of a cold or flu. Do not treat yourself. Try to avoid being around people who are sick. Tell your dentist and dental surgeon that you are taking this medication. You should not have major dental surgery while on this medication. See your dentist to have a dental exam and fix any dental problems before starting this medication. Take good care of your teeth while on this medication. Make sure you see your dentist for regular follow-up appointments. You should make sure you get enough calcium and vitamin D while you are taking this medication. Discuss the foods you eat and the vitamins you take with your care team. Talk to your care team if you are pregnant or think you might be pregnant. This medication can cause serious birth defects if taken during pregnancy and for 5 months after the last dose. You will need a negative pregnancy test before starting this medication. Contraception is recommended while taking this medication and for 5 months after the last dose. Your care   team can help you find the option that works for you. Talk to your care team before breastfeeding. Changes to your treatment plan may be needed. What side effects may I notice from receiving this medication? Side effects that you should report to your care team as soon as  possible: Allergic reactions--skin rash, itching, hives, swelling of the face, lips, tongue, or throat Infection--fever, chills, cough, sore throat, wounds that don't heal, pain or trouble when passing urine, general feeling of discomfort or being unwell Low calcium level--muscle pain or cramps, confusion, tingling, or numbness in the hands or feet Osteonecrosis of the jaw--pain, swelling, or redness in the mouth, numbness of the jaw, poor healing after dental work, unusual discharge from the mouth, visible bones in the mouth Severe bone, joint, or muscle pain Skin infection--skin redness, swelling, warmth, or pain Side effects that usually do not require medical attention (report these to your care team if they continue or are bothersome): Back pain Headache Joint pain Muscle pain Pain in the hands, arms, legs, or feet Runny or stuffy nose Sore throat This list may not describe all possible side effects. Call your doctor for medical advice about side effects. You may report side effects to FDA at 1-800-FDA-1088. Where should I keep my medication? This medication is given in a hospital or clinic. It will not be stored at home. NOTE: This sheet is a summary. It may not cover all possible information. If you have questions about this medicine, talk to your doctor, pharmacist, or health care provider.  2023 Elsevier/Gold Standard (2021-11-01 00:00:00)  

## 2022-10-10 NOTE — Progress Notes (Signed)
Hematology and Oncology Follow Up Visit  Martha Osborne [id number] January 10, 1951 72 y.o. 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 daily Ribociclib 600 mg p.o. daily (21/7) -- d/c due to skin rash Prolia 60 mg IM q. 6 months -- next dose on 03/2023       Verzenio 100 mg po day - started on 06/12/2019 -changed 10/2021                    Interim History:  Martha Osborne is back for follow-up.  We last saw her back in December.  Since then, she been doing pretty well.  As always, the do [place] of the [place].  The boys have a nice time down in [place].  It does not sound like the weather was all that great.  She did get her husband just got back from [place] .  There was a tennis department that they saw down there.  She did have a PET scan done in early March.  PET scan did not show any evidence of obvious metastatic disease.  Her last CA 27.29 was holding steady at 21.3.  She has had no nausea or vomiting.  There is no change in bowel or bladder habits.  Unfortunately, she will need cataract surgery.  The first 1 will be done in May and the second will be done in June.  She has had no rashes.  The Verzenio seems to be working well for her at 100 mg a day.  Overall, she has had no problems with COVID.  Her performance status is ECOG 1. .    Medications:  Current Outpatient Medications:    abemaciclib (VERZENIO) 100 MG tablet, Take 1 tablet (100 mg total) by mouth daily., Disp: 30 tablet, Rfl: 6   Carboxymeth-Glycerin-Polysorb (REFRESH OPTIVE ADVANCED) 0.5-1-0.5 % SOLN, Apply 1 drop to eye daily as needed (Both eyes)., Disp: , Rfl:    Cholecalciferol (VITAMIN D3 PO), Take 2,000 Units by mouth daily., Disp: , Rfl:    denosumab (PROLIA) 60 MG/ML SOSY injection, Inject 60 mg into  the skin every 6 (six) months., Disp: , Rfl:    letrozole (FEMARA) 2.5 MG tablet, TAKE 1 TABLET BY MOUTH ONCE A DAY, Disp: 90 tablet, Rfl: 6   loperamide (IMODIUM) 2 MG capsule, Take 2 mg by mouth as needed., Disp: , Rfl:    ondansetron (ZOFRAN) 4 MG tablet, Take 4 mg by mouth every 8 (eight) hours as needed., Disp: , Rfl:    Psyllium (METAMUCIL) 48.57 % POWD, 1 packet with 8 ounces of liquid as needed, Disp: , Rfl:   Allergies:  Allergies  Allergen Reactions   Kisqali (200 Mg Dose) [Ribociclib Succ (200 Mg Dose)] Itching and Rash    scratchy throat.    Past Medical History, Surgical history, Social history, and Family History were reviewed and updated.  Review of Systems: Review of Systems  Constitutional: Negative.   HENT:  Negative.   Eyes: Negative.   Respiratory: Negative.   Cardiovascular: Negative.   Gastrointestinal: Positive for abdominal pain.  Endocrine: Negative.   Genitourinary: Negative.    Musculoskeletal: Negative.   Skin: Negative.   Neurological: Negative.   Hematological: Negative.   Psychiatric/Behavioral: Negative.     Physical Exam:  height is 5\' 6"  (1.676 m) and weight is 142 lb (  64.4 kg). Her oral temperature is 98.3 F (36.8 C). Her blood pressure is 119/60 and her pulse is 70. Her respiration is 17 and oxygen saturation is 100%.   Wt Readings from Last 3 Encounters:  10/10/22 142 lb (64.4 kg)  06/30/22 143 lb 9.6 oz (65.1 kg)  06/16/22 139 lb 1.9 oz (63.1 kg)    Physical Exam Vitals signs reviewed.  Constitutional:      Comments: Breast exam bilaterally shows no breast masses.  She has no nipple discharge bilaterally.  She has no breast swelling or erythema.  There is no bilateral axillary adenopathy.  HENT:     Head: Normocephalic and atraumatic.  Eyes:     Pupils: Pupils are equal, round, and reactive to light.  Neck:     Musculoskeletal: Normal range of motion.  Cardiovascular:     Rate and Rhythm: Normal rate and regular rhythm.      Heart sounds: Normal heart sounds.  Pulmonary:     Effort: Pulmonary effort is normal.     Breath sounds: Normal breath sounds.  Abdominal:     General: Bowel sounds are normal.     Palpations: Abdomen is soft.     Comments: Abdominal exam shows the healing laparotomy scar.  She has no swelling.  There is no erythema about the surgical site.  She has been tenderness to palpation in the right lower quadrant.  No masses noted.  Bowel sounds are present.  There is no palpable liver or spleen tip.  Musculoskeletal: Normal range of motion.        General: No tenderness or deformity.  Lymphadenopathy:     Cervical: No cervical adenopathy.  Skin:    General: Skin is warm and dry.     Findings: No erythema or rash.  Neurological:     Mental Status: She is alert and oriented to person, place, and time.  Psychiatric:        Behavior: Behavior normal.        Thought Content: Thought content normal.        Judgment: Judgment normal.      Lab Results  Component Value Date   WBC 4.5 10/10/2022   HGB 13.0 10/10/2022   HCT 39.3 10/10/2022   MCV 97.0 10/10/2022   PLT 106 (L) 10/10/2022     Chemistry      Component Value Date/Time   NA 139 10/10/2022 0813   K 3.9 10/10/2022 0813   CL 102 10/10/2022 0813   CO2 28 10/10/2022 0813   BUN 17 10/10/2022 0813   CREATININE 1.13 (H) 10/10/2022 0813      Component Value Date/Time   CALCIUM 9.3 10/10/2022 0813   ALKPHOS 60 10/10/2022 0813   AST 22 10/10/2022 0813   ALT 15 10/10/2022 0813   BILITOT 0.3 10/10/2022 0813       Impression and Plan: Martha Osborne is a 72 year old postmenopausal female.  She has metastatic lobular carcinoma of the left breast.  We finally found her primary after doing a breast MRI.  She had resection of the colonic mass.  She had multiple positive lymph nodes.   Again, she is doing incredibly well with the Femara/Verzenio.  I am very happy about this.  Her quality life is doing well.  She is able to do what she  would like.  We will set up another PET scan in July.  I think this is reasonable.  She gets her Prolia today.  We will plan to see  her back in July.   Christin Bach, MD

## 2022-10-11 LAB — CANCER ANTIGEN 27.29: CA 27.29: 19.5 U/mL (ref 0.0–38.6)

## 2022-10-17 ENCOUNTER — Other Ambulatory Visit (HOSPITAL_BASED_OUTPATIENT_CLINIC_OR_DEPARTMENT_OTHER): Payer: Medicare Other

## 2022-10-17 ENCOUNTER — Ambulatory Visit (HOSPITAL_BASED_OUTPATIENT_CLINIC_OR_DEPARTMENT_OTHER)
Admission: RE | Admit: 2022-10-17 | Discharge: 2022-10-17 | Disposition: A | Discharge status: Home / Self Care | Payer: Medicare Other | Source: Ambulatory Visit | Attending: Hematology & Oncology | Admitting: Hematology & Oncology

## 2022-10-17 DIAGNOSIS — C785 Secondary malignant neoplasm of large intestine and rectum: Secondary | ICD-10-CM | POA: Diagnosis not present

## 2022-10-17 DIAGNOSIS — C50911 Malignant neoplasm of unspecified site of right female breast: Secondary | ICD-10-CM | POA: Diagnosis not present

## 2022-10-17 DIAGNOSIS — M810 Age-related osteoporosis without current pathological fracture: Secondary | ICD-10-CM | POA: Diagnosis not present

## 2022-10-17 DIAGNOSIS — M8589 Other specified disorders of bone density and structure, multiple sites: Secondary | ICD-10-CM | POA: Diagnosis not present

## 2022-10-18 ENCOUNTER — Encounter: Payer: Self-pay | Admitting: Hematology & Oncology

## 2022-11-02 ENCOUNTER — Other Ambulatory Visit (HOSPITAL_COMMUNITY): Payer: Self-pay

## 2022-11-04 ENCOUNTER — Other Ambulatory Visit (HOSPITAL_COMMUNITY): Payer: Self-pay

## 2022-11-11 DIAGNOSIS — D0471 Carcinoma in situ of skin of right lower limb, including hip: Secondary | ICD-10-CM | POA: Diagnosis not present

## 2022-11-11 DIAGNOSIS — D485 Neoplasm of uncertain behavior of skin: Secondary | ICD-10-CM | POA: Diagnosis not present

## 2022-11-21 DIAGNOSIS — H2513 Age-related nuclear cataract, bilateral: Secondary | ICD-10-CM | POA: Diagnosis not present

## 2022-11-21 DIAGNOSIS — H2511 Age-related nuclear cataract, right eye: Secondary | ICD-10-CM | POA: Diagnosis not present

## 2022-11-21 DIAGNOSIS — H52201 Unspecified astigmatism, right eye: Secondary | ICD-10-CM | POA: Diagnosis not present

## 2022-11-22 DIAGNOSIS — H25012 Cortical age-related cataract, left eye: Secondary | ICD-10-CM | POA: Diagnosis not present

## 2022-11-22 DIAGNOSIS — H2512 Age-related nuclear cataract, left eye: Secondary | ICD-10-CM | POA: Diagnosis not present

## 2022-11-22 DIAGNOSIS — H25042 Posterior subcapsular polar age-related cataract, left eye: Secondary | ICD-10-CM | POA: Diagnosis not present

## 2022-11-29 ENCOUNTER — Other Ambulatory Visit (HOSPITAL_COMMUNITY): Payer: Self-pay

## 2022-11-29 ENCOUNTER — Other Ambulatory Visit: Payer: Self-pay | Admitting: Hematology & Oncology

## 2022-11-29 DIAGNOSIS — C50911 Malignant neoplasm of unspecified site of right female breast: Secondary | ICD-10-CM

## 2022-11-29 MED ORDER — LETROZOLE 2.5 MG PO TABS
2.5000 mg | ORAL_TABLET | Freq: Every day | ORAL | 6 refills | Status: DC
  Filled 2022-11-29: qty 90, 90d supply, fill #0
  Filled 2023-02-22: qty 90, 90d supply, fill #1
  Filled 2023-05-10: qty 90, 90d supply, fill #2
  Filled 2023-08-10: qty 90, 90d supply, fill #3
  Filled 2023-11-07: qty 90, 90d supply, fill #4

## 2022-11-30 ENCOUNTER — Other Ambulatory Visit (HOSPITAL_COMMUNITY): Payer: Self-pay

## 2022-12-01 ENCOUNTER — Other Ambulatory Visit (HOSPITAL_COMMUNITY): Payer: Self-pay

## 2022-12-05 DIAGNOSIS — H2512 Age-related nuclear cataract, left eye: Secondary | ICD-10-CM | POA: Diagnosis not present

## 2022-12-05 DIAGNOSIS — H52202 Unspecified astigmatism, left eye: Secondary | ICD-10-CM | POA: Diagnosis not present

## 2022-12-09 DIAGNOSIS — L578 Other skin changes due to chronic exposure to nonionizing radiation: Secondary | ICD-10-CM | POA: Diagnosis not present

## 2022-12-09 DIAGNOSIS — D2271 Melanocytic nevi of right lower limb, including hip: Secondary | ICD-10-CM | POA: Diagnosis not present

## 2022-12-09 DIAGNOSIS — D0471 Carcinoma in situ of skin of right lower limb, including hip: Secondary | ICD-10-CM | POA: Diagnosis not present

## 2022-12-09 DIAGNOSIS — D225 Melanocytic nevi of trunk: Secondary | ICD-10-CM | POA: Diagnosis not present

## 2022-12-09 DIAGNOSIS — Z85828 Personal history of other malignant neoplasm of skin: Secondary | ICD-10-CM | POA: Diagnosis not present

## 2022-12-09 DIAGNOSIS — D2372 Other benign neoplasm of skin of left lower limb, including hip: Secondary | ICD-10-CM | POA: Diagnosis not present

## 2022-12-09 DIAGNOSIS — L821 Other seborrheic keratosis: Secondary | ICD-10-CM | POA: Diagnosis not present

## 2022-12-15 DIAGNOSIS — M25562 Pain in left knee: Secondary | ICD-10-CM | POA: Diagnosis not present

## 2022-12-15 DIAGNOSIS — S86112A Strain of other muscle(s) and tendon(s) of posterior muscle group at lower leg level, left leg, initial encounter: Secondary | ICD-10-CM | POA: Diagnosis not present

## 2022-12-28 ENCOUNTER — Other Ambulatory Visit (HOSPITAL_COMMUNITY): Payer: Self-pay

## 2022-12-29 ENCOUNTER — Ambulatory Visit (INDEPENDENT_AMBULATORY_CARE_PROVIDER_SITE_OTHER): Payer: Medicare Other | Admitting: Adult Health

## 2022-12-29 ENCOUNTER — Encounter: Payer: Self-pay | Admitting: Adult Health

## 2022-12-29 ENCOUNTER — Telehealth: Payer: Self-pay

## 2022-12-29 ENCOUNTER — Ambulatory Visit (HOSPITAL_BASED_OUTPATIENT_CLINIC_OR_DEPARTMENT_OTHER)
Admission: RE | Admit: 2022-12-29 | Discharge: 2022-12-29 | Disposition: A | Discharge status: Home / Self Care | Payer: Medicare Other | Source: Ambulatory Visit | Attending: Adult Health | Admitting: Adult Health

## 2022-12-29 ENCOUNTER — Other Ambulatory Visit: Payer: Self-pay

## 2022-12-29 VITALS — BP 137/78 | HR 87 | Temp 97.3°F | Resp 18 | Ht 66.0 in | Wt 141.4 lb

## 2022-12-29 DIAGNOSIS — M79605 Pain in left leg: Secondary | ICD-10-CM | POA: Insufficient documentation

## 2022-12-29 DIAGNOSIS — M79662 Pain in left lower leg: Secondary | ICD-10-CM | POA: Diagnosis not present

## 2022-12-29 NOTE — Telephone Encounter (Signed)
Patient was advised to come in office at 2pm still.

## 2022-12-29 NOTE — Telephone Encounter (Signed)
Thank you for calling her I will go ahead and see her.

## 2022-12-29 NOTE — Telephone Encounter (Signed)
Called patient and she reports pain from bock of left leg to knee that started on yesterday, 12/28/22. She reports placing a knee brace on and went walking to see if it would help out with the pain. States pain w/ swelling occurred after the walk and placed ice on her knee. She report going to [facility] on 12/15/22 for similar pain and they gave the knee brace and did x-ray and couldn't find anything abnormal. Patient reports she just want to rule out no blood clot due to the swelling last night.

## 2022-12-29 NOTE — Progress Notes (Signed)
[facility] clinic  Provider:  Peggye Ley, ANP [facility] [phone]    Goals of Care:     12/29/2022    1:44 PM  Advanced Directives  Does Patient Have a Medical Advance Directive? Yes  Type of Advance Directive Healthcare Power of Attorney  Does patient want to make changes to medical advance directive? No - Patient declined  Copy of Healthcare Power of Attorney in Chart? Yes - validated most recent copy scanned in chart (See row information)     Chief Complaint  Patient presents with  . Acute Visit    Left leg possible blood clot serious pain    HPI: Patient is a 72 y.o. female seen today for an acute visit for left leg pain. She had trouble going up steps and had aching when sitting of the left leg behind the knee area two weeks ago. She did not have a fall or acute injury. Went to see the orthopedist and had an xray of the left knee that did not show anything acute per her report. No redness, swelling, or warmth. There was a thought that it was a muscle strain and she was given prednisone and the pain improved. Since 6/26  pain behind the left knee and upper calf area is much worse. She is concerned for a blood clot. 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   Denies any sob or cp.   Past Medical History:  Diagnosis Date  . 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 Complication of anesthesia    very sensitive to meds  . Goals of care, counseling/discussion 08/30/2018  . History of bone density study 09/29/2020  . History of colonoscopy 11/24/2020  . History of EKG 10/10/2018  . History of mammogram 01/17/2020  . History of MRI    Breast (03/12/2020), Brain (06/13/2019) and Knee (02/23/2018& 05/22/2018)  . Osteoporosis   . Personal history of chemotherapy   . Shingles   . Skin cancer    basal cell x 2 (on back and on nose)  . Tinnitus of left ear     Past Surgical History:  Procedure  Laterality Date  . bone cyst removed Right 1978  . BREAST BIOPSY Left 08/28/2018  . COLON RESECTION Right 09/14/2018   Procedure: LAPAROSCOPIC  ASSISTED RIGHT COLECTOMY;  Surgeon: Claud Kelp, MD;  Location: [facility] OR;  Service: General;  Laterality: Right;  . COLONOSCOPY    . LAPAROSCOPIC RIGHT COLECTOMY  09/14/2018    Allergies  Allergen Reactions  . Kisqali (200 Mg Dose) [Ribociclib Succ (200 Mg Dose)] Itching and Rash    scratchy throat.    Outpatient Encounter Medications as of 12/29/2022  Medication Sig  . abemaciclib (VERZENIO) 100 MG tablet Take 1 tablet (100 mg total) by mouth daily.  . Carboxymeth-Glycerin-Polysorb (REFRESH OPTIVE ADVANCED) 0.5-1-0.5 % SOLN Apply 1 drop to eye daily as needed (Both eyes).  . Cholecalciferol (VITAMIN D3 PO) Take 2,000 Units by mouth daily.  Marland Kitchen denosumab (PROLIA) 60 MG/ML SOSY injection Inject 60 mg into the skin every 6 (six) months.  Marland Kitchen letrozole (FEMARA) 2.5 MG tablet Take 1 tablet (2.5 mg total) by mouth daily.  Marland Kitchen loperamide (IMODIUM) 2 MG capsule Take 2 mg by mouth as needed.  . Psyllium (METAMUCIL) 48.57 % POWD 1 packet with 8 ounces of liquid as needed  . [DISCONTINUED] ondansetron (ZOFRAN) 4 MG tablet Take 4 mg by mouth every 8 (eight) hours as  needed.   No facility-administered encounter medications on file as of 12/29/2022.    Review of Systems:  Review of Systems  Constitutional:  Positive for activity change. Negative for appetite change, chills, diaphoresis, fatigue, fever and unexpected weight change.  HENT:  Negative for congestion.   Respiratory:  Negative for cough, shortness of breath and wheezing.   Cardiovascular:  Negative for chest pain, palpitations and leg swelling.  Gastrointestinal:  Negative for abdominal distention, abdominal pain, constipation and diarrhea.  Genitourinary:  Negative for difficulty urinating and dysuria.  Musculoskeletal:  Positive for arthralgias (left knee and calf area) and joint swelling (left  knee area). Negative for back pain and gait problem.  Neurological:  Negative for dizziness, tremors, seizures, syncope, facial asymmetry, speech difficulty, weakness, light-headedness, numbness and headaches.  Psychiatric/Behavioral:  Negative for agitation, behavioral problems and confusion.     Health Maintenance  Topic Date Due  . Zoster Vaccines- Shingrix (2 of 2) 05/17/2012  . DTaP/Tdap/Td (2 - Td or Tdap) 06/20/2021  . COVID-19 Vaccine (7 - 2023-24 season) 08/13/2022  . INFLUENZA VACCINE  02/02/2023  . Medicare Annual Wellness (AWV)  04/06/2023  . Colonoscopy  11/18/2023  . MAMMOGRAM  08/22/2024  . Pneumonia Vaccine 39+ Years old  Completed  . DEXA SCAN  Completed  . Hepatitis C Screening  Completed  . HPV VACCINES  Aged Out    Physical Exam: Vitals:   12/29/22 1356  BP: 137/78  Pulse: 87  Resp: 18  Temp: (!) 97.3 F (36.3 C)  SpO2: 98%  Weight: 141 lb 6.4 oz (64.1 kg)  Height: 5\' 6"  (1.676 m)   Body mass index is 22.82 kg/m. Physical Exam Constitutional:      Appearance: Normal appearance.  Cardiovascular:     Rate and Rhythm: Normal rate and regular rhythm.  Pulmonary:     Effort: Pulmonary effort is normal.     Breath sounds: Normal breath sounds.  Musculoskeletal:        General: Swelling (left medial knee area.) present. No tenderness or signs of injury.     Right knee: Swelling present. No deformity, erythema or ecchymosis. No MCL laxity, ACL laxity or PCL laxity. Normal pulse.     Instability Tests: Anterior drawer test negative.     Left knee: Normal.     Right lower leg: No edema.     Left lower leg: No edema.     Comments: NO warmth or redness. Neg homans sign.  Does have varicose veins of bilateral lower ext.  Crepitus of left knee noted.   Skin:    General: Skin is warm and dry.  Neurological:     Mental Status: She is alert.    Labs reviewed: Basic Metabolic Panel: Recent Labs    03/17/22 1020 06/16/22 0957 10/10/22 0813  NA 137 137  139  K 4.5 4.3 3.9  CL 101 103 102  CO2 30 28 28   GLUCOSE 106* 79 86  BUN 18 17 17   CREATININE 1.17* 1.13* 1.13*  CALCIUM 10.0 9.3 9.3   Liver Function Tests: Recent Labs    03/17/22 1020 06/16/22 0957 10/10/22 0813  AST 17 21 22   ALT 11 13 15   ALKPHOS 53 57 60  BILITOT 0.4 0.3 0.3  PROT 7.1 6.9 7.1  ALBUMIN 4.0 4.2 4.1   No results for input(s): "LIPASE", "AMYLASE" in the last 8760 hours. No results for input(s): "AMMONIA" in the last 8760 hours. CBC: Recent Labs    03/17/22 1020 06/16/22 0957  10/10/22 0813  WBC 3.7* 4.1 4.5  NEUTROABS 1.9 2.3 2.5  HGB 12.3 12.3 13.0  HCT 36.6 36.1 39.3  MCV 98.4 99.2 97.0  PLT 85* 98* 106*   Lipid Panel: No results for input(s): "CHOL", "HDL", "LDLCALC", "TRIG", "CHOLHDL", "LDLDIRECT" in the last 8760 hours. Lab Results  Component Value Date   HGBA1C 5.4 09/03/2018    Procedures since last visit: No results found.  Assessment/Plan  1. Left leg pain  Pain to posterior knee and upper calf area.  Due her medical hx recommend doppler stat to rule out DVT.  No sob or chest pain is noted.  If negative f/u with orthopedist. On knee exam there is some medial knee swelling and crepitus.     Labs/tests ordered:  * No order type specified * venous doppler left Next appt:  12/30/2022

## 2022-12-29 NOTE — Telephone Encounter (Signed)
Administrative staff member informed me that Fletcher Anon, NP would like for patient to be called and triaged more prior to appointment. Christy please advise of all the questions you would like for patient to be asked.

## 2022-12-30 ENCOUNTER — Ambulatory Visit (INDEPENDENT_AMBULATORY_CARE_PROVIDER_SITE_OTHER): Payer: Medicare Other | Admitting: Nurse Practitioner

## 2022-12-30 ENCOUNTER — Encounter: Payer: Self-pay | Admitting: Nurse Practitioner

## 2022-12-30 VITALS — BP 112/68 | HR 76 | Temp 97.6°F | Resp 17 | Ht 66.0 in | Wt 142.2 lb

## 2022-12-30 DIAGNOSIS — K219 Gastro-esophageal reflux disease without esophagitis: Secondary | ICD-10-CM | POA: Diagnosis not present

## 2022-12-30 DIAGNOSIS — C50911 Malignant neoplasm of unspecified site of right female breast: Secondary | ICD-10-CM

## 2022-12-30 DIAGNOSIS — J302 Other seasonal allergic rhinitis: Secondary | ICD-10-CM

## 2022-12-30 DIAGNOSIS — I700 Atherosclerosis of aorta: Secondary | ICD-10-CM

## 2022-12-30 DIAGNOSIS — M79605 Pain in left leg: Secondary | ICD-10-CM | POA: Diagnosis not present

## 2022-12-30 DIAGNOSIS — C785 Secondary malignant neoplasm of large intestine and rectum: Secondary | ICD-10-CM

## 2022-12-30 DIAGNOSIS — M810 Age-related osteoporosis without current pathological fracture: Secondary | ICD-10-CM | POA: Diagnosis not present

## 2022-12-30 NOTE — Progress Notes (Signed)
Careteam: Patient Care Team: Sharon Seller, NP as PCP - General (Geriatric Medicine) Josph Macho, MD as Medical Oncologist (Oncology) Almond Lint, MD as Consulting Physician (General Surgery) Elmon Else, MD as Consulting Physician (Dermatology) Marcelle Overlie, MD as Consulting Physician (Obstetrics and Gynecology)  PLACE OF SERVICE:  [facility]  Advanced Directive information Does Patient Have a Medical Advance Directive?: Yes, Type of Advance Directive: Healthcare Power of [place];Living will, Does patient want to make changes to medical advance directive?: No - Patient declined  Allergies  Allergen Reactions   Kisqali (200 Mg Dose) [Ribociclib Succ (200 Mg Dose)] Itching and Rash    scratchy throat.    Chief Complaint  Patient presents with   Medical Management of Chronic Issues    6 month follow-up. Discuss need for shingrix and covid boosters. [facility] verified.      HPI: Patient is a 72 y.o. female for routine follow up.  She was seen yesterday due to pain behind left knee. Doppler was negative for DVT. She has also seen orthopedic who gave prednisone which improved symptoms but now then they worsened. Today pain is much better.  She was using a old compression sleeve but it was way too tight and that was contributing to her pain. She is not wearing at this time.   Reports she has a PET scan on the 8th of July then getting more blood work on the 18th.   OP- getting prolia through oncology.   Not having any seasonal allergies at this time, used to have a prolem but doing well now.    Review of Systems:  Review of Systems  Constitutional:  Negative for chills, fever and weight loss.  HENT:  Negative for tinnitus.   Respiratory:  Negative for cough, sputum production and shortness of breath.   Cardiovascular:  Negative for chest pain, palpitations and leg swelling.  Gastrointestinal:  Negative for abdominal pain, constipation, diarrhea and heartburn.   Genitourinary:  Negative for dysuria, frequency and urgency.  Musculoskeletal:  Negative for back pain, falls, joint pain and myalgias.  Skin: Negative.   Neurological:  Negative for dizziness and headaches.  Psychiatric/Behavioral:  Negative for depression and memory loss. The patient does not have insomnia.     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, right (HCC) 08/30/2018   Complication of anesthesia    very sensitive to meds   Goals of care, counseling/discussion 08/30/2018   History of bone density study 09/29/2020   History of colonoscopy 11/24/2020   History of EKG 10/10/2018   History of mammogram 01/17/2020   History of MRI    Breast (03/12/2020), Brain (06/13/2019) and Knee (02/23/2018& 05/22/2018)   Osteoporosis    Personal history of chemotherapy    Shingles    Skin cancer    basal cell x 2 (on back and on nose)   Tinnitus of left ear    Past Surgical History:  Procedure Laterality Date   bone cyst removed Right 1978   BREAST BIOPSY Left 08/28/2018   CATARACT EXTRACTION Bilateral    COLON RESECTION Right 09/14/2018   Procedure: LAPAROSCOPIC  ASSISTED RIGHT COLECTOMY;  Surgeon: Claud Kelp, MD;  Location: [facility] OR;  Service: General;  Laterality: Right;   COLONOSCOPY     LAPAROSCOPIC RIGHT COLECTOMY  09/14/2018   Social History:   reports that she has never smoked. She has never used smokeless tobacco. She reports current alcohol use. She reports that she  does not use drugs.  Family History  Problem Relation Age of Onset   Dementia Mother    Congestive Heart Failure Father    Early death Paternal Aunt    Dementia Maternal Grandmother    Breast cancer Neg Hx     Medications: Patient's Medications  New Prescriptions   No medications on file  Previous Medications   ABEMACICLIB (VERZENIO) 100 MG TABLET    Take 1 tablet (100 mg total) by mouth daily.   CARBOXYMETH-GLYCERIN-POLYSORB (REFRESH OPTIVE ADVANCED)  0.5-1-0.5 % SOLN    Apply 1 drop to eye daily as needed (Both eyes).   CHOLECALCIFEROL (VITAMIN D3 PO)    Take 2,000 Units by mouth daily.   DENOSUMAB (PROLIA) 60 MG/ML SOSY INJECTION    Inject 60 mg into the skin every 6 (six) months.   LETROZOLE (FEMARA) 2.5 MG TABLET    Take 1 tablet (2.5 mg total) by mouth daily.   LOPERAMIDE (IMODIUM) 2 MG CAPSULE    Take 2 mg by mouth as needed.   PSYLLIUM (METAMUCIL) 48.57 % POWD    1 packet with 8 ounces of liquid as needed  Modified Medications   No medications on file  Discontinued Medications   No medications on file    Physical Exam:  Vitals:   12/30/22 1055  BP: 112/68  Pulse: 76  Resp: 17  Temp: 97.6 F (36.4 C)  TempSrc: Temporal  SpO2: 98%  Weight: 142 lb 3.2 oz (64.5 kg)  Height: 5\' 6"  (1.676 m)   Body mass index is 22.95 kg/m. Wt Readings from Last 3 Encounters:  12/30/22 142 lb 3.2 oz (64.5 kg)  12/29/22 141 lb 6.4 oz (64.1 kg)  10/10/22 142 lb (64.4 kg)    Physical Exam Constitutional:      General: She is not in acute distress.    Appearance: She is well-developed. She is not diaphoretic.  HENT:     Head: Normocephalic and atraumatic.     Mouth/Throat:     Pharynx: No oropharyngeal exudate.  Eyes:     Conjunctiva/sclera: Conjunctivae normal.     Pupils: Pupils are equal, round, and reactive to light.  Cardiovascular:     Rate and Rhythm: Normal rate and regular rhythm.     Heart sounds: Normal heart sounds.  Pulmonary:     Effort: Pulmonary effort is normal.     Breath sounds: Normal breath sounds.  Abdominal:     General: Bowel sounds are normal.     Palpations: Abdomen is soft.  Musculoskeletal:     Cervical back: Normal range of motion and neck supple.     Right lower leg: No edema.     Left lower leg: No edema.  Skin:    General: Skin is warm and dry.  Neurological:     Mental Status: She is alert and oriented to person, place, and time.  Psychiatric:        Mood and Affect: Mood normal.      Labs reviewed: Basic Metabolic Panel: Recent Labs    03/17/22 1020 06/16/22 0957 10/10/22 0813  NA 137 137 139  K 4.5 4.3 3.9  CL 101 103 102  CO2 30 28 28   GLUCOSE 106* 79 86  BUN 18 17 17   CREATININE 1.17* 1.13* 1.13*  CALCIUM 10.0 9.3 9.3   Liver Function Tests: Recent Labs    03/17/22 1020 06/16/22 0957 10/10/22 0813  AST 17 21 22   ALT 11 13 15   ALKPHOS 53 57 60  BILITOT 0.4 0.3 0.3  PROT 7.1 6.9 7.1  ALBUMIN 4.0 4.2 4.1   No results for input(s): "LIPASE", "AMYLASE" in the last 8760 hours. No results for input(s): "AMMONIA" in the last 8760 hours. CBC: Recent Labs    03/17/22 1020 06/16/22 0957 10/10/22 0813  WBC 3.7* 4.1 4.5  NEUTROABS 1.9 2.3 2.5  HGB 12.3 12.3 13.0  HCT 36.6 36.1 39.3  MCV 98.4 99.2 97.0  PLT 85* 98* 106*   Lipid Panel: No results for input(s): "CHOL", "HDL", "LDLCALC", "TRIG", "CHOLHDL", "LDLDIRECT" in the last 8760 hours. TSH: No results for input(s): "TSH" in the last 8760 hours. A1C: Lab Results  Component Value Date   HGBA1C 5.4 09/03/2018     Assessment/Plan 1. Gastroesophageal reflux disease without esophagitis Well controlled with lifestyle modifications   2. 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 Has upcoming PET scan scheduled.   3. Osteoporosis without current pathological fracture, unspecified osteoporosis type -continues on prolia  4. Aortic atherosclerosis (HCC) -noted on imaging, will follow.  5. Seasonal allergies -improved at this time.   6. Left leg pain Has improved since she stopped wearing her compression wrap.   Return in about 6 months (around 07/01/2023) for routine follow up .  Janene Harvey. Biagio Borg [facility] [phone]

## 2022-12-31 ENCOUNTER — Emergency Department (HOSPITAL_COMMUNITY)
Admission: EM | Admit: 2022-12-31 | Discharge: 2022-12-31 | Disposition: A | Discharge status: Home / Self Care | Payer: Medicare Other | Attending: Emergency Medicine | Admitting: Emergency Medicine

## 2022-12-31 ENCOUNTER — Other Ambulatory Visit: Payer: Self-pay

## 2022-12-31 ENCOUNTER — Emergency Department (HOSPITAL_COMMUNITY): Payer: Medicare Other

## 2022-12-31 ENCOUNTER — Other Ambulatory Visit (HOSPITAL_COMMUNITY): Payer: Self-pay

## 2022-12-31 DIAGNOSIS — Z853 Personal history of malignant neoplasm of breast: Secondary | ICD-10-CM | POA: Diagnosis not present

## 2022-12-31 DIAGNOSIS — R202 Paresthesia of skin: Secondary | ICD-10-CM | POA: Diagnosis not present

## 2022-12-31 DIAGNOSIS — R110 Nausea: Secondary | ICD-10-CM | POA: Diagnosis not present

## 2022-12-31 DIAGNOSIS — R42 Dizziness and giddiness: Secondary | ICD-10-CM | POA: Diagnosis not present

## 2022-12-31 LAB — URINALYSIS, ROUTINE W REFLEX MICROSCOPIC
Bilirubin Urine: NEGATIVE
Glucose, UA: NEGATIVE mg/dL
Ketones, ur: NEGATIVE mg/dL
Leukocytes,Ua: NEGATIVE
Nitrite: NEGATIVE
Protein, ur: NEGATIVE mg/dL
Specific Gravity, Urine: 1.006 (ref 1.005–1.030)
pH: 6 (ref 5.0–8.0)

## 2022-12-31 LAB — CBC
HCT: 39.4 % (ref 36.0–46.0)
Hemoglobin: 13.1 g/dL (ref 12.0–15.0)
MCH: 32.6 pg (ref 26.0–34.0)
MCHC: 33.2 g/dL (ref 30.0–36.0)
MCV: 98 fL (ref 80.0–100.0)
Platelets: 45 10*3/uL — ABNORMAL LOW (ref 150–400)
RBC: 4.02 MIL/uL (ref 3.87–5.11)
RDW: 13.7 % (ref 11.5–15.5)
WBC: 5.7 10*3/uL (ref 4.0–10.5)
nRBC: 0 % (ref 0.0–0.2)

## 2022-12-31 LAB — BASIC METABOLIC PANEL
Anion gap: 8 (ref 5–15)
BUN: 15 mg/dL (ref 8–23)
CO2: 24 mmol/L (ref 22–32)
Calcium: 9.3 mg/dL (ref 8.9–10.3)
Chloride: 104 mmol/L (ref 98–111)
Creatinine, Ser: 1 mg/dL (ref 0.44–1.00)
GFR, Estimated: 60 mL/min — ABNORMAL LOW (ref >60)
Glucose, Bld: 98 mg/dL (ref 70–99)
Potassium: 4 mmol/L (ref 3.5–5.1)
Sodium: 136 mmol/L (ref 135–145)

## 2022-12-31 LAB — CBG MONITORING, ED: Glucose-Capillary: 99 mg/dL (ref 70–99)

## 2022-12-31 MED ORDER — MECLIZINE HCL 25 MG PO TABS
25.0000 mg | ORAL_TABLET | Freq: Three times a day (TID) | ORAL | 0 refills | Status: AC | PRN
  Filled 2022-12-31: qty 90, 30d supply, fill #0

## 2022-12-31 NOTE — Discharge Instructions (Addendum)
As discussed, your results show no acute intracranial process, electrolyte abnormality, or anemia. Prescription for Meclizine has been provided to take up to 3 times a day as needed for dizziness.   Follow up with your PCP in 48 hours for re-evaluation and further management of recurrent episodes of vertigo.  Information on how to perform Epley maneuver at home provided as well.  Return to ED if: You are always dizzy. You faint. You get very bad headaches. You get a stiff neck. Bright light starts to bother you. You have trouble moving or talking. You feel weak in your hands, arms, or legs. You have changes in your hearing or in how you see (vision).

## 2022-12-31 NOTE — ED Triage Notes (Signed)
Pt arrived via POV. C/o episode of dizziness and nausea at 0730 today, reported some tingling to their left arm. All symptoms resolved by the time triage started. No focal deficits observed. + strength and sensation.  AOx4

## 2022-12-31 NOTE — ED Provider Notes (Signed)
[place] EMERGENCY DEPARTMENT AT [facility] Provider Note   CSN: [id number] Arrival date & time: 12/31/22  1112     History  Chief Complaint  Patient presents with   Dizziness   Numbness   Nausea    Martha Osborne is a 72 y.o. female with a history of metastatic breast cancer presents to the ED today for dizziness.  Patient reports that she was sitting down this morning when she started to experience a sensation of the room spinning.  Pain was worse when she moved. Her symptoms resolved on their own prior to arrival to the ED. Husband was there at bedside and reports that patient did not have any slurred speech, weakness, or facial droop during patient's bout of dizziness.  She has experienced episodes of dizziness in the past but this one has been the worst. Additionally, she reports some tingling in her left fingers for the past 6 days. She maintains full range of motion and strength in his left hand.     Home Medications Prior to Admission medications   Medication Sig Start Date End Date Taking? Authorizing Provider  meclizine (ANTIVERT) 25 MG tablet Take 1 tablet (25 mg total) by mouth 3 (three) times daily as needed for dizziness. 12/31/22 01/30/23 Yes Maxwell Marion, PA-C  abemaciclib (VERZENIO) 100 MG tablet Take 1 tablet (100 mg total) by mouth daily. 10/06/22   Josph Macho, MD  Carboxymeth-Glycerin-Polysorb (REFRESH OPTIVE ADVANCED) 0.5-1-0.5 % SOLN Apply 1 drop to eye daily as needed (Both eyes).    [provider]  Cholecalciferol (VITAMIN D3 PO) Take 2,000 Units by mouth daily.    [provider]  denosumab (PROLIA) 60 MG/ML SOSY injection Inject 60 mg into the skin every 6 (six) months.    [provider]  letrozole (FEMARA) 2.5 MG tablet Take 1 tablet (2.5 mg total) by mouth daily. 11/29/22   Josph Macho, MD  loperamide (IMODIUM) 2 MG capsule Take 2 mg by mouth as needed.    [provider]  Psyllium (METAMUCIL) 48.57 %  POWD 1 packet with 8 ounces of liquid as needed    [provider]      Allergies    Kisqali (200 mg dose) [ribociclib succ (200 mg dose)]    Review of Systems   Review of Systems  Neurological:  Positive for dizziness.  All other systems reviewed and are negative.   Physical Exam Updated Vital Signs BP 126/70   Pulse 72   Temp 98.3 F (36.8 C) (Oral)   Resp 13   Ht 5\' 6"  (1.676 m)   Wt 64 kg   SpO2 100%   BMI 22.77 kg/m  Physical Exam Vitals and nursing note reviewed.  Constitutional:      Appearance: Normal appearance.  HENT:     Head: Normocephalic and atraumatic.     Mouth/Throat:     Mouth: Mucous membranes are moist.  Eyes:     Conjunctiva/sclera: Conjunctivae normal.     Pupils: Pupils are equal, round, and reactive to light.  Cardiovascular:     Rate and Rhythm: Normal rate and regular rhythm.     Pulses: Normal pulses.  Pulmonary:     Effort: Pulmonary effort is normal.     Breath sounds: Normal breath sounds.  Abdominal:     Palpations: Abdomen is soft.     Tenderness: There is no abdominal tenderness.  Musculoskeletal:        General: No tenderness. Normal range  of motion.     Right lower leg: No edema.     Left lower leg: No edema.  Skin:    General: Skin is warm and dry.     Findings: No rash.  Neurological:     General: No focal deficit present.     Mental Status: She is alert.     Sensory: No sensory deficit.     Motor: No weakness.  Psychiatric:        Mood and Affect: Mood normal.        Behavior: Behavior normal.     ED Results / Procedures / Treatments   Labs (all labs ordered are listed, but only abnormal results are displayed) Labs Reviewed  BASIC METABOLIC PANEL - Abnormal; Notable for the following components:      Result Value   GFR, Estimated 60 (*)    All other components within normal limits  CBC - Abnormal; Notable for the following components:   Platelets 45 (*)    All other components within normal limits   URINALYSIS, ROUTINE W REFLEX MICROSCOPIC - Abnormal; Notable for the following components:   Color, Urine STRAW (*)    Hgb urine dipstick SMALL (*)    Bacteria, UA RARE (*)    All other components within normal limits  CBG MONITORING, ED  CBG MONITORING, ED    EKG None  Radiology CT Head Wo Contrast  Result Date: 12/31/2022 CLINICAL DATA:  Vertigo, dizziness EXAM: CT HEAD WITHOUT CONTRAST TECHNIQUE: Contiguous axial images were obtained from the base of the skull through the vertex without intravenous contrast. RADIATION DOSE REDUCTION: This exam was performed according to the departmental dose-optimization program which includes automated exposure control, adjustment of the mA and/or kV according to patient size and/or use of iterative reconstruction technique. COMPARISON:  MR brain done on 06/13/2019 FINDINGS: Brain: No acute intracranial findings are seen. There are no signs of bleeding within the cranium. Ventricles are unremarkable. Cortical sulci are prominent. Vascular: Unremarkable. Skull: No acute findings are seen. Sinuses/Orbits: Unremarkable. Other: None. IMPRESSION: No acute intracranial findings are seen.  Atrophy. Electronically Signed   By: Ernie Avena M.D.   On: 12/31/2022 14:20   US Venous Img Lower Unilateral Left  Result Date: 12/29/2022 CLINICAL DATA:  Left lower pain 2 weeks EXAM: LEFT LOWER EXTREMITY VENOUS DOPPLER ULTRASOUND TECHNIQUE: Gray-scale sonography with compression, as well as color and duplex ultrasound, were performed to evaluate the deep venous system(s) from the level of the common femoral vein through the popliteal and proximal calf veins. COMPARISON:  None available FINDINGS: VENOUS Normal compressibility of the common femoral, superficial femoral, and popliteal veins, as well as the visualized calf veins. Visualized portions of profunda femoral vein and great saphenous vein unremarkable. No filling defects to suggest DVT on grayscale or color  Doppler imaging. Doppler waveforms show normal direction of venous flow, normal respiratory plasticity and response to augmentation. Limited views of the contralateral common femoral vein are unremarkable. OTHER None. Limitations: none IMPRESSION: No DVT of the left lower extremity. Electronically Signed   By: Acquanetta Belling M.D.   On: 12/29/2022 16:55    Procedures Procedures: not indicated.   Medications Ordered in ED Medications - No data to display  ED Course/ Medical Decision Making/ A&P                             Medical Decision Making Amount and/or Complexity of Data Reviewed Labs: ordered.  Radiology: ordered.   This patient presents to the ED for concern of dizziness, this involves an extensive number of treatment options, and is a complaint that carries with it a high risk of complications and morbidity.   Differential diagnosis includes: BPPV, labyrinthitis, Mnire's disease, TIA, stroke, atypical migraine, anemia, etc.   Co morbidities that complicate the patient evaluation  Metastatic breast cancer   Additional history obtained:  Additional history obtained from patient's chart   Cardiac Monitoring / EKG:  The patient was maintained on a cardiac monitor.  I personally viewed and interpreted the cardiac monitored which showed: normal sinus rhythm with a heart rate of 69 bpm.   Lab Tests:  I ordered and personally interpreted labs.  The pertinent results include:   GFR of 60, CMP otherwise unremarkable. CBC is unremarkable. U/A within normal limits. CBG of 99.   Imaging Studies ordered:  I ordered imaging studies including CT head without contrast  I independently visualized and interpreted imaging which showed: no acute abnormalities. I agree with the radiologist interpretation   Problem List / ED Course / Critical interventions / Medication management  Dizziness Patient was not dizzy in the ED, no medication ordered. I have reviewed the patients  home medicines and have made adjustments as needed   Social Determinants of Health:  Physical activity   Test / Admission - Considered:  Discussed results with patient and her husband. She is stable and safe for discharge home. Strict return precautions given        Final Clinical Impression(s) / ED Diagnoses Final diagnoses:  Vertigo    Rx / DC Orders ED Discharge Orders          Ordered    meclizine (ANTIVERT) 25 MG tablet  3 times daily PRN        12/31/22 1512              Maxwell Marion, PA-C 12/31/22 1518    Derwood Kaplan, MD 01/01/23 [phone]

## 2023-01-02 ENCOUNTER — Other Ambulatory Visit (HOSPITAL_COMMUNITY): Payer: Self-pay

## 2023-01-04 ENCOUNTER — Other Ambulatory Visit (HOSPITAL_COMMUNITY): Payer: Self-pay

## 2023-01-09 ENCOUNTER — Encounter (HOSPITAL_COMMUNITY)
Admission: RE | Admit: 2023-01-09 | Discharge: 2023-01-09 | Disposition: A | Discharge status: Home / Self Care | Payer: Medicare Other | Source: Ambulatory Visit | Attending: Hematology & Oncology | Admitting: Hematology & Oncology

## 2023-01-09 DIAGNOSIS — C50911 Malignant neoplasm of unspecified site of right female breast: Secondary | ICD-10-CM | POA: Diagnosis not present

## 2023-01-09 DIAGNOSIS — C50919 Malignant neoplasm of unspecified site of unspecified female breast: Secondary | ICD-10-CM | POA: Diagnosis not present

## 2023-01-09 DIAGNOSIS — C785 Secondary malignant neoplasm of large intestine and rectum: Secondary | ICD-10-CM | POA: Diagnosis not present

## 2023-01-09 DIAGNOSIS — I700 Atherosclerosis of aorta: Secondary | ICD-10-CM | POA: Diagnosis not present

## 2023-01-09 DIAGNOSIS — M1712 Unilateral primary osteoarthritis, left knee: Secondary | ICD-10-CM | POA: Diagnosis not present

## 2023-01-09 DIAGNOSIS — M47816 Spondylosis without myelopathy or radiculopathy, lumbar region: Secondary | ICD-10-CM | POA: Diagnosis not present

## 2023-01-09 LAB — GLUCOSE, CAPILLARY: Glucose-Capillary: 95 mg/dL (ref 70–99)

## 2023-01-09 MED ORDER — FLUDEOXYGLUCOSE F - 18 (FDG) INJECTION
7.0100 | Freq: Once | INTRAVENOUS | Status: AC | PRN
  Administered 2023-01-09: 7.01 via INTRAVENOUS

## 2023-01-19 ENCOUNTER — Encounter: Payer: Self-pay | Admitting: Hematology & Oncology

## 2023-01-19 ENCOUNTER — Inpatient Hospital Stay: Payer: Medicare Other | Attending: Hematology & Oncology

## 2023-01-19 ENCOUNTER — Inpatient Hospital Stay: Payer: Medicare Other | Admitting: Hematology & Oncology

## 2023-01-19 VITALS — BP 121/49 | HR 71 | Temp 98.8°F | Resp 20 | Ht 66.0 in | Wt 141.1 lb

## 2023-01-19 DIAGNOSIS — C50911 Malignant neoplasm of unspecified site of right female breast: Secondary | ICD-10-CM

## 2023-01-19 DIAGNOSIS — C785 Secondary malignant neoplasm of large intestine and rectum: Secondary | ICD-10-CM | POA: Diagnosis not present

## 2023-01-19 DIAGNOSIS — Z79811 Long term (current) use of aromatase inhibitors: Secondary | ICD-10-CM | POA: Insufficient documentation

## 2023-01-19 DIAGNOSIS — Z170 Estrogen receptor positive status [ER+]: Secondary | ICD-10-CM | POA: Insufficient documentation

## 2023-01-19 DIAGNOSIS — M810 Age-related osteoporosis without current pathological fracture: Secondary | ICD-10-CM | POA: Diagnosis not present

## 2023-01-19 DIAGNOSIS — C50912 Malignant neoplasm of unspecified site of left female breast: Secondary | ICD-10-CM | POA: Insufficient documentation

## 2023-01-19 LAB — CMP (CANCER CENTER ONLY)
ALT: 12 U/L (ref 0–44)
AST: 21 U/L (ref 15–41)
Albumin: 4.2 g/dL (ref 3.5–5.0)
Alkaline Phosphatase: 55 U/L (ref 38–126)
Anion gap: 8 (ref 5–15)
BUN: 17 mg/dL (ref 8–23)
CO2: 27 mmol/L (ref 22–32)
Calcium: 9.9 mg/dL (ref 8.9–10.3)
Chloride: 102 mmol/L (ref 98–111)
Creatinine: 1.07 mg/dL — ABNORMAL HIGH (ref 0.44–1.00)
GFR, Estimated: 55 mL/min — ABNORMAL LOW (ref >60)
Glucose, Bld: 88 mg/dL (ref 70–99)
Potassium: 4.9 mmol/L (ref 3.5–5.1)
Sodium: 137 mmol/L (ref 135–145)
Total Bilirubin: 0.3 mg/dL (ref 0.3–1.2)
Total Protein: 6.8 g/dL (ref 6.5–8.1)

## 2023-01-19 LAB — CBC WITH DIFFERENTIAL (CANCER CENTER ONLY)
Abs Immature Granulocytes: 0.03 10*3/uL (ref 0.00–0.07)
Basophils Absolute: 0 10*3/uL (ref 0.0–0.1)
Basophils Relative: 1 %
Eosinophils Absolute: 0.1 10*3/uL (ref 0.0–0.5)
Eosinophils Relative: 1 %
HCT: 38.6 % (ref 36.0–46.0)
Hemoglobin: 12.8 g/dL (ref 12.0–15.0)
Immature Granulocytes: 1 %
Lymphocytes Relative: 32 %
Lymphs Abs: 1.7 10*3/uL (ref 0.7–4.0)
MCH: 32.5 pg (ref 26.0–34.0)
MCHC: 33.2 g/dL (ref 30.0–36.0)
MCV: 98 fL (ref 80.0–100.0)
Monocytes Absolute: 0.5 10*3/uL (ref 0.1–1.0)
Monocytes Relative: 9 %
Neutro Abs: 3 10*3/uL (ref 1.7–7.7)
Neutrophils Relative %: 56 %
Platelet Count: 107 10*3/uL — ABNORMAL LOW (ref 150–400)
RBC: 3.94 MIL/uL (ref 3.87–5.11)
RDW: 13.2 % (ref 11.5–15.5)
WBC Count: 5.3 10*3/uL (ref 4.0–10.5)
nRBC: 0 % (ref 0.0–0.2)

## 2023-01-19 LAB — LACTATE DEHYDROGENASE: LDH: 122 U/L (ref 98–192)

## 2023-01-19 NOTE — Progress Notes (Signed)
Hematology and Oncology Follow Up Visit  Martha Osborne [id number] Nov 05, 1950 72 y.o. 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 daily Ribociclib 600 mg p.o. daily (21/7) -- d/c due to skin rash Prolia 60 mg IM q. 6 months -- next dose on 03/2023       Verzenio 100 mg po day - started on 06/12/2019 -changed 10/2021                    Interim History:  Ms. Stammen is back for follow-up.  She is doing okay.  Recently, she had to go to the ER because of a bad case of dizziness.  It was felt she may have had vertigo.  She had a CT scan of the brain which was unremarkable.  She also had a PET scan.  The PET scan was done on 01/09/2023.  The PET scan not show any evidence of active metastatic disease.  Her last CA 27.29 April was 19.5.  She had cataract surgery in May and June.  Everything went well with this.  They will be going to [place] into [place] over the next few months.  I know that she and her husband will have a wonderful time.  When she had lab work done in the ER for the dizziness, her platelet count was 45,000.  I think this was not accurate.  She has had no cough or shortness of breath.  There is been no change in bowel or bladder habits.  She has had no leg swelling.  She has had no nausea or vomiting.  Overall, I would say performance status is probably ECOG 1.    Medications:  Current Outpatient Medications:    abemaciclib (VERZENIO) 100 MG tablet, Take 1 tablet (100 mg total) by mouth daily., Disp: 30 tablet, Rfl: 6   Carboxymeth-Glycerin-Polysorb (REFRESH OPTIVE ADVANCED) 0.5-1-0.5 % SOLN, Apply 1 drop to eye daily as needed (Both eyes)., Disp: , Rfl:    Cholecalciferol (VITAMIN D3 PO), Take 2,000 Units by mouth daily., Disp: , Rfl:    denosumab (PROLIA) 60 MG/ML SOSY injection, Inject 60  mg into the skin every 6 (six) months., Disp: , Rfl:    letrozole (FEMARA) 2.5 MG tablet, Take 1 tablet (2.5 mg total) by mouth daily., Disp: 90 tablet, Rfl: 6   Psyllium (METAMUCIL) 48.57 % POWD, 1 packet with 8 ounces of liquid as needed, Disp: , Rfl:    loperamide (IMODIUM) 2 MG capsule, Take 2 mg by mouth as needed. (Patient not taking: Reported on 01/19/2023), Disp: , Rfl:    meclizine (ANTIVERT) 25 MG tablet, Take 1 tablet (25 mg total) by mouth 3 (three) times daily as needed for dizziness. (Patient not taking: Reported on 01/19/2023), Disp: 90 tablet, Rfl: 0  Allergies:  Allergies  Allergen Reactions   Kisqali (200 Mg Dose) [Ribociclib Succ (200 Mg Dose)] Itching and Rash    scratchy throat.    Past Medical History, Surgical history, Social history, and Family History were reviewed and updated.  Review of Systems: Review of Systems  Constitutional: Negative.   HENT:  Negative.   Eyes: Negative.   Respiratory: Negative.   Cardiovascular: Negative.   Gastrointestinal: Positive for abdominal pain.  Endocrine: Negative.   Genitourinary: Negative.    Musculoskeletal: Negative.   Skin: Negative.   Neurological: Negative.  Hematological: Negative.   Psychiatric/Behavioral: Negative.     Physical Exam:  height is 5\' 6"  (1.676 m) and weight is 141 lb 1.6 oz (64 kg). Her oral temperature is 98.8 F (37.1 C). Her blood pressure is 121/49 (abnormal) and her pulse is 71. Her respiration is 20 and oxygen saturation is 99%.   Wt Readings from Last 3 Encounters:  01/19/23 141 lb 1.6 oz (64 kg)  12/31/22 141 lb 1.5 oz (64 kg)  12/30/22 142 lb 3.2 oz (64.5 kg)    Physical Exam Vitals signs reviewed.  Constitutional:      Comments: Breast exam bilaterally shows no breast masses.  She has no nipple discharge bilaterally.  She has no breast swelling or erythema.  There is no bilateral axillary adenopathy.  HENT:     Head: Normocephalic and atraumatic.  Eyes:     Pupils: Pupils are  equal, round, and reactive to light.  Neck:     Musculoskeletal: Normal range of motion.  Cardiovascular:     Rate and Rhythm: Normal rate and regular rhythm.     Heart sounds: Normal heart sounds.  Pulmonary:     Effort: Pulmonary effort is normal.     Breath sounds: Normal breath sounds.  Abdominal:     General: Bowel sounds are normal.     Palpations: Abdomen is soft.     Comments: Abdominal exam shows the healing laparotomy scar.  She has no swelling.  There is no erythema about the surgical site.  She has been tenderness to palpation in the right lower quadrant.  No masses noted.  Bowel sounds are present.  There is no palpable liver or spleen tip.  Musculoskeletal: Normal range of motion.        General: No tenderness or deformity.  Lymphadenopathy:     Cervical: No cervical adenopathy.  Skin:    General: Skin is warm and dry.     Findings: No erythema or rash.  Neurological:     Mental Status: She is alert and oriented to person, place, and time.  Psychiatric:        Behavior: Behavior normal.        Thought Content: Thought content normal.        Judgment: Judgment normal.      Lab Results  Component Value Date   WBC 5.3 01/19/2023   HGB 12.8 01/19/2023   HCT 38.6 01/19/2023   MCV 98.0 01/19/2023   PLT 107 (L) 01/19/2023     Chemistry      Component Value Date/Time   NA 136 12/31/2022 1145   K 4.0 12/31/2022 1145   CL 104 12/31/2022 1145   CO2 24 12/31/2022 1145   BUN 15 12/31/2022 1145   CREATININE 1.00 12/31/2022 1145   CREATININE 1.13 (H) 10/10/2022 0813      Component Value Date/Time   CALCIUM 9.3 12/31/2022 1145   ALKPHOS 60 10/10/2022 0813   AST 22 10/10/2022 0813   ALT 15 10/10/2022 0813   BILITOT 0.3 10/10/2022 0813       Impression and Plan: Ms. Trowbridge is a 72 year old postmenopausal female.  She has metastatic lobular carcinoma of the left breast.  We finally found her primary after doing a breast MRI.  She had resection of the colonic  mass.  She had multiple positive lymph nodes.   I feel that she has episode of dizziness.  Hopefully this will not occur again.  I really think we do not have to do another  PET scan on her probably until next year.  We would like to get her back in 3 months.  I think this would be reasonable.   Christin Bach, MD

## 2023-01-20 LAB — CANCER ANTIGEN 27.29: CA 27.29: 26.6 U/mL (ref 0.0–38.6)

## 2023-01-25 ENCOUNTER — Encounter: Payer: Self-pay | Admitting: Hematology & Oncology

## 2023-01-25 ENCOUNTER — Other Ambulatory Visit (HOSPITAL_COMMUNITY): Payer: Self-pay

## 2023-01-30 ENCOUNTER — Other Ambulatory Visit: Payer: Self-pay

## 2023-02-17 DIAGNOSIS — Z85828 Personal history of other malignant neoplasm of skin: Secondary | ICD-10-CM | POA: Diagnosis not present

## 2023-02-17 DIAGNOSIS — L570 Actinic keratosis: Secondary | ICD-10-CM | POA: Diagnosis not present

## 2023-02-22 ENCOUNTER — Other Ambulatory Visit (HOSPITAL_COMMUNITY): Payer: Self-pay

## 2023-02-27 ENCOUNTER — Other Ambulatory Visit (HOSPITAL_COMMUNITY): Payer: Self-pay

## 2023-03-01 ENCOUNTER — Other Ambulatory Visit (HOSPITAL_COMMUNITY): Payer: Self-pay

## 2023-03-04 DIAGNOSIS — Z23 Encounter for immunization: Secondary | ICD-10-CM | POA: Diagnosis not present

## 2023-03-13 ENCOUNTER — Other Ambulatory Visit: Payer: Self-pay | Admitting: General Surgery

## 2023-03-13 DIAGNOSIS — C50312 Malignant neoplasm of lower-inner quadrant of left female breast: Secondary | ICD-10-CM

## 2023-03-20 ENCOUNTER — Other Ambulatory Visit (HOSPITAL_COMMUNITY): Payer: Self-pay

## 2023-03-30 ENCOUNTER — Other Ambulatory Visit (HOSPITAL_COMMUNITY): Payer: Self-pay

## 2023-04-06 DIAGNOSIS — D485 Neoplasm of uncertain behavior of skin: Secondary | ICD-10-CM | POA: Diagnosis not present

## 2023-04-06 DIAGNOSIS — C44622 Squamous cell carcinoma of skin of right upper limb, including shoulder: Secondary | ICD-10-CM | POA: Diagnosis not present

## 2023-04-10 ENCOUNTER — Encounter: Payer: Self-pay | Admitting: Nurse Practitioner

## 2023-04-10 ENCOUNTER — Ambulatory Visit (INDEPENDENT_AMBULATORY_CARE_PROVIDER_SITE_OTHER): Payer: Medicare Other | Admitting: Nurse Practitioner

## 2023-04-10 ENCOUNTER — Ambulatory Visit
Admission: RE | Admit: 2023-04-10 | Discharge: 2023-04-10 | Disposition: A | Discharge status: Home / Self Care | Payer: Medicare Other | Source: Ambulatory Visit | Attending: Nurse Practitioner | Admitting: Nurse Practitioner

## 2023-04-10 VITALS — BP 122/74 | HR 72 | Temp 97.1°F | Ht 66.0 in | Wt 141.0 lb

## 2023-04-10 DIAGNOSIS — M810 Age-related osteoporosis without current pathological fracture: Secondary | ICD-10-CM | POA: Diagnosis not present

## 2023-04-10 DIAGNOSIS — G8929 Other chronic pain: Secondary | ICD-10-CM

## 2023-04-10 DIAGNOSIS — M79645 Pain in left finger(s): Secondary | ICD-10-CM

## 2023-04-10 DIAGNOSIS — M25572 Pain in left ankle and joints of left foot: Secondary | ICD-10-CM

## 2023-04-10 DIAGNOSIS — M19042 Primary osteoarthritis, left hand: Secondary | ICD-10-CM | POA: Diagnosis not present

## 2023-04-10 DIAGNOSIS — M7732 Calcaneal spur, left foot: Secondary | ICD-10-CM | POA: Diagnosis not present

## 2023-04-10 MED ORDER — DICLOFENAC SODIUM 1 % EX GEL
2.0000 g | Freq: Four times a day (QID) | CUTANEOUS | 2 refills | Status: DC
Start: 2023-04-10 — End: 2023-12-22

## 2023-04-10 NOTE — Progress Notes (Unsigned)
Careteam: Patient Care Team: Sharon Seller, NP as PCP - General (Geriatric Medicine) Josph Macho, MD as Medical Oncologist (Oncology) Almond Lint, MD as Consulting Physician (General Surgery) Elmon Else, MD as Consulting Physician (Dermatology) Marcelle Overlie, MD as Consulting Physician (Obstetrics and Gynecology)  PLACE OF SERVICE:  [facility]  Advanced Directive information    Allergies  Allergen Reactions   Kisqali (200 Mg Dose) [Ribociclib Succ (200 Mg Dose)] Itching and Rash    scratchy throat.    Chief Complaint  Patient presents with   Acute Visit    Left side pain and concerns about " Left side of body falling apart." Patient opted to get flu vaccine later this month.      HPI: Patient is a 72 y.o. female presents for acute visit. Reports worsening pains. Has had issues for years with recurrent left knee pains but she is "accustomed to this", has seen ortho, xray done and negative for acute issues.  She is normally pretty active at baseline, hikes, plays pickleball. Recent hiking trip.  Feels like she has bilateral plantar fascitis, has heel pain, but has tried many different types of shoes which have somewhat improved the pain. Now calf muscles are so tight despite exercises. Had severe ankle pain that started in May, she was awaken in the middle of the night from the pain. No known injury. Pain continues to bother her. Has tried PRN tylenol but not helping much. This pain worsens when standing after periods of resting. Describes pain as achy, but also occasionally stabbing as well. Requires steadying herself when pain is bad. Denies swelling/redness. Has not tried using ankle brace.  Reports she just got back from a hiking trip  Also, pinky finger on left hand is hurting really bad, started in the beginning of September. No known injury to site, was just brushing her teeth when it started.   Feeling overwhelmed from all the pains and not sleeping  well.  Feels like her fatigue from cancer meds also make this worse (h/o stage 4 breast CA w/mets to colon s/p colectomy; about 4 years ago; sees oncology every 3-4 months, in remission); has follow-up with oncology on 10/18.   Review of Systems:  Review of Systems  Musculoskeletal:  Positive for joint pain (LEFT- knee, pinky finger, ankle). Negative for falls.  Psychiatric/Behavioral:  The patient has insomnia.     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, right (HCC) 08/30/2018   Complication of anesthesia    very sensitive to meds   Goals of care, counseling/discussion 08/30/2018   History of bone density study 09/29/2020   History of colonoscopy 11/24/2020   History of EKG 10/10/2018   History of mammogram 01/17/2020   History of MRI    Breast (03/12/2020), Brain (06/13/2019) and Knee (02/23/2018& 05/22/2018)   Osteoporosis    Personal history of chemotherapy    Shingles    Skin cancer    basal cell x 2 (on back and on nose)   Tinnitus of left ear    Past Surgical History:  Procedure Laterality Date   bone cyst removed Right 1978   BREAST BIOPSY Left 08/28/2018   CATARACT EXTRACTION Bilateral    COLON RESECTION Right 09/14/2018   Procedure: LAPAROSCOPIC  ASSISTED RIGHT COLECTOMY;  Surgeon: Claud Kelp, MD;  Location: [facility] OR;  Service: General;  Laterality: Right;   COLONOSCOPY     LAPAROSCOPIC RIGHT COLECTOMY  09/14/2018   Social  History:   reports that she has never smoked. She has never used smokeless tobacco. She reports current alcohol use. She reports that she does not use drugs.  Family History  Problem Relation Age of Onset   Dementia Mother    Congestive Heart Failure Father    Early death Paternal Aunt    Dementia Maternal Grandmother    Breast cancer Neg Hx     Medications: Patient's Medications  New Prescriptions   DICLOFENAC SODIUM (VOLTAREN) 1 % GEL    Apply 2 g topically 4 (four) times daily.   Previous Medications   ABEMACICLIB (VERZENIO) 100 MG TABLET    Take 1 tablet (100 mg total) by mouth daily.   CHOLECALCIFEROL (VITAMIN D3 PO)    Take 2,000 Units by mouth daily.   DENOSUMAB (PROLIA) 60 MG/ML SOSY INJECTION    Inject 60 mg into the skin every 6 (six) months.   GLYCERIN, PF, (BIOTRUE LUBRICANT) 0.5 % SOLN    Apply 2 drops to eye daily. Both eyes   LETROZOLE (FEMARA) 2.5 MG TABLET    Take 1 tablet (2.5 mg total) by mouth daily.   LOPERAMIDE (IMODIUM) 2 MG CAPSULE    Take 2 mg by mouth as needed.   PSYLLIUM (METAMUCIL) 48.57 % POWD    Take 1 packet by mouth daily.  Modified Medications   No medications on file  Discontinued Medications   CARBOXYMETH-GLYCERIN-POLYSORB (REFRESH OPTIVE ADVANCED) 0.5-1-0.5 % SOLN    Apply 1 drop to eye daily as needed (Both eyes).    Physical Exam:  Vitals:   04/10/23 1039  BP: 122/74  Pulse: 72  Temp: (!) 97.1 F (36.2 C)  TempSrc: Temporal  SpO2: 99%  Weight: 64 kg  Height: 5\' 6"  (1.676 m)   Body mass index is 22.76 kg/m. Wt Readings from Last 3 Encounters:  04/10/23 64 kg  01/19/23 64 kg  12/31/22 64 kg    Physical Exam Constitutional:      Appearance: Normal appearance.  Cardiovascular:     Rate and Rhythm: Normal rate and regular rhythm.     Pulses: Normal pulses.     Heart sounds: Normal heart sounds.  Pulmonary:     Effort: Pulmonary effort is normal.     Breath sounds: Normal breath sounds.  Abdominal:     General: Bowel sounds are normal.     Palpations: Abdomen is soft.  Musculoskeletal:        General: Tenderness (specially on internal rotation) present. No swelling (no swelling/redness on left ankle).     Comments: Mild left pinky finger swelling  Skin:    General: Skin is warm and dry.  Neurological:     General: No focal deficit present.     Mental Status: She is alert and oriented to person, place, and time.     Gait: Gait normal.  Psychiatric:        Behavior: Behavior normal.        Judgment:  Judgment normal.     Labs reviewed: Basic Metabolic Panel: Recent Labs    10/10/22 0813 12/31/22 1145 01/19/23 1145  NA 139 136 137  K 3.9 4.0 4.9  CL 102 104 102  CO2 28 24 27   GLUCOSE 86 98 88  BUN 17 15 17   CREATININE 1.13* 1.00 1.07*  CALCIUM 9.3 9.3 9.9   Liver Function Tests: Recent Labs    06/16/22 0957 10/10/22 0813 01/19/23 1145  AST 21 22 21   ALT 13 15 12   ALKPHOS  57 60 55  BILITOT 0.3 0.3 0.3  PROT 6.9 7.1 6.8  ALBUMIN 4.2 4.1 4.2   No results for input(s): "LIPASE", "AMYLASE" in the last 8760 hours. No results for input(s): "AMMONIA" in the last 8760 hours. CBC: Recent Labs    06/16/22 0957 10/10/22 0813 12/31/22 1145 01/19/23 1145  WBC 4.1 4.5 5.7 5.3  NEUTROABS 2.3 2.5  --  3.0  HGB 12.3 13.0 13.1 12.8  HCT 36.1 39.3 39.4 38.6  MCV 99.2 97.0 98.0 98.0  PLT 98* 106* 45* 107*   Lipid Panel: No results for input(s): "CHOL", "HDL", "LDLCALC", "TRIG", "CHOLHDL", "LDLDIRECT" in the last 8760 hours. TSH: No results for input(s): "TSH" in the last 8760 hours. A1C: Lab Results  Component Value Date   HGBA1C 5.4 09/03/2018     Assessment/Plan 1. Chronic pain of left ankle - DG Ankle Complete Left; Future -do not overuse ankle- suspect this has contributed to pain.  - C-reactive Protein - Uric Acid - DG Hand Complete Left; Future - diclofenac Sodium (VOLTAREN) 1 % GEL; Apply 2 g topically 4 (four) times daily.  Dispense: 150 g; Refill: 2 -Use ankle brace for support, ice to ankle TID; apply Voltaren gel AFTER ice  2. Finger pain, left - C-reactive Protein - Uric Acid - DG Hand Complete Left; Future - diclofenac Sodium (VOLTAREN) 1 % GEL; Apply 2 g topically 4 (four) times daily.  Dispense: 150 g; Refill: 2  3. Osteoporosis without current pathological fracture, unspecified osteoporosis type -Encouraged weight bearing exercises -Continue Prolia, Vit. D  Rollen Sox, FNP-MSN Student -I personally was present during the  history, physical exam and medical decision-making activities of this service and have verified that the service and findings are accurately documented in the student's note Abbey Chatters, NP

## 2023-04-10 NOTE — Patient Instructions (Signed)
Brace for left ankle for support To get xray at  imaging today- can take up to 14 days for the radiologist to read To ice ankle TID  To apple voltaren gel AFTER ice

## 2023-04-11 ENCOUNTER — Encounter: Payer: Self-pay | Admitting: Nurse Practitioner

## 2023-04-11 ENCOUNTER — Telehealth (INDEPENDENT_AMBULATORY_CARE_PROVIDER_SITE_OTHER): Payer: Medicare Other | Admitting: Nurse Practitioner

## 2023-04-11 DIAGNOSIS — Z0000 Encounter for general adult medical examination without abnormal findings: Secondary | ICD-10-CM

## 2023-04-11 LAB — C-REACTIVE PROTEIN: CRP: <3 mg/L (ref <8.0)

## 2023-04-11 LAB — URIC ACID: Uric Acid, Serum: 3.8 mg/dL (ref 2.5–7.0)

## 2023-04-11 NOTE — Progress Notes (Signed)
This service is provided via telemedicine  No vital signs collected/recorded due to the encounter was a telemedicine visit.   Location of patient (ex: home, work):  Home  Patient consents to a telephone visit: Yes  Location of the provider (ex: office, home):  [facility], Office   Name of any referring provider:  N/A  Names of all persons participating in the telemedicine service and their role in the encounter:  S.Chrae B/CMA, Abbey Chatters, NP, and Patient   Time spent on call:  6 min with medical assistant

## 2023-04-11 NOTE — Progress Notes (Signed)
Subjective:   Martha Osborne is a 72 y.o. female who presents for Medicare Annual (Subsequent) preventive examination.  Visit Complete: Virtual I connected with  Jones Bales on 04/11/23 by a video and audio enabled telemedicine application and verified that I am speaking with the correct person using two identifiers.  Patient Location: Home  Provider Location: Office/Clinic  I discussed the limitations of evaluation and management by telemedicine. The patient expressed understanding and agreed to proceed.  Vital Signs: Because this visit was a virtual/telehealth visit, some criteria may be missing or patient reported. Any vitals not documented were not able to be obtained and vitals that have been documented are patient reported.   Cardiac Risk Factors include: advanced age (>50men, >45 women)     Objective:    There were no vitals filed for this visit. There is no height or weight on file to calculate BMI.     04/11/2023    9:47 AM 01/19/2023   11:55 AM 12/31/2022   11:30 AM 12/30/2022   10:53 AM 12/29/2022    1:44 PM 10/10/2022    8:37 AM 07/01/2022    1:39 PM  Advanced Directives  Does Patient Have a Medical Advance Directive? Yes Yes  Yes Yes Yes Yes  Type of Sales promotion account executive of [employer] Power of [place];Living will Healthcare Power of [place];Living will Healthcare Power of [employer] of [place];Living will Healthcare Power of [place];Living will  Does patient want to make changes to medical advance directive? No - Patient declined   No - Patient declined No - Patient declined  No - Patient declined  Copy of Healthcare Power of Attorney in Chart? Yes - validated most recent copy scanned in chart (See row information) Yes - validated most recent copy scanned in chart (See row information)  Yes - validated most recent copy scanned in chart (See row information) Yes - validated most recent copy scanned in  chart (See row information) Yes - validated most recent copy scanned in chart (See row information) Yes - validated most recent copy scanned in chart (See row information)  Would patient like information on creating a medical advance directive?      No - Patient declined     Current Medications (verified) Outpatient Encounter Medications as of 04/11/2023  Medication Sig   abemaciclib (VERZENIO) 100 MG tablet Take 1 tablet (100 mg total) by mouth daily.   Cholecalciferol (VITAMIN D3 PO) Take 2,000 Units by mouth daily.   denosumab (PROLIA) 60 MG/ML SOSY injection Inject 60 mg into the skin every 6 (six) months.   diclofenac Sodium (VOLTAREN) 1 % GEL Apply 2 g topically 4 (four) times daily.   Glycerin, PF, (BIOTRUE LUBRICANT) 0.5 % SOLN Apply 2 drops to eye daily. Both eyes   letrozole (FEMARA) 2.5 MG tablet Take 1 tablet (2.5 mg total) by mouth daily.   loperamide (IMODIUM) 2 MG capsule Take 2 mg by mouth as needed.   Psyllium (METAMUCIL) 48.57 % POWD Take 1 packet by mouth daily.   No facility-administered encounter medications on file as of 04/11/2023.    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, right (HCC) 08/30/2018   Complication of anesthesia    very sensitive to meds   Goals of care, counseling/discussion 08/30/2018   History of bone density study 09/29/2020   History  of colonoscopy 11/24/2020   History of EKG 10/10/2018   History of mammogram 01/17/2020   History of MRI    Breast (03/12/2020), Brain (06/13/2019) and Knee (02/23/2018& 05/22/2018)   Osteoporosis    Personal history of chemotherapy    Shingles    Skin cancer    basal cell x 2 (on back and on nose)   Tinnitus of left ear    Past Surgical History:  Procedure Laterality Date   bone cyst removed Right 1978   BREAST BIOPSY Left 08/28/2018   CATARACT EXTRACTION Bilateral    COLON  RESECTION Right 09/14/2018   Procedure: LAPAROSCOPIC  ASSISTED RIGHT COLECTOMY;  Surgeon: Claud Kelp, MD;  Location: [facility] OR;  Service: General;  Laterality: Right;   COLONOSCOPY     LAPAROSCOPIC RIGHT COLECTOMY  09/14/2018   Family History  Problem Relation Age of Onset   Dementia Mother    Congestive Heart Failure Father    Early death Paternal Aunt    Dementia Maternal Grandmother    Breast cancer Neg Hx    Social History   Socioeconomic History   Marital status: Married    Spouse name: Not on file   Number of children: Not on file   Years of education: Not on file   Highest education level: Bachelor's degree (e.g., BA, AB, BS)  Occupational History   Not on file  Tobacco Use   Smoking status: Never   Smokeless tobacco: Never  Vaping Use   Vaping status: Never Used  Substance and Sexual Activity   Alcohol use: Yes    Comment: very rarely   Drug use: Never   Sexual activity: Not Currently  Other Topics Concern   Not on file  Social History Narrative   Diet: Omnivore      Caffeine: No      Married, if yes what year: Married/1975      Do you live in a house, apartment, assisted living, condo, trailer, ect: Rental townhouse      Is it one or more stories: 2      How many persons live in your home? 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 No   Do you have difficulty preparing food or eating? No   Do you have difficulty managing your medications? No   Do you have difficulty managing your finances? No   Do you have difficulty affording your medications? No   Social Determinants of Health    Financial Resource Strain: Low Risk  (12/27/2022)   Overall Financial Resource Strain (CARDIA)    Difficulty of Paying Living Expenses: Not hard at all  Food Insecurity: No Food Insecurity (12/27/2022)   Hunger Vital Sign    Worried About Running Out of Food in the Last Year: Never true    Ran Out of Food in the Last Year: Never true  Transportation Needs: No Transportation Needs (12/27/2022)   PRAPARE -  Administrator, Civil Service (Medical): No    Lack of Transportation (Non-Medical): No  Physical Activity: Insufficiently Active (12/27/2022)   Exercise Vital Sign    Days of Exercise per Week: 4 days    Minutes of Exercise per Session: 30 min  Stress: Stress Concern Present (12/27/2022)   [employer] of Occupational Health - Occupational Stress Questionnaire    Feeling of Stress : To some extent  Social Connections: Moderately Integrated (12/27/2022)   Social Connection and Isolation Panel [NHANES]    Frequency of Communication with Friends and Family: Twice a week    Frequency of Social Gatherings with Friends and Family: Once a week    Attends Religious Services: Never    Database administrator or Organizations: Yes    Attends Engineer, structural: More than 4 times per year    Marital Status: Married    Tobacco Counseling Counseling given: Not Answered   Clinical Intake:  Pre-visit preparation completed: Yes  Pain : No/denies pain     BMI - recorded: 22 Nutritional Status: BMI of 19-24  Normal Diabetes: No  How often do you need to have someone help you when you read instructions, pamphlets, or other written materials from your doctor or pharmacy?: 1 - Never         Activities of Daily Living    04/11/2023    9:57 AM  In your present state of health, do you have any difficulty performing the following activities:  Hearing? 1  Vision? 0  Difficulty concentrating or making decisions? 1  Walking or climbing stairs? 0  Dressing or  bathing? 0  Doing errands, shopping? 0  Preparing Food and eating ? N  Using the Toilet? N  In the past six months, have you accidently leaked urine? Y  Do you have problems with loss of bowel control? N  Managing your Medications? N  Managing your Finances? N  Housekeeping or managing your Housekeeping? N    Patient Care Team: Sharon Seller, NP as PCP - General (Geriatric Medicine) Josph Macho, MD as Medical Oncologist (Oncology) Almond Lint, MD as Consulting Physician (General Surgery) Elmon Else, MD as Consulting Physician (Dermatology) Marcelle Overlie, MD as Consulting Physician (Obstetrics and Gynecology) Melida Quitter, OD (Optometry)  Indicate any recent Medical Services you may have received from other than [facility] providers in the past year (date may be approximate).     Assessment:   This is a routine wellness examination for [facility].  Hearing/Vision screen Hearing Screening - Comments:: Patient with decreased hearing, patient would not like to be evaluated for hearing aids  Vision Screening - Comments:: Last eye exam less than 12 months ago, Dr.Petracca    Goals Addressed   None    Depression Screen    04/11/2023    9:43 AM 12/30/2022    1:03 PM 12/29/2022    1:56 PM 04/05/2022    8:47 AM 03/30/2021    8:18 AM 02/19/2021    1:25 PM  PHQ 2/9 Scores  PHQ - 2 Score 0 1 0 0 0 0    Fall Risk    04/11/2023    9:43 AM 04/10/2023   10:39 AM 12/30/2022    1:03 PM 12/29/2022    1:56 PM 04/05/2022    8:47 AM  Fall Risk   Falls in the past year? 0 0 0 0 0  Number falls in past yr: 0 0 0 0 0  Injury with Fall? 0  0 0 0 0  Risk for fall due to : No Fall Risks No Fall Risks No Fall Risks No Fall Risks No Fall Risks  Follow up Falls evaluation completed Falls evaluation completed Falls evaluation completed Falls evaluation completed     MEDICARE RISK AT HOME:    TIMED UP AND GO:  Was the test performed?  No    Cognitive Function:        04/11/2023     9:47 AM 04/05/2022    8:50 AM 03/30/2021    8:20 AM  6CIT Screen  What Year? 0 points 0 points 0 points  What month? 0 points 0 points 0 points  What time? 0 points 0 points 0 points  Count back from 20 0 points 0 points 0 points  Months in reverse 0 points 0 points 0 points  Repeat phrase 0 points 4 points 0 points  Total Score 0 points 4 points 0 points    Immunizations Immunization History  Administered Date(s) Administered   Influenza, High Dose Seasonal PF 05/01/2019, 05/07/2021, 04/30/2022   Influenza, Quadrivalent, Recombinant, Inj, Pf 04/19/2018   Influenza-Unspecified 06/04/2018, 05/01/2019, 04/23/2020   Moderna Covid-19 Fall Seasonal Vaccine 29yrs & older 06/18/2022   Moderna SARS-COV2 Booster Vaccination 01/23/2021   Moderna Sars-Covid-2 Vaccination 08/15/2019, 09/13/2019, 05/15/2020   PFIZER Comirnaty(Gray Top)Covid-19 Tri-Sucrose Vaccine 03/04/2023   Pfizer Covid-19 Vaccine Bivalent Booster 77yrs & up 04/16/2021, 11/05/2021   Pneumococcal Conjugate-13 04/28/2017   Pneumococcal Polysaccharide-23 06/20/2018   Tdap 06/21/2011, 07/08/2022   Zoster Recombinant(Shingrix) 03/22/2012    TDAP status: Up to date  Flu Vaccine status: Due, Education has been provided regarding the importance of this vaccine. Advised may receive this vaccine at local pharmacy or Health Dept. Aware to provide a copy of the vaccination record if obtained from local pharmacy or Health Dept. Verbalized acceptance and understanding.  Pneumococcal vaccine status: Up to date  Covid-19 vaccine status: Information provided on how to obtain vaccines.   Qualifies for Shingles Vaccine? Yes   Zostavax completed No   Shingrix Completed?: No.    Education has been provided regarding the importance of this vaccine. Patient has been advised to call insurance company to determine out of pocket expense if they have not yet received this vaccine. Advised may also receive vaccine at local pharmacy or Health Dept.  Verbalized acceptance and understanding.  Screening Tests Health Maintenance  Topic Date Due   Zoster Vaccines- Shingrix (2 of 2) 05/17/2012   INFLUENZA VACCINE  02/02/2023   COVID-19 Vaccine (8 - 2023-24 season) 04/29/2023   Colonoscopy  11/18/2023   Medicare Annual Wellness (AWV)  04/10/2024   MAMMOGRAM  08/22/2024   DEXA SCAN  10/16/2024   DTaP/Tdap/Td (3 - Td or Tdap) 07/08/2032   Pneumonia Vaccine 54+ Years old  Completed   Hepatitis C Screening  Completed   HPV VACCINES  Aged Out    Health Maintenance  Health Maintenance Due  Topic Date Due   Zoster Vaccines- Shingrix (2 of 2) 05/17/2012   INFLUENZA VACCINE  02/02/2023    Colorectal cancer screening: Type of screening: Colonoscopy. Completed 2022. Repeat every 3 years  Mammogram status: Ordered and scheduled for MRI. Pt provided with contact info and advised to call to schedule appt.   Bone Density status: Completed 10/2022. Results reflect: Bone density results: OSTEOPENIA. Repeat every 2 years.  Lung Cancer Screening: (Low Dose CT Chest recommended if Age 47-80 years, 20 pack-year currently smoking OR have quit w/in 15years.) does not qualify.  Lung Cancer Screening Referral: na  Additional Screening:  Hepatitis C Screening: does qualify; Completed  Vision Screening: Recommended annual ophthalmology exams for early detection of glaucoma and other disorders of the eye. Is the patient up to date with their annual eye exam?  Yes  Who is the provider or what is the name of the office in which the patient attends annual eye exams? Dr.Petracca  If pt is not established with a provider, would they like to be referred to a provider to establish care? No .   Dental Screening: Recommended annual dental exams for proper oral hygiene  Community Resource Referral / Chronic Care Management: CRR required this visit?  No   CCM required this visit?  No     Plan:     I have personally reviewed and noted the following in  the patient's chart:   Medical and social history Use of alcohol, tobacco or illicit drugs  Current medications and supplements including opioid prescriptions. Patient is not currently taking opioid prescriptions. Functional ability and status Nutritional status Physical activity Advanced directives List of other physicians Hospitalizations, surgeries, and ER visits in previous 12 months Vitals Screenings to include cognitive, depression, and falls Referrals and appointments  In addition, I have reviewed and discussed with patient certain preventive protocols, quality metrics, and best practice recommendations. A written personalized care plan for preventive services as well as general preventive health recommendations were provided to patient.     Sharon Seller, NP   04/11/2023   After Visit Summary: (MyChart) Due to this being a telephonic visit, the after visit summary with patients personalized plan was offered to patient via MyChart

## 2023-04-19 ENCOUNTER — Other Ambulatory Visit: Payer: Self-pay

## 2023-04-19 ENCOUNTER — Other Ambulatory Visit: Payer: Self-pay | Admitting: Hematology & Oncology

## 2023-04-19 MED ORDER — ABEMACICLIB 100 MG PO TABS
100.0000 mg | ORAL_TABLET | Freq: Every day | ORAL | 6 refills | Status: DC
  Filled 2023-04-19: qty 28, 28d supply, fill #0
  Filled 2023-05-10: qty 28, 28d supply, fill #1
  Filled 2023-06-13: qty 28, 28d supply, fill #2
  Filled 2023-07-04: qty 28, 28d supply, fill #3
  Filled 2023-08-10: qty 28, 28d supply, fill #4
  Filled 2023-09-05: qty 28, 28d supply, fill #5
  Filled 2023-10-04: qty 28, 28d supply, fill #6
  Filled ????-??-??: fill #7

## 2023-04-19 NOTE — Progress Notes (Signed)
Specialty Pharmacy Refill Coordination Note  Martha Osborne is a 72 y.o. female contacted today regarding refills of specialty medication(s) Abemaciclib   Patient requested Daryll Drown at [facility] at [place] date: 04/25/23   Medication will be filled on 04/24/23.  Refill request pending approval. Call if delayed.

## 2023-04-20 DIAGNOSIS — L570 Actinic keratosis: Secondary | ICD-10-CM | POA: Diagnosis not present

## 2023-04-20 DIAGNOSIS — C44622 Squamous cell carcinoma of skin of right upper limb, including shoulder: Secondary | ICD-10-CM | POA: Diagnosis not present

## 2023-04-21 ENCOUNTER — Inpatient Hospital Stay: Payer: Medicare Other | Attending: Hematology & Oncology

## 2023-04-21 ENCOUNTER — Encounter: Payer: Self-pay | Admitting: Hematology & Oncology

## 2023-04-21 ENCOUNTER — Inpatient Hospital Stay (HOSPITAL_BASED_OUTPATIENT_CLINIC_OR_DEPARTMENT_OTHER): Payer: Medicare Other | Admitting: Hematology & Oncology

## 2023-04-21 ENCOUNTER — Inpatient Hospital Stay: Payer: Medicare Other

## 2023-04-21 VITALS — BP 129/60 | HR 74 | Temp 98.8°F | Resp 20 | Ht 66.0 in | Wt 139.1 lb

## 2023-04-21 DIAGNOSIS — Z1721 Progesterone receptor positive status: Secondary | ICD-10-CM | POA: Insufficient documentation

## 2023-04-21 DIAGNOSIS — C50911 Malignant neoplasm of unspecified site of right female breast: Secondary | ICD-10-CM | POA: Diagnosis not present

## 2023-04-21 DIAGNOSIS — Z170 Estrogen receptor positive status [ER+]: Secondary | ICD-10-CM | POA: Diagnosis not present

## 2023-04-21 DIAGNOSIS — C50912 Malignant neoplasm of unspecified site of left female breast: Secondary | ICD-10-CM | POA: Insufficient documentation

## 2023-04-21 DIAGNOSIS — M810 Age-related osteoporosis without current pathological fracture: Secondary | ICD-10-CM | POA: Diagnosis not present

## 2023-04-21 DIAGNOSIS — Z1732 Human epidermal growth factor receptor 2 negative status: Secondary | ICD-10-CM | POA: Diagnosis not present

## 2023-04-21 DIAGNOSIS — Z9049 Acquired absence of other specified parts of digestive tract: Secondary | ICD-10-CM | POA: Diagnosis not present

## 2023-04-21 DIAGNOSIS — C785 Secondary malignant neoplasm of large intestine and rectum: Secondary | ICD-10-CM | POA: Diagnosis not present

## 2023-04-21 DIAGNOSIS — Z79811 Long term (current) use of aromatase inhibitors: Secondary | ICD-10-CM | POA: Diagnosis not present

## 2023-04-21 LAB — CBC WITH DIFFERENTIAL (CANCER CENTER ONLY)
Abs Immature Granulocytes: 0.02 10*3/uL (ref 0.00–0.07)
Basophils Absolute: 0 10*3/uL (ref 0.0–0.1)
Basophils Relative: 1 %
Eosinophils Absolute: 0.1 10*3/uL (ref 0.0–0.5)
Eosinophils Relative: 1 %
HCT: 39.1 % (ref 36.0–46.0)
Hemoglobin: 13.1 g/dL (ref 12.0–15.0)
Immature Granulocytes: 0 %
Lymphocytes Relative: 31 %
Lymphs Abs: 1.8 10*3/uL (ref 0.7–4.0)
MCH: 32.3 pg (ref 26.0–34.0)
MCHC: 33.5 g/dL (ref 30.0–36.0)
MCV: 96.5 fL (ref 80.0–100.0)
Monocytes Absolute: 0.4 10*3/uL (ref 0.1–1.0)
Monocytes Relative: 7 %
Neutro Abs: 3.5 10*3/uL (ref 1.7–7.7)
Neutrophils Relative %: 60 %
Platelet Count: 89 10*3/uL — ABNORMAL LOW (ref 150–400)
RBC: 4.05 MIL/uL (ref 3.87–5.11)
RDW: 12.3 % (ref 11.5–15.5)
WBC Count: 5.8 10*3/uL (ref 4.0–10.5)
nRBC: 0 % (ref 0.0–0.2)

## 2023-04-21 LAB — CMP (CANCER CENTER ONLY)
ALT: 12 U/L (ref 0–44)
AST: 22 U/L (ref 15–41)
Albumin: 4.1 g/dL (ref 3.5–5.0)
Alkaline Phosphatase: 58 U/L (ref 38–126)
Anion gap: 9 (ref 5–15)
BUN: 17 mg/dL (ref 8–23)
CO2: 27 mmol/L (ref 22–32)
Calcium: 9.7 mg/dL (ref 8.9–10.3)
Chloride: 100 mmol/L (ref 98–111)
Creatinine: 1.13 mg/dL — ABNORMAL HIGH (ref 0.44–1.00)
GFR, Estimated: 52 mL/min — ABNORMAL LOW (ref >60)
Glucose, Bld: 100 mg/dL — ABNORMAL HIGH (ref 70–99)
Potassium: 4.9 mmol/L (ref 3.5–5.1)
Sodium: 136 mmol/L (ref 135–145)
Total Bilirubin: 0.3 mg/dL (ref 0.3–1.2)
Total Protein: 6.8 g/dL (ref 6.5–8.1)

## 2023-04-21 MED ORDER — DENOSUMAB 60 MG/ML ~~LOC~~ SOSY
60.0000 mg | PREFILLED_SYRINGE | Freq: Once | SUBCUTANEOUS | Status: AC
  Administered 2023-04-21: 60 mg via SUBCUTANEOUS
  Filled 2023-04-21: qty 1

## 2023-04-21 NOTE — Progress Notes (Signed)
Hematology and Oncology Follow Up Visit  Martha Osborne [id number] July 19, 1950 72 y.o. 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 daily Ribociclib 600 mg p.o. daily (21/7) -- d/c due to skin rash Prolia 60 mg IM q. 6 months -- next dose on 09/2023         Verzenio 100 mg po day - started on 06/12/2019 -changed 10/2021                    Interim History:  Martha Osborne is back for follow-up.  As always, she has been incredibly busy.  She and her husband were out in [place] and [place].  They are there in September.  That a wonderful time out there..  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  She tolerated this well.  There was no bleeding despite some thrombocytopenia.  As far as her breast cancer is concerned, this really is not been a problem.  Her last CA 27.29 was 26.6.  She has had no change in bowel or bladder habits.  She played golf before she had her surgery for the squamous cell.  She is on Verzenio.  I think she is doing okay on the Verzenio.  She has had no issues with cough or shortness of breath.  She has had no fever.  There is been no headache.  Overall, I would have to say that her performance status is probably ECOG 1.     Medications:  Current Outpatient Medications:    abemaciclib (VERZENIO) 100 MG tablet, Take 1 tablet (100 mg total) by mouth daily., Disp: 30 tablet, Rfl: 6   Cholecalciferol (VITAMIN D3 PO), Take 2,000 Units by mouth daily., Disp: , Rfl:    denosumab (PROLIA) 60 MG/ML SOSY injection, Inject 60 mg into the skin every 6 (six) months., Disp: , Rfl:    diclofenac Sodium (VOLTAREN) 1 % GEL, Apply 2 g topically 4 (four) times daily., Disp: 150 g, Rfl: 2   Glycerin, PF, (BIOTRUE LUBRICANT) 0.5 % SOLN, Apply 2 drops  to eye daily. Both eyes, Disp: , Rfl:    letrozole (FEMARA) 2.5 MG tablet, Take 1 tablet (2.5 mg total) by mouth daily., Disp: 90 tablet, Rfl: 6   Psyllium (METAMUCIL) 48.57 % POWD, Take 1 packet by mouth daily., Disp: , Rfl:    loperamide (IMODIUM) 2 MG capsule, Take 2 mg by mouth as needed. (Patient not taking: Reported on 04/21/2023), Disp: , Rfl:  No current facility-administered medications for this visit.  Facility-Administered Medications Ordered in Other Visits:    denosumab (PROLIA) injection 60 mg, 60 mg, Subcutaneous, Once, Takenya Travaglini, Rose Phi, MD  Allergies:  Allergies  Allergen Reactions   Kisqali (200 Mg Dose) [Ribociclib Succ (200 Mg Dose)] Itching and Rash    scratchy throat.    Past Medical History, Surgical history, Social history, and Family History were reviewed and updated.  Review of Systems: Review of Systems  Constitutional: Negative.   HENT:  Negative.   Eyes: Negative.   Respiratory: Negative.   Cardiovascular: Negative.   Gastrointestinal: Positive for abdominal pain.  Endocrine: Negative.   Genitourinary: Negative.    Musculoskeletal: Negative.   Skin: Negative.   Neurological: Negative.   Hematological: Negative.   Psychiatric/Behavioral: Negative.     Physical Exam:  height is 5\' 6"  (1.676 m) and weight is 139 lb 1.9 oz (63.1 kg). Her oral temperature is 98.8 F (37.1 C). Her blood pressure is 129/60 and her pulse is 74. Her respiration is 20 and oxygen saturation is 99%.   Wt Readings from Last 3 Encounters:  04/21/23 139 lb 1.9 oz (63.1 kg)  04/10/23 141 lb (64 kg)  01/19/23 141 lb 1.6 oz (64 kg)    Physical Exam Vitals signs reviewed.  Constitutional:      Comments: Breast exam bilaterally shows no breast masses.  She has no nipple discharge bilaterally.  She has no breast swelling or erythema.  There is no bilateral axillary adenopathy.  HENT:     Head: Normocephalic and atraumatic.  Eyes:     Pupils: Pupils are equal, round, and  reactive to light.  Neck:     Musculoskeletal: Normal range of motion.  Cardiovascular:     Rate and Rhythm: Normal rate and regular rhythm.     Heart sounds: Normal heart sounds.  Pulmonary:     Effort: Pulmonary effort is normal.     Breath sounds: Normal breath sounds.  Abdominal:     General: Bowel sounds are normal.     Palpations: Abdomen is soft.     Comments: Abdominal exam shows the healing laparotomy scar.  She has no swelling.  There is no erythema about the surgical site.  She has been tenderness to palpation in the right lower quadrant.  No masses noted.  Bowel sounds are present.  There is no palpable liver or spleen tip.  Musculoskeletal: Normal range of motion.        General: No tenderness or deformity.  Lymphadenopathy:     Cervical: No cervical adenopathy.  Skin:    General: Skin is warm and dry.     Findings: No erythema or rash.  Neurological:     Mental Status: She is alert and oriented to person, place, and time.  Psychiatric:        Behavior: Behavior normal.        Thought Content: Thought content normal.        Judgment: Judgment normal.      Lab Results  Component Value Date   WBC 5.8 04/21/2023   HGB 13.1 04/21/2023   HCT 39.1 04/21/2023   MCV 96.5 04/21/2023   PLT 89 (L) 04/21/2023     Chemistry      Component Value Date/Time   NA 136 04/21/2023 1201   K 4.9 04/21/2023 1201   CL 100 04/21/2023 1201   CO2 27 04/21/2023 1201   BUN 17 04/21/2023 1201   CREATININE 1.13 (H) 04/21/2023 1201      Component Value Date/Time   CALCIUM 9.7 04/21/2023 1201   ALKPHOS 58 04/21/2023 1201   AST 22 04/21/2023 1201   ALT 12 04/21/2023 1201   BILITOT 0.3 04/21/2023 1201       Impression and Plan: [name] is a 72 year old postmenopausal female.  She has metastatic lobular carcinoma of the left breast.  We finally found her primary after doing a breast MRI.  She had resection of the colonic mass.  She had multiple positive lymph nodes.   Again,  I do not see any evidence that the breast cancer is progressing.  Everything looks pretty stable to me.  For right now, we will plan for another follow-up in 3 months.  We probably can have to do a PET scan when we see her back.  The last PET scan was back in July.  Hopefully, she will have a wonderful Holiday season.  I know that she and her husband are always quite active.  Christin Bach, MD

## 2023-04-22 LAB — CANCER ANTIGEN 27.29: CA 27.29: 33.3 U/mL (ref 0.0–38.6)

## 2023-04-24 ENCOUNTER — Other Ambulatory Visit (HOSPITAL_COMMUNITY): Payer: Self-pay

## 2023-04-24 ENCOUNTER — Telehealth: Payer: Self-pay

## 2023-04-24 NOTE — Telephone Encounter (Signed)
Advised via MyChart.

## 2023-04-24 NOTE — Telephone Encounter (Signed)
-----   Message from Martha Osborne sent at 04/22/2023 10:52 AM EDT ----- Please call and let her know that the tumor level is still normal.  However, it is slowly creeping up.  We will have to watch this closely.  [name]

## 2023-04-28 ENCOUNTER — Ambulatory Visit
Admission: RE | Admit: 2023-04-28 | Discharge: 2023-04-28 | Disposition: A | Discharge status: Home / Self Care | Payer: Medicare Other | Source: Ambulatory Visit | Attending: General Surgery | Admitting: General Surgery

## 2023-04-28 ENCOUNTER — Ambulatory Visit (INDEPENDENT_AMBULATORY_CARE_PROVIDER_SITE_OTHER): Payer: Medicare Other | Admitting: Adult Health

## 2023-04-28 VITALS — BP 121/78 | HR 71 | Temp 97.7°F | Resp 18 | Ht 66.0 in | Wt 143.0 lb

## 2023-04-28 DIAGNOSIS — T50Z95A Adverse effect of other vaccines and biological substances, initial encounter: Secondary | ICD-10-CM | POA: Diagnosis not present

## 2023-04-28 DIAGNOSIS — C785 Secondary malignant neoplasm of large intestine and rectum: Secondary | ICD-10-CM

## 2023-04-28 DIAGNOSIS — Z171 Estrogen receptor negative status [ER-]: Secondary | ICD-10-CM

## 2023-04-28 DIAGNOSIS — M79672 Pain in left foot: Secondary | ICD-10-CM

## 2023-04-28 DIAGNOSIS — M2570 Osteophyte, unspecified joint: Secondary | ICD-10-CM

## 2023-04-28 DIAGNOSIS — M8560 Other cyst of bone, unspecified site: Secondary | ICD-10-CM

## 2023-04-28 DIAGNOSIS — Z853 Personal history of malignant neoplasm of breast: Secondary | ICD-10-CM | POA: Diagnosis not present

## 2023-04-28 DIAGNOSIS — C50911 Malignant neoplasm of unspecified site of right female breast: Secondary | ICD-10-CM | POA: Diagnosis not present

## 2023-04-28 MED ORDER — PREDNISONE 20 MG PO TABS
20.0000 mg | ORAL_TABLET | Freq: Every day | ORAL | 0 refills | Status: AC
Start: 2023-04-28 — End: 2023-05-01

## 2023-04-28 MED ORDER — GADOPICLENOL 0.5 MMOL/ML IV SOLN
6.0000 mL | Freq: Once | INTRAVENOUS | Status: AC | PRN
  Administered 2023-04-28: 6 mL via INTRAVENOUS

## 2023-04-28 NOTE — Progress Notes (Signed)
[facility]  Provider:  Kenard Gower DNP  Code Status:  Full Code  Goals of Care:     04/28/2023   11:05 AM  Advanced Directives  Does Patient Have a Medical Advance Directive? Yes  Type of Advance Directive Healthcare Power of Attorney  Does patient want to make changes to medical advance directive? No - Patient declined  Copy of Healthcare Power of Attorney in Chart? Yes - validated most recent copy scanned in chart (See row information)     Chief Complaint  Patient presents with   Acute Visit    red swollen area on right arm from shingles shot, warm to the touch     HPI: Patient is a 72 y.o. female seen today for an acute visit for for leg swelling on the right upper arm s/p shingles shot 6 days ago.  She has noticed that the redness on her right upper arm has been getting bigger.  Area is warm to touch and tender.  She complains of pain on her left fifth finger.  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  She has left foot pain.  Imaging of left ankle mortise is symmetric and intact.  Tiny plantar calcaneal heel spur.  Mild dorsal tarsometatarsal degenerative osteophytosis.  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, right (HCC) 08/30/2018   Complication of anesthesia    very sensitive to meds   Goals of care, counseling/discussion 08/30/2018   History of bone density study 09/29/2020   History of colonoscopy 11/24/2020   History of EKG 10/10/2018   History of mammogram 01/17/2020   History of MRI    Breast (03/12/2020), Brain (06/13/2019) and Knee (02/23/2018& 05/22/2018)   Osteoporosis    Personal history of chemotherapy    Shingles    Skin cancer    basal cell x 2 (on back and on nose)   Tinnitus of left ear     Past Surgical History:  Procedure Laterality Date   bone cyst removed Right 1978    BREAST BIOPSY Left 08/28/2018   CATARACT EXTRACTION Bilateral    COLON RESECTION Right 09/14/2018   Procedure: LAPAROSCOPIC  ASSISTED RIGHT COLECTOMY;  Surgeon: Claud Kelp, MD;  Location: [facility] OR;  Service: General;  Laterality: Right;   COLONOSCOPY     LAPAROSCOPIC RIGHT COLECTOMY  09/14/2018    Allergies  Allergen Reactions   Kisqali (200 Mg Dose) [Ribociclib Succ (200 Mg Dose)] Itching and Rash    scratchy throat.    Outpatient Encounter Medications as of 04/28/2023  Medication Sig   abemaciclib (VERZENIO) 100 MG tablet Take 1 tablet (100 mg total) by mouth daily.   Cholecalciferol (VITAMIN D3 PO) Take 2,000 Units by mouth daily.   denosumab (PROLIA) 60 MG/ML SOSY injection Inject 60 mg into the skin every 6 (six) months.   diclofenac Sodium (VOLTAREN) 1 % GEL Apply 2 g topically 4 (four) times daily.   Glycerin, PF, (BIOTRUE LUBRICANT) 0.5 % SOLN Apply 2 drops to eye daily. Both eyes   letrozole (FEMARA) 2.5 MG tablet Take 1 tablet (2.5 mg total) by mouth daily.   loperamide (IMODIUM) 2 MG capsule Take 2 mg by mouth as needed.   Psyllium (METAMUCIL) 48.57 % POWD Take 1 packet by mouth daily.   No facility-administered encounter medications on file as of 04/28/2023.    Review of Systems:  Review  of Systems  Constitutional:  Negative for appetite change, chills, fatigue and fever.  HENT:  Negative for congestion, hearing loss, rhinorrhea and sore throat.   Eyes: Negative.   Respiratory:  Negative for cough, shortness of breath and wheezing.   Cardiovascular:  Negative for chest pain, palpitations and leg swelling.  Gastrointestinal:  Negative for abdominal pain, constipation, diarrhea, nausea and vomiting.  Genitourinary:  Negative for dysuria.  Musculoskeletal:  Positive for joint swelling. Negative for arthralgias, back pain and myalgias.  Skin:  Positive for rash. Negative for color change and wound.  Neurological:  Negative for dizziness, weakness and headaches.   Psychiatric/Behavioral:  Negative for behavioral problems. The patient is not nervous/anxious.     Health Maintenance  Topic Date Due   Zoster Vaccines- Shingrix (2 of 2) 05/17/2012   INFLUENZA VACCINE  02/02/2023   COVID-19 Vaccine (8 - 2023-24 season) 04/29/2023   Colonoscopy  11/18/2023   Medicare Annual Wellness (AWV)  04/10/2024   MAMMOGRAM  08/22/2024   DEXA SCAN  10/16/2024   DTaP/Tdap/Td (3 - Td or Tdap) 07/08/2032   Pneumonia Vaccine 81+ Years old  Completed   Hepatitis C Screening  Completed   HPV VACCINES  Aged Out    Physical Exam: Vitals:   04/28/23 1101  BP: 121/78  Pulse: 71  Resp: 18  Temp: 97.7 F (36.5 C)  SpO2: 97%  Weight: 143 lb (64.9 kg)  Height: 5\' 6"  (1.676 m)   Body mass index is 23.08 kg/m. Physical Exam Constitutional:      Appearance: Normal appearance.  HENT:     Head: Normocephalic and atraumatic.     Nose: Nose normal.     Mouth/Throat:     Mouth: Mucous membranes are moist.  Eyes:     Conjunctiva/sclera: Conjunctivae normal.  Cardiovascular:     Rate and Rhythm: Normal rate and regular rhythm.  Pulmonary:     Effort: Pulmonary effort is normal.     Breath sounds: Normal breath sounds.  Abdominal:     General: Bowel sounds are normal.     Palpations: Abdomen is soft.  Musculoskeletal:        General: Normal range of motion.     Cervical back: Normal range of motion.  Skin:    General: Skin is warm and dry.     Comments: Erythematous rashes on right upper arm, 7 X 9 cm  Neurological:     General: No focal deficit present.     Mental Status: She is alert and oriented to person, place, and time.  Psychiatric:        Mood and Affect: Mood normal.        Behavior: Behavior normal.        Thought Content: Thought content normal.        Judgment: Judgment normal.    Labs reviewed: Basic Metabolic Panel: Recent Labs    12/31/22 1145 01/19/23 1145 04/21/23 1201  NA 136 137 136  K 4.0 4.9 4.9  CL 104 102 100  CO2 24 27  27   GLUCOSE 98 88 100*  BUN 15 17 17   CREATININE 1.00 1.07* 1.13*  CALCIUM 9.3 9.9 9.7   Liver Function Tests: Recent Labs    10/10/22 0813 01/19/23 1145 04/21/23 1201  AST 22 21 22   ALT 15 12 12   ALKPHOS 60 55 58  BILITOT 0.3 0.3 0.3  PROT 7.1 6.8 6.8  ALBUMIN 4.1 4.2 4.1   No results for input(s): "LIPASE", "AMYLASE" in the  last 8760 hours. No results for input(s): "AMMONIA" in the last 8760 hours. CBC: Recent Labs    10/10/22 0813 12/31/22 1145 01/19/23 1145 04/21/23 1201  WBC 4.5 5.7 5.3 5.8  NEUTROABS 2.5  --  3.0 3.5  HGB 13.0 13.1 12.8 13.1  HCT 39.3 39.4 38.6 39.1  MCV 97.0 98.0 98.0 96.5  PLT 106* 45* 107* 89*   Lipid Panel: No results for input(s): "CHOL", "HDL", "LDLCALC", "TRIG", "CHOLHDL", "LDLDIRECT" in the last 8760 hours. Lab Results  Component Value Date   HGBA1C 5.4 09/03/2018    Procedures since last visit: DG Hand Complete Left  Result Date: 04/27/2023 CLINICAL DATA:  Pain and swelling of the fifth finger. No known injury. 5th PIP joint pain. EXAM: LEFT HAND - COMPLETE 3+ VIEW COMPARISON:  None Available. FINDINGS: Normal bone mineralization. Mild second through fifth DIP joint space narrowing. There is a tiny chronic ossicle at the lateral base of the third DIP joint. There is focal subchondral lucency at the distal third metacarpal head in a region measuring up to 2 mm in transverse dimension and less than 1 mm in depth, likely subchondral cystic change. Likely degenerative subchondral cyst at the lateral base of the trapezium at the articulation with the scaphoid. No acute fracture or dislocation. IMPRESSION: 1. Mild second through fifth DIP osteoarthritis. 2. Focal subchondral lucency at the distal third metacarpal head, likely subchondral cystic change. Electronically Signed   By: Neita Garnet M.D.   On: 04/27/2023 09:40   DG Ankle Complete Left  Result Date: 04/27/2023 CLINICAL DATA:  Medial ongoing left ankle pain.  No known injury. EXAM:  LEFT ANKLE COMPLETE - 3+ VIEW COMPARISON:  Left foot radiographs 05/05/2009 FINDINGS: The ankle mortise is symmetric and intact. Tiny plantar calcaneal heel spur. Mild dorsal tarsometatarsal degenerative osteophytosis on lateral view, likely within the second ray. No acute fracture or dislocation. IMPRESSION: 1. Tiny plantar calcaneal heel spur. 2. Mild 2nd dorsal tarsometatarsal degenerative osteophytosis. Electronically Signed   By: Neita Garnet M.D.   On: 04/27/2023 09:38    Assessment/Plan  1. Vaccine reaction, initial encounter -Can apply OTC hydrocortisone cream topically to right upper arm rashes BID PRN - predniSONE (DELTASONE) 20 MG tablet; Take 1 tablet (20 mg total) by mouth daily with breakfast for 3 days.  Dispense: 3 tablet; Refill: 0  2. 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 Subchondral cyst -Requested hand surgeon consultation - Ambulatory referral to Orthopedic Surgery  4. Left foot pain -Imaging showed tiny plantar calcaneal heel spur and mild second dorsal tarsometatarsal degenerative osteophytosis - Ambulatory referral to Podiatry    Labs/tests ordered:  None  Next appt:  06/19/2023

## 2023-05-10 ENCOUNTER — Other Ambulatory Visit: Payer: Self-pay

## 2023-05-10 NOTE — Progress Notes (Signed)
Specialty Pharmacy Refill Coordination Note  Martha Osborne is a 72 y.o. female contacted today regarding refills of specialty medication(s) Abemaciclib   Patient requested Daryll Drown at [facility] at [place] date: 05/24/23   Medication will be filled on 05/23/23.

## 2023-05-12 ENCOUNTER — Ambulatory Visit (INDEPENDENT_AMBULATORY_CARE_PROVIDER_SITE_OTHER): Payer: Medicare Other

## 2023-05-12 ENCOUNTER — Encounter: Payer: Self-pay | Admitting: Podiatry

## 2023-05-12 ENCOUNTER — Ambulatory Visit (INDEPENDENT_AMBULATORY_CARE_PROVIDER_SITE_OTHER): Payer: Medicare Other | Admitting: Podiatry

## 2023-05-12 DIAGNOSIS — M778 Other enthesopathies, not elsewhere classified: Secondary | ICD-10-CM

## 2023-05-12 DIAGNOSIS — M76822 Posterior tibial tendinitis, left leg: Secondary | ICD-10-CM

## 2023-05-12 NOTE — Progress Notes (Signed)
Subjective:  Patient ID: Martha Osborne, female    DOB: 25-Dec-1950,  MRN: [id number]  Chief Complaint  Patient presents with   Foot Pain    Foot and ankle pain X 5-6 month no injury    72 y.o. female presents with the above complaint.  Patient presents with complaint left medial foot pain.  Patient states painful to touch is progressive gotten worse worse with ambulation or shoe pressure is known for 5 to 6 months no injury.  Pain scale 7 out of 10 dull aching nature she would like to discuss treatment options for this.  She has not immobilized.   Review of Systems: Negative except as noted in the HPI. Denies N/V/F/Ch.  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, right (HCC) 08/30/2018   Complication of anesthesia    very sensitive to meds   Goals of care, counseling/discussion 08/30/2018   History of bone density study 09/29/2020   History of colonoscopy 11/24/2020   History of EKG 10/10/2018   History of mammogram 01/17/2020   History of MRI    Breast (03/12/2020), Brain (06/13/2019) and Knee (02/23/2018& 05/22/2018)   Osteoporosis    Personal history of chemotherapy    Shingles    Skin cancer    basal cell x 2 (on back and on nose)   Tinnitus of left ear     Current Outpatient Medications:    abemaciclib (VERZENIO) 100 MG tablet, Take 1 tablet (100 mg total) by mouth daily., Disp: 30 tablet, Rfl: 6   Cholecalciferol (VITAMIN D3 PO), Take 2,000 Units by mouth daily., Disp: , Rfl:    denosumab (PROLIA) 60 MG/ML SOSY injection, Inject 60 mg into the skin every 6 (six) months., Disp: , Rfl:    diclofenac Sodium (VOLTAREN) 1 % GEL, Apply 2 g topically 4 (four) times daily., Disp: 150 g, Rfl: 2   Glycerin, PF, (BIOTRUE LUBRICANT) 0.5 % SOLN, Apply 2 drops to eye daily. Both eyes, Disp: , Rfl:    letrozole (FEMARA) 2.5 MG tablet, Take 1 tablet (2.5 mg total) by mouth daily., Disp: 90 tablet, Rfl: 6   loperamide (IMODIUM) 2 MG capsule, Take 2  mg by mouth as needed., Disp: , Rfl:    Psyllium (METAMUCIL) 48.57 % POWD, Take 1 packet by mouth daily., Disp: , Rfl:   Social History   Tobacco Use  Smoking Status Never  Smokeless Tobacco Never    Allergies  Allergen Reactions   Kisqali (200 Mg Dose) [Ribociclib Succ (200 Mg Dose)] Itching and Rash    scratchy throat.   Objective:  There were no vitals filed for this visit. There is no height or weight on file to calculate BMI. Constitutional Well developed. Well nourished.  Vascular Dorsalis pedis pulses palpable bilaterally. Posterior tibial pulses palpable bilaterally. Capillary refill normal to all digits.  No cyanosis or clubbing noted. Pedal hair growth normal.  Neurologic Normal speech. Oriented to person, place, and time. Epicritic sensation to light touch grossly present bilaterally.  Dermatologic Nails well groomed and normal in appearance. No open wounds. No skin lesions.  Orthopedic: Pain on palpation to the left medial foot pain with resisted plantarflexion inversion of the foot no pain with dorsiflexion eversion of the foot.  No pain at the Achilles tendon peroneal tendon ATFL ligament.  Pes planovalgus foot structure noted.   Radiographs: None Assessment:   1. Posterior tibial tendinitis, left    Plan:  Patient was evaluated and treated and  all questions answered.  Left posterior tibial tendinitis -All questions and concerns were discussed with the patient extensive due to given the amount of pain that she has patient benefit from cam boot immobilization -Cam boot was dispensed -The patient will discuss steroid injection versus MRI  No follow-ups on file.

## 2023-05-13 DIAGNOSIS — Z23 Encounter for immunization: Secondary | ICD-10-CM | POA: Diagnosis not present

## 2023-05-16 ENCOUNTER — Telehealth: Payer: Self-pay | Admitting: Podiatry

## 2023-05-16 NOTE — Telephone Encounter (Signed)
Patient called stating she believes the boot you placed her in is causing her to have bruising around her foot and ankle. She stated that she is in pain almost always and has been applying ice inside the boot to help with bruising is there anyway you could advise her on what she is to do regarding this matter,  Thanks!

## 2023-05-23 ENCOUNTER — Other Ambulatory Visit (HOSPITAL_COMMUNITY): Payer: Self-pay

## 2023-05-23 ENCOUNTER — Other Ambulatory Visit: Payer: Self-pay

## 2023-05-24 ENCOUNTER — Other Ambulatory Visit (HOSPITAL_COMMUNITY): Payer: Self-pay

## 2023-06-06 ENCOUNTER — Encounter: Payer: Self-pay | Admitting: Adult Health

## 2023-06-06 DIAGNOSIS — D2372 Other benign neoplasm of skin of left lower limb, including hip: Secondary | ICD-10-CM | POA: Diagnosis not present

## 2023-06-06 DIAGNOSIS — L578 Other skin changes due to chronic exposure to nonionizing radiation: Secondary | ICD-10-CM | POA: Diagnosis not present

## 2023-06-06 DIAGNOSIS — T8131XA Disruption of external operation (surgical) wound, not elsewhere classified, initial encounter: Secondary | ICD-10-CM | POA: Diagnosis not present

## 2023-06-06 DIAGNOSIS — D225 Melanocytic nevi of trunk: Secondary | ICD-10-CM | POA: Diagnosis not present

## 2023-06-06 DIAGNOSIS — D485 Neoplasm of uncertain behavior of skin: Secondary | ICD-10-CM | POA: Diagnosis not present

## 2023-06-06 DIAGNOSIS — L570 Actinic keratosis: Secondary | ICD-10-CM | POA: Diagnosis not present

## 2023-06-06 DIAGNOSIS — D2271 Melanocytic nevi of right lower limb, including hip: Secondary | ICD-10-CM | POA: Diagnosis not present

## 2023-06-06 DIAGNOSIS — L821 Other seborrheic keratosis: Secondary | ICD-10-CM | POA: Diagnosis not present

## 2023-06-06 DIAGNOSIS — D0471 Carcinoma in situ of skin of right lower limb, including hip: Secondary | ICD-10-CM | POA: Diagnosis not present

## 2023-06-06 DIAGNOSIS — Z85828 Personal history of other malignant neoplasm of skin: Secondary | ICD-10-CM | POA: Diagnosis not present

## 2023-06-13 ENCOUNTER — Other Ambulatory Visit: Payer: Self-pay

## 2023-06-13 NOTE — Progress Notes (Signed)
Specialty Pharmacy Refill Coordination Note  Martha Osborne is a 72 y.o. female contacted today regarding refills of specialty medication(s) Abemaciclib   Patient requested Daryll Drown at [facility] at [place] date: 06/19/23   Medication will be filled on 06/16/23.

## 2023-06-14 ENCOUNTER — Encounter: Payer: Self-pay | Admitting: Hematology & Oncology

## 2023-06-14 ENCOUNTER — Ambulatory Visit (INDEPENDENT_AMBULATORY_CARE_PROVIDER_SITE_OTHER): Payer: Medicare Other | Admitting: Podiatry

## 2023-06-14 ENCOUNTER — Encounter: Payer: Self-pay | Admitting: Adult Health

## 2023-06-14 DIAGNOSIS — M76822 Posterior tibial tendinitis, left leg: Secondary | ICD-10-CM

## 2023-06-14 NOTE — Progress Notes (Signed)
Subjective:  Patient ID: Martha Osborne, female    DOB: 07/19/1950,  MRN: [id number]  Chief Complaint  Patient presents with   Foot Pain    Left foot ,she feels about the same.      72 y.o. female presents with the above complaint.  Patient presents for follow-up of left posterior tibial tendinitis.  She states she is doing a lot better.  Pain has improved.  She denies any other acute issues.  Review of Systems: Negative except as noted in the HPI. Denies N/V/F/Ch.  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, right (HCC) 08/30/2018   Complication of anesthesia    very sensitive to meds   Goals of care, counseling/discussion 08/30/2018   History of bone density study 09/29/2020   History of colonoscopy 11/24/2020   History of EKG 10/10/2018   History of mammogram 01/17/2020   History of MRI    Breast (03/12/2020), Brain (06/13/2019) and Knee (02/23/2018& 05/22/2018)   Osteoporosis    Personal history of chemotherapy    Shingles    Skin cancer    basal cell x 2 (on back and on nose)   Tinnitus of left ear     Current Outpatient Medications:    abemaciclib (VERZENIO) 100 MG tablet, Take 1 tablet (100 mg total) by mouth daily., Disp: 30 tablet, Rfl: 6   Cholecalciferol (VITAMIN D3 PO), Take 2,000 Units by mouth daily., Disp: , Rfl:    denosumab (PROLIA) 60 MG/ML SOSY injection, Inject 60 mg into the skin every 6 (six) months., Disp: , Rfl:    diclofenac Sodium (VOLTAREN) 1 % GEL, Apply 2 g topically 4 (four) times daily., Disp: 150 g, Rfl: 2   Glycerin, PF, (BIOTRUE LUBRICANT) 0.5 % SOLN, Apply 2 drops to eye daily. Both eyes, Disp: , Rfl:    letrozole (FEMARA) 2.5 MG tablet, Take 1 tablet (2.5 mg total) by mouth daily., Disp: 90 tablet, Rfl: 6   loperamide (IMODIUM) 2 MG capsule, Take 2 mg by mouth as needed., Disp: , Rfl:    Psyllium (METAMUCIL) 48.57 % POWD, Take 1 packet by mouth daily., Disp: , Rfl:   Social History   Tobacco Use   Smoking Status Never  Smokeless Tobacco Never    Allergies  Allergen Reactions   Kisqali (200 Mg Dose) [Ribociclib Succ (200 Mg Dose)] Itching and Rash    scratchy throat.   Objective:  There were no vitals filed for this visit. There is no height or weight on file to calculate BMI. Constitutional Well developed. Well nourished.  Vascular Dorsalis pedis pulses palpable bilaterally. Posterior tibial pulses palpable bilaterally. Capillary refill normal to all digits.  No cyanosis or clubbing noted. Pedal hair growth normal.  Neurologic Normal speech. Oriented to person, place, and time. Epicritic sensation to light touch grossly present bilaterally.  Dermatologic Nails well groomed and normal in appearance. No open wounds. No skin lesions.  Orthopedic: Pain on palpation to the left medial foot pain with resisted plantarflexion inversion of the foot no pain with dorsiflexion eversion of the foot.  No pain at the Achilles tendon peroneal tendon ATFL ligament.  Pes planovalgus foot structure noted.   Radiographs: None Assessment:   No diagnosis found.  Plan:  Patient was evaluated and treated and all questions answered.  Left posterior tibial tendinitis -All questions and concerns were discussed with the patient extensive due to clinically her pain is improved.  She was able to wear a Tri-Lock  ankle brace with shoe changes with power step insoles.  If any foot and ankle issues or in the future she will come back and see me. -I discussed shoe gear modification and conservative care.  No follow-ups on file.

## 2023-06-16 DIAGNOSIS — C50911 Malignant neoplasm of unspecified site of right female breast: Secondary | ICD-10-CM | POA: Diagnosis not present

## 2023-06-16 DIAGNOSIS — C50312 Malignant neoplasm of lower-inner quadrant of left female breast: Secondary | ICD-10-CM | POA: Diagnosis not present

## 2023-06-16 DIAGNOSIS — Z170 Estrogen receptor positive status [ER+]: Secondary | ICD-10-CM | POA: Diagnosis not present

## 2023-06-16 DIAGNOSIS — C785 Secondary malignant neoplasm of large intestine and rectum: Secondary | ICD-10-CM | POA: Diagnosis not present

## 2023-06-19 ENCOUNTER — Ambulatory Visit: Payer: Medicare Other | Admitting: Nurse Practitioner

## 2023-06-19 ENCOUNTER — Encounter: Payer: Self-pay | Admitting: Adult Health

## 2023-06-19 ENCOUNTER — Other Ambulatory Visit (HOSPITAL_COMMUNITY): Payer: Self-pay

## 2023-06-19 ENCOUNTER — Ambulatory Visit (INDEPENDENT_AMBULATORY_CARE_PROVIDER_SITE_OTHER): Payer: Medicare Other | Admitting: Adult Health

## 2023-06-19 VITALS — BP 121/78 | HR 82 | Temp 97.6°F | Resp 18 | Ht 66.0 in | Wt 142.6 lb

## 2023-06-19 DIAGNOSIS — Z7189 Other specified counseling: Secondary | ICD-10-CM | POA: Diagnosis not present

## 2023-06-19 DIAGNOSIS — M810 Age-related osteoporosis without current pathological fracture: Secondary | ICD-10-CM | POA: Diagnosis not present

## 2023-06-19 DIAGNOSIS — C50919 Malignant neoplasm of unspecified site of unspecified female breast: Secondary | ICD-10-CM

## 2023-06-19 NOTE — Progress Notes (Signed)
[facility] clinic  Provider:  Kenard Gower DNP  Code Status:  Full Code  Goals of Care:     04/28/2023   11:05 AM  Advanced Directives  Does Patient Have a Medical Advance Directive? Yes  Type of Advance Directive Healthcare Power of Attorney  Does patient want to make changes to medical advance directive? No - Patient declined  Copy of Healthcare Power of Attorney in Chart? Yes - validated most recent copy scanned in chart (See row information)     Chief Complaint  Patient presents with   Medical Management of Chronic Issues    Six month follow-up   Immunizations    Shingrix    HPI: Patient is a 72 y.o. female seen today for follow-up of chronic medical issues.  Most Form completed today.  Primary malignant neoplasm of breast with metastasis (HCC) -   follows up at cancer center, takes Letrozole  Osteoporosis without current pathological fracture, unspecified osteoporosis type - no recent fractures, takes Prolia  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, right (HCC) 08/30/2018   Complication of anesthesia    very sensitive to meds   Goals of care, counseling/discussion 08/30/2018   History of bone density study 09/29/2020   History of colonoscopy 11/24/2020   History of EKG 10/10/2018   History of mammogram 01/17/2020   History of MRI    Breast (03/12/2020), Brain (06/13/2019) and Knee (02/23/2018& 05/22/2018)   Osteoporosis    Personal history of chemotherapy    Shingles    Skin cancer    basal cell x 2 (on back and on nose)   Tinnitus of left ear     Past Surgical History:  Procedure Laterality Date   bone cyst removed Right 1978   BREAST BIOPSY Left 08/28/2018   CATARACT EXTRACTION Bilateral    COLON RESECTION Right 09/14/2018   Procedure: LAPAROSCOPIC  ASSISTED RIGHT COLECTOMY;  Surgeon: Claud Kelp, MD;  Location: [facility] OR;  Service: General;  Laterality: Right;   COLONOSCOPY     LAPAROSCOPIC RIGHT  COLECTOMY  09/14/2018    Allergies  Allergen Reactions   Kisqali (200 Mg Dose) [Ribociclib Succ (200 Mg Dose)] Itching and Rash    scratchy throat.    Outpatient Encounter Medications as of 06/19/2023  Medication Sig   abemaciclib (VERZENIO) 100 MG tablet Take 1 tablet (100 mg total) by mouth daily.   Cholecalciferol (VITAMIN D3 PO) Take 2,000 Units by mouth daily.   denosumab (PROLIA) 60 MG/ML SOSY injection Inject 60 mg into the skin every 6 (six) months.   Glycerin, PF, (BIOTRUE LUBRICANT) 0.5 % SOLN Apply 2 drops to eye daily. Both eyes   letrozole (FEMARA) 2.5 MG tablet Take 1 tablet (2.5 mg total) by mouth daily.   loperamide (IMODIUM) 2 MG capsule Take 2 mg by mouth as needed.   Psyllium (METAMUCIL) 48.57 % POWD Take 1 packet by mouth daily.   diclofenac Sodium (VOLTAREN) 1 % GEL Apply 2 g topically 4 (four) times daily. (Patient not taking: Reported on 06/19/2023)   No facility-administered encounter medications on file as of 06/19/2023.    Review of Systems:  Review of Systems  Constitutional:  Negative for appetite change, chills, fatigue and fever.  HENT:  Negative for congestion, hearing loss, rhinorrhea and sore throat.   Eyes: Negative.   Respiratory:  Negative for cough, shortness of breath and wheezing.   Cardiovascular:  Negative for chest pain, palpitations and leg swelling.  Gastrointestinal:  Negative for abdominal pain, constipation, diarrhea, nausea and vomiting.  Genitourinary:  Negative for dysuria.  Musculoskeletal:  Negative for arthralgias, back pain and myalgias.  Skin:  Negative for color change, rash and wound.  Neurological:  Negative for dizziness, weakness and headaches.  Psychiatric/Behavioral:  Negative for behavioral problems. The patient is not nervous/anxious.     Health Maintenance  Topic Date Due   Zoster Vaccines- Shingrix (2 of 2) 05/17/2012   COVID-19 Vaccine (8 - 2024-25 season) 04/29/2023   Colonoscopy  11/18/2023   Medicare  Annual Wellness (AWV)  04/10/2024   DEXA SCAN  10/16/2024   MAMMOGRAM  04/27/2025   DTaP/Tdap/Td (3 - Td or Tdap) 07/08/2032   Pneumonia Vaccine 72+ Years old  Completed   INFLUENZA VACCINE  Completed   Hepatitis C Screening  Completed   HPV VACCINES  Aged Out    Physical Exam: Vitals:   06/19/23 1328  BP: 121/78  Pulse: 82  Resp: 18  Temp: 97.6 F (36.4 C)  SpO2: 97%  Weight: 142 lb 9.6 oz (64.7 kg)  Height: 5\' 6"  (1.676 m)   Body mass index is 23.02 kg/m. Physical Exam Constitutional:      Appearance: Normal appearance.  HENT:     Head: Normocephalic and atraumatic.     Nose: Nose normal.     Mouth/Throat:     Mouth: Mucous membranes are moist.  Eyes:     Conjunctiva/sclera: Conjunctivae normal.  Cardiovascular:     Rate and Rhythm: Normal rate and regular rhythm.  Pulmonary:     Effort: Pulmonary effort is normal.     Breath sounds: Normal breath sounds.  Abdominal:     General: Bowel sounds are normal.     Palpations: Abdomen is soft.  Musculoskeletal:        General: Normal range of motion.     Cervical back: Normal range of motion.  Skin:    General: Skin is warm and dry.  Neurological:     General: No focal deficit present.     Mental Status: She is alert and oriented to person, place, and time.  Psychiatric:        Mood and Affect: Mood normal.        Behavior: Behavior normal.        Thought Content: Thought content normal.        Judgment: Judgment normal.     Labs reviewed: Basic Metabolic Panel: Recent Labs    12/31/22 1145 01/19/23 1145 04/21/23 1201  NA 136 137 136  K 4.0 4.9 4.9  CL 104 102 100  CO2 24 27 27   GLUCOSE 98 88 100*  BUN 15 17 17   CREATININE 1.00 1.07* 1.13*  CALCIUM 9.3 9.9 9.7   Liver Function Tests: Recent Labs    10/10/22 0813 01/19/23 1145 04/21/23 1201  AST 22 21 22   ALT 15 12 12   ALKPHOS 60 55 58  BILITOT 0.3 0.3 0.3  PROT 7.1 6.8 6.8  ALBUMIN 4.1 4.2 4.1   No results for input(s): "LIPASE",  "AMYLASE" in the last 8760 hours. No results for input(s): "AMMONIA" in the last 8760 hours. CBC: Recent Labs    10/10/22 0813 12/31/22 1145 01/19/23 1145 04/21/23 1201  WBC 4.5 5.7 5.3 5.8  NEUTROABS 2.5  --  3.0 3.5  HGB 13.0 13.1 12.8 13.1  HCT 39.3 39.4 38.6 39.1  MCV 97.0 98.0 98.0 96.5  PLT 106* 45* 107* 89*   Lipid Panel: No results for input(s): "CHOL", "  HDL", "LDLCALC", "TRIG", "CHOLHDL", "LDLDIRECT" in the last 8760 hours. Lab Results  Component Value Date   HGBA1C 5.4 09/03/2018    Procedures since last visit: DG Ankle 2 Views Left Result Date: 05/23/2023 Please see detailed radiograph report in office note.  DG Ankle Complete Left Result Date: 05/23/2023 Please see detailed radiograph report in office note.   Assessment/Plan  1. Primary malignant neoplasm of breast with metastasis (HCC) (Primary) -  Continue letrozole and abemaciclib -   Follow-up with   cancer center  2. Osteoporosis without current pathological fracture, unspecified osteoporosis type -   Continue Prolia -   Fall precautions  3. Counseling regarding advance care planning and goals of care -    MOST form completed and printed copy for patient   Labs/tests ordered:  None  Next appt:  Visit date not found

## 2023-06-22 DIAGNOSIS — H43813 Vitreous degeneration, bilateral: Secondary | ICD-10-CM | POA: Diagnosis not present

## 2023-06-22 DIAGNOSIS — H16223 Keratoconjunctivitis sicca, not specified as Sjogren's, bilateral: Secondary | ICD-10-CM | POA: Diagnosis not present

## 2023-06-22 DIAGNOSIS — Z961 Presence of intraocular lens: Secondary | ICD-10-CM | POA: Diagnosis not present

## 2023-07-03 DIAGNOSIS — M79645 Pain in left finger(s): Secondary | ICD-10-CM | POA: Diagnosis not present

## 2023-07-04 ENCOUNTER — Other Ambulatory Visit (HOSPITAL_COMMUNITY): Payer: Self-pay

## 2023-07-04 ENCOUNTER — Other Ambulatory Visit: Payer: Self-pay

## 2023-07-04 NOTE — Progress Notes (Signed)
 Specialty Pharmacy Refill Coordination Note  Martha Osborne is a 72 y.o. female contacted today regarding refills of specialty medication(s) Abemaciclib  (VERZENIO )   Patient requested [name] at [facility] [place] date: 07/14/23   Medication will be filled on 01.09.25.

## 2023-07-04 NOTE — Progress Notes (Signed)
 Specialty Pharmacy Ongoing Clinical Assessment Note  Martha Osborne is a 72 y.o. female who is being followed by the specialty pharmacy service for RxSp Oncology   Patient's specialty medication(s) reviewed today: Abemaciclib  (VERZENIO )   Missed doses in the last 4 weeks: 0   Patient/Caregiver did not have any additional questions or concerns.   Therapeutic benefit summary: Unable to assess   Adverse events/side effects summary: No adverse events/side effects   Patient's therapy is appropriate to: Continue    Goals Addressed             This Visit's Progress    Stabilization of disease       Patient is on track. Patient will maintain adherence         Follow up:  6 months  Mitzie GORMAN [facility] Specialty Pharmacist

## 2023-07-12 ENCOUNTER — Other Ambulatory Visit: Payer: Self-pay | Admitting: General Surgery

## 2023-07-12 DIAGNOSIS — Z853 Personal history of malignant neoplasm of breast: Secondary | ICD-10-CM

## 2023-07-13 ENCOUNTER — Ambulatory Visit (INDEPENDENT_AMBULATORY_CARE_PROVIDER_SITE_OTHER): Payer: Medicare Other | Admitting: Adult Health

## 2023-07-13 ENCOUNTER — Other Ambulatory Visit: Payer: Self-pay

## 2023-07-13 ENCOUNTER — Encounter: Payer: Self-pay | Admitting: Adult Health

## 2023-07-13 VITALS — BP 132/78 | HR 97 | Temp 99.4°F | Resp 20 | Ht 66.0 in | Wt 142.0 lb

## 2023-07-13 DIAGNOSIS — B9689 Other specified bacterial agents as the cause of diseases classified elsewhere: Secondary | ICD-10-CM

## 2023-07-13 DIAGNOSIS — J208 Acute bronchitis due to other specified organisms: Secondary | ICD-10-CM

## 2023-07-13 DIAGNOSIS — C50919 Malignant neoplasm of unspecified site of unspecified female breast: Secondary | ICD-10-CM

## 2023-07-13 MED ORDER — GUAIFENESIN ER 600 MG PO TB12: 600.0000 mg | ORAL_TABLET | Freq: Two times a day (BID) | ORAL | 0 refills | Status: AC

## 2023-07-13 MED ORDER — AZITHROMYCIN 250 MG PO TABS: ORAL_TABLET | ORAL | 0 refills | Status: AC

## 2023-07-13 NOTE — Progress Notes (Signed)
 [facility] clinic  Provider:  Jereld Serum DNP  Code Status:  Full Code  Goals of Care:     07/13/2023   10:38 AM  Advanced Directives  Does Patient Have a Medical Advance Directive? Yes  Type of Estate Agent of [place];Out of facility DNR (pink MOST or yellow form)  Does patient want to make changes to medical advance directive? No - Patient declined  Pre-existing out of facility DNR order (yellow form or pink MOST form) Pink MOST form placed in chart (order not valid for inpatient use)     Chief Complaint  Patient presents with   Acute Visit    cough congestion, chills,fever weakness feeling very poorly. Patient did a Covid test and it was negative    HPI: Patient is a 73 y.o. female seen today for an acute visit for cough, congestion, fever and weakness.   Started sinus drainage which irritated my throat Saturday  (4 days ago), has cough and body aches Gotten worse yesterday and today Painful to swallow Fever 99.8, chills Cough with phlegm, golden brown, wheezing last night Tested negative for COVID-19 at home yesterday Had wheezing last night Had multiple COVID-19 vaccine - husband feels sick yesterday  Tested negative for COVID-19 today.  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, right (HCC) 08/30/2018   Complication of anesthesia    very sensitive to meds   Goals of care, counseling/discussion 08/30/2018   History of bone density study 09/29/2020   History of colonoscopy 11/24/2020   History of EKG 10/10/2018   History of mammogram 01/17/2020   History of MRI    Breast (03/12/2020), Brain (06/13/2019) and Knee (02/23/2018& 05/22/2018)   Osteoporosis    Personal history of chemotherapy    Shingles    Skin cancer    basal cell x 2 (on back and on nose)   Tinnitus of left ear     Past Surgical History:  Procedure Laterality Date   bone cyst removed Right 1978   BREAST BIOPSY Left  08/28/2018   CATARACT EXTRACTION Bilateral    COLON RESECTION Right 09/14/2018   Procedure: LAPAROSCOPIC  ASSISTED RIGHT COLECTOMY;  Surgeon: Gail Favorite, MD;  Location: [facility] OR;  Service: General;  Laterality: Right;   COLONOSCOPY     LAPAROSCOPIC RIGHT COLECTOMY  09/14/2018    Allergies  Allergen Reactions   Kisqali  (200 Mg Dose) [Ribociclib  Succ (200 Mg Dose)] Itching and Rash    scratchy throat.    Outpatient Encounter Medications as of 07/13/2023  Medication Sig   abemaciclib  (VERZENIO ) 100 MG tablet Take 1 tablet (100 mg total) by mouth daily.   Cholecalciferol (VITAMIN D3 PO) Take 2,000 Units by mouth daily.   denosumab  (PROLIA ) 60 MG/ML SOSY injection Inject 60 mg into the skin every 6 (six) months.   Glycerin, PF, (BIOTRUE LUBRICANT) 0.5 % SOLN Apply 2 drops to eye daily. Both eyes   letrozole  (FEMARA ) 2.5 MG tablet Take 1 tablet (2.5 mg total) by mouth daily.   loperamide (IMODIUM) 2 MG capsule Take 2 mg by mouth as needed.   Psyllium (METAMUCIL) 48.57 % POWD Take 1 packet by mouth daily.   diclofenac  Sodium (VOLTAREN ) 1 % GEL Apply 2 g topically 4 (four) times daily. (Patient not taking: Reported on 07/13/2023)   No facility-administered encounter medications on file as of 07/13/2023.    Review of Systems:  Review of Systems  Constitutional:  Positive for appetite change, chills and  fever. Negative for fatigue.  HENT:  Negative for congestion, hearing loss, rhinorrhea and sore throat.   Eyes: Negative.   Respiratory:  Positive for cough and wheezing. Negative for shortness of breath.   Cardiovascular:  Negative for chest pain, palpitations and leg swelling.  Gastrointestinal:  Negative for abdominal pain, constipation, diarrhea, nausea and vomiting.  Genitourinary:  Negative for dysuria.  Musculoskeletal:  Negative for arthralgias, back pain and myalgias.  Skin:  Negative for color change, rash and wound.  Neurological:  Negative for dizziness, weakness and headaches.   Psychiatric/Behavioral:  Negative for behavioral problems. The patient is not nervous/anxious.     Health Maintenance  Topic Date Due   Zoster Vaccines- Shingrix (2 of 2) 05/17/2012   COVID-19 Vaccine (8 - 2024-25 season) 04/29/2023   Colonoscopy  11/25/2023   Medicare Annual Wellness (AWV)  04/10/2024   DEXA SCAN  10/16/2024   MAMMOGRAM  04/27/2025   DTaP/Tdap/Td (3 - Td or Tdap) 07/08/2032   Pneumonia Vaccine 8+ Years old  Completed   INFLUENZA VACCINE  Completed   Hepatitis C Screening  Completed   HPV VACCINES  Aged Out    Physical Exam: Vitals:   07/13/23 1040  BP: 132/78  Pulse: 97  Resp: 20  Temp: 99.4 F (37.4 C)  SpO2: 96%  Weight: 142 lb (64.4 kg)  Height: 5' 6 (1.676 m)   Body mass index is 22.92 kg/m. Physical Exam Constitutional:      Appearance: Normal appearance.  HENT:     Head: Normocephalic and atraumatic.     Nose: Nose normal.     Mouth/Throat:     Mouth: Mucous membranes are moist.  Eyes:     Conjunctiva/sclera: Conjunctivae normal.  Cardiovascular:     Rate and Rhythm: Normal rate and regular rhythm.  Pulmonary:     Effort: Pulmonary effort is normal.     Breath sounds: Rhonchi present.  Abdominal:     General: Bowel sounds are normal.     Palpations: Abdomen is soft.  Musculoskeletal:        General: Normal range of motion.     Cervical back: Normal range of motion.  Skin:    General: Skin is warm and dry.  Neurological:     General: No focal deficit present.     Mental Status: She is alert and oriented to person, place, and time.  Psychiatric:        Mood and Affect: Mood normal.        Behavior: Behavior normal.        Thought Content: Thought content normal.        Judgment: Judgment normal.     Labs reviewed: Basic Metabolic Panel: Recent Labs    12/31/22 1145 01/19/23 1145 04/21/23 1201  NA 136 137 136  K 4.0 4.9 4.9  CL 104 102 100  CO2 24 27 27   GLUCOSE 98 88 100*  BUN 15 17 17   CREATININE 1.00 1.07*  1.13*  CALCIUM 9.3 9.9 9.7   Liver Function Tests: Recent Labs    10/10/22 0813 01/19/23 1145 04/21/23 1201  AST 22 21 22   ALT 15 12 12   ALKPHOS 60 55 58  BILITOT 0.3 0.3 0.3  PROT 7.1 6.8 6.8  ALBUMIN 4.1 4.2 4.1   No results for input(s): LIPASE, AMYLASE in the last 8760 hours. No results for input(s): AMMONIA in the last 8760 hours. CBC: Recent Labs    10/10/22 0813 12/31/22 1145 01/19/23 1145 04/21/23 1201  WBC 4.5 5.7 5.3 5.8  NEUTROABS 2.5  --  3.0 3.5  HGB 13.0 13.1 12.8 13.1  HCT 39.3 39.4 38.6 39.1  MCV 97.0 98.0 98.0 96.5  PLT 106* 45* 107* 89*   Lipid Panel: No results for input(s): CHOL, HDL, LDLCALC, TRIG, CHOLHDL, LDLDIRECT in the last 8760 hours. Lab Results  Component Value Date   HGBA1C 5.4 09/03/2018    Procedures since last visit: No results found.  Assessment/Plan  1. Acute bacterial bronchitis (Primary) -  will start on Azithromycin  and Mucinex  -  gargle with warm water and salt TID - guaiFENesin  (MUCINEX ) 600 MG 12 hr tablet; Take 1 tablet (600 mg total) by mouth 2 (two) times daily for 14 days.  Dispense: 28 tablet; Refill: 0 - azithromycin  (ZITHROMAX ) 250 MG tablet; Take 2 tablets on day 1, then 1 tablet daily on days 2 through 5  Dispense: 6 tablet; Refill: 0 - POC COVID-19 -  negative - guaiFENesin  (MUCINEX ) 600 MG 12 hr tablet; Take 1 tablet (600 mg total) by mouth 2 (two) times daily for 14 days.  Dispense: 28 tablet; Refill: 0  2. Primary malignant neoplasm of breast with metastasis (HCC) -  takes Verzenio  -  follows up with oncology    Labs/tests ordered:  COVID-19 test  Next appt:  12/22/2023

## 2023-07-14 ENCOUNTER — Other Ambulatory Visit: Payer: Self-pay

## 2023-07-17 ENCOUNTER — Other Ambulatory Visit (HOSPITAL_COMMUNITY): Payer: Self-pay

## 2023-07-18 LAB — POC COVID19 BINAXNOW: SARS Coronavirus 2 Ag: NEGATIVE

## 2023-07-21 ENCOUNTER — Encounter (HOSPITAL_COMMUNITY)
Admission: RE | Admit: 2023-07-21 | Discharge: 2023-07-21 | Disposition: A | Discharge status: Home / Self Care | Payer: Medicare Other | Source: Ambulatory Visit | Attending: Hematology & Oncology | Admitting: Hematology & Oncology

## 2023-07-21 DIAGNOSIS — C50911 Malignant neoplasm of unspecified site of right female breast: Secondary | ICD-10-CM | POA: Insufficient documentation

## 2023-07-21 DIAGNOSIS — C785 Secondary malignant neoplasm of large intestine and rectum: Secondary | ICD-10-CM | POA: Diagnosis not present

## 2023-07-21 DIAGNOSIS — C50919 Malignant neoplasm of unspecified site of unspecified female breast: Secondary | ICD-10-CM | POA: Diagnosis not present

## 2023-07-21 LAB — GLUCOSE, CAPILLARY: Glucose-Capillary: 90 mg/dL (ref 70–99)

## 2023-07-21 MED ORDER — FLUDEOXYGLUCOSE F - 18 (FDG) INJECTION
7.9500 | Freq: Once | INTRAVENOUS | Status: AC
  Administered 2023-07-21: 7.95 via INTRAVENOUS

## 2023-07-25 ENCOUNTER — Other Ambulatory Visit: Payer: Self-pay

## 2023-07-25 ENCOUNTER — Inpatient Hospital Stay (HOSPITAL_BASED_OUTPATIENT_CLINIC_OR_DEPARTMENT_OTHER): Payer: Medicare Other | Admitting: Hematology & Oncology

## 2023-07-25 ENCOUNTER — Inpatient Hospital Stay: Payer: Medicare Other | Attending: Hematology & Oncology

## 2023-07-25 VITALS — BP 111/46 | HR 72 | Temp 98.3°F | Resp 17 | Ht 66.0 in | Wt 141.0 lb

## 2023-07-25 DIAGNOSIS — C50912 Malignant neoplasm of unspecified site of left female breast: Secondary | ICD-10-CM | POA: Diagnosis not present

## 2023-07-25 DIAGNOSIS — C785 Secondary malignant neoplasm of large intestine and rectum: Secondary | ICD-10-CM

## 2023-07-25 DIAGNOSIS — C50911 Malignant neoplasm of unspecified site of right female breast: Secondary | ICD-10-CM

## 2023-07-25 DIAGNOSIS — Z1732 Human epidermal growth factor receptor 2 negative status: Secondary | ICD-10-CM | POA: Insufficient documentation

## 2023-07-25 DIAGNOSIS — Z79811 Long term (current) use of aromatase inhibitors: Secondary | ICD-10-CM | POA: Diagnosis not present

## 2023-07-25 DIAGNOSIS — Z170 Estrogen receptor positive status [ER+]: Secondary | ICD-10-CM | POA: Diagnosis not present

## 2023-07-25 DIAGNOSIS — Z1721 Progesterone receptor positive status: Secondary | ICD-10-CM | POA: Insufficient documentation

## 2023-07-25 DIAGNOSIS — M810 Age-related osteoporosis without current pathological fracture: Secondary | ICD-10-CM | POA: Insufficient documentation

## 2023-07-25 LAB — CMP (CANCER CENTER ONLY)
ALT: 11 U/L (ref 0–44)
AST: 18 U/L (ref 15–41)
Albumin: 4 g/dL (ref 3.5–5.0)
Alkaline Phosphatase: 54 U/L (ref 38–126)
Anion gap: 8 (ref 5–15)
BUN: 16 mg/dL (ref 8–23)
CO2: 28 mmol/L (ref 22–32)
Calcium: 9.3 mg/dL (ref 8.9–10.3)
Chloride: 103 mmol/L (ref 98–111)
Creatinine: 1.05 mg/dL — ABNORMAL HIGH (ref 0.44–1.00)
GFR, Estimated: 56 mL/min — ABNORMAL LOW (ref >60)
Glucose, Bld: 79 mg/dL (ref 70–99)
Potassium: 4.7 mmol/L (ref 3.5–5.1)
Sodium: 139 mmol/L (ref 135–145)
Total Bilirubin: 0.3 mg/dL (ref 0.0–1.2)
Total Protein: 6.5 g/dL (ref 6.5–8.1)

## 2023-07-25 LAB — CBC WITH DIFFERENTIAL (CANCER CENTER ONLY)
Abs Immature Granulocytes: 0.02 10*3/uL (ref 0.00–0.07)
Basophils Absolute: 0 10*3/uL (ref 0.0–0.1)
Basophils Relative: 1 %
Eosinophils Absolute: 0 10*3/uL (ref 0.0–0.5)
Eosinophils Relative: 1 %
HCT: 37.3 % (ref 36.0–46.0)
Hemoglobin: 12.4 g/dL (ref 12.0–15.0)
Immature Granulocytes: 0 %
Lymphocytes Relative: 34 %
Lymphs Abs: 2 10*3/uL (ref 0.7–4.0)
MCH: 32.4 pg (ref 26.0–34.0)
MCHC: 33.2 g/dL (ref 30.0–36.0)
MCV: 97.4 fL (ref 80.0–100.0)
Monocytes Absolute: 0.4 10*3/uL (ref 0.1–1.0)
Monocytes Relative: 7 %
Neutro Abs: 3.5 10*3/uL (ref 1.7–7.7)
Neutrophils Relative %: 57 %
Platelet Count: 178 10*3/uL (ref 150–400)
RBC: 3.83 MIL/uL — ABNORMAL LOW (ref 3.87–5.11)
RDW: 13 % (ref 11.5–15.5)
WBC Count: 6 10*3/uL (ref 4.0–10.5)
nRBC: 0 % (ref 0.0–0.2)

## 2023-07-25 NOTE — Progress Notes (Signed)
Hematology and Oncology Follow Up Visit  Martha Osborne [id number] 1951-03-13 73 y.o. 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 daily Ribociclib 600 mg p.o. daily (21/7) -- d/c due to skin rash Prolia 60 mg IM q. 6 months -- next dose on 10/2023       Verzenio 100 mg po day - started on 08/12/2018-changed         10/2021                    Interim History:  Martha Osborne is back for follow-up.  We last saw her back in October.  Since then, she been doing pretty well.  She had no problems over the Holiday season.  They did go down to see a send down in [place].  Her last CA 27.29 was 33.  She has  had a PET scan that was done back in 07/21/2023.  This is still not yet been reported out.  She has had no problems with cough or shortness of breath.  She has had no change in bowel or bladder habits.  There is been no bleeding..  She recently has had issues with bronchitis and sinusitis.  She was on some antibiotics.  She has had no rashes.  There is been no leg swelling.  Overall, I would say that her performance status is probably ECOG 1.   Medications:  Current Outpatient Medications:    abemaciclib (VERZENIO) 100 MG tablet, Take 1 tablet (100 mg total) by mouth daily., Disp: 30 tablet, Rfl: 6   Cholecalciferol (VITAMIN D3 PO), Take 2,000 Units by mouth daily., Disp: , Rfl:    denosumab (PROLIA) 60 MG/ML SOSY injection, Inject 60 mg into the skin every 6 (six) months., Disp: , Rfl:    diclofenac Sodium (VOLTAREN) 1 % GEL, Apply 2 g topically 4 (four) times daily. (Patient not taking: Reported on 07/13/2023), Disp: 150 g, Rfl: 2   Glycerin, PF, (BIOTRUE LUBRICANT) 0.5 % SOLN, Apply 2 drops to eye daily. Both eyes, Disp: , Rfl:    guaiFENesin (MUCINEX) 600 MG 12 hr tablet, Take 1 tablet (600 mg total) by  mouth 2 (two) times daily for 14 days., Disp: 28 tablet, Rfl: 0   letrozole (FEMARA) 2.5 MG tablet, Take 1 tablet (2.5 mg total) by mouth daily., Disp: 90 tablet, Rfl: 6   loperamide (IMODIUM) 2 MG capsule, Take 2 mg by mouth as needed., Disp: , Rfl:    Psyllium (METAMUCIL) 48.57 % POWD, Take 1 packet by mouth daily., Disp: , Rfl:   Allergies:  Allergies  Allergen Reactions   Kisqali (200 Mg Dose) [Ribociclib Succ (200 Mg Dose)] Itching and Rash    scratchy throat.    Past Medical History, Surgical history, Social history, and Family History were reviewed and updated.  Review of Systems: Review of Systems  Constitutional: Negative.   HENT:  Negative.   Eyes: Negative.   Respiratory: Negative.   Cardiovascular: Negative.   Gastrointestinal: Positive for abdominal pain.  Endocrine: Negative.   Genitourinary: Negative.    Musculoskeletal: Negative.   Skin: Negative.   Neurological: Negative.   Hematological: Negative.   Psychiatric/Behavioral: Negative.     Physical Exam:  height is 5\' 6"  (1.676 m) and weight is 141 lb (64 kg). Her oral temperature is 98.3 F (36.8 C). Her blood  pressure is 111/46 (abnormal) and her pulse is 72. Her respiration is 17 and oxygen saturation is 99%.   Wt Readings from Last 3 Encounters:  07/25/23 141 lb (64 kg)  07/13/23 142 lb (64.4 kg)  06/19/23 142 lb 9.6 oz (64.7 kg)    Physical Exam Vitals signs reviewed.  Constitutional:      Comments: Breast exam bilaterally shows no breast masses.  She has no nipple discharge bilaterally.  She has no breast swelling or erythema.  There is no bilateral axillary adenopathy.  HENT:     Head: Normocephalic and atraumatic.  Eyes:     Pupils: Pupils are equal, round, and reactive to light.  Neck:     Musculoskeletal: Normal range of motion.  Cardiovascular:     Rate and Rhythm: Normal rate and regular rhythm.     Heart sounds: Normal heart sounds.  Pulmonary:     Effort: Pulmonary effort is normal.      Breath sounds: Normal breath sounds.  Abdominal:     General: Bowel sounds are normal.     Palpations: Abdomen is soft.     Comments: Abdominal exam shows the healing laparotomy scar.  She has no swelling.  There is no erythema about the surgical site.  She has been tenderness to palpation in the right lower quadrant.  No masses noted.  Bowel sounds are present.  There is no palpable liver or spleen tip.  Musculoskeletal: Normal range of motion.        General: No tenderness or deformity.  Lymphadenopathy:     Cervical: No cervical adenopathy.  Skin:    General: Skin is warm and dry.     Findings: No erythema or rash.  Neurological:     Mental Status: She is alert and oriented to person, place, and time.  Psychiatric:        Behavior: Behavior normal.        Thought Content: Thought content normal.        Judgment: Judgment normal.      Lab Results  Component Value Date   WBC 6.0 07/25/2023   HGB 12.4 07/25/2023   HCT 37.3 07/25/2023   MCV 97.4 07/25/2023   PLT 178 07/25/2023     Chemistry      Component Value Date/Time   NA 139 07/25/2023 1028   K 4.7 07/25/2023 1028   CL 103 07/25/2023 1028   CO2 28 07/25/2023 1028   BUN 16 07/25/2023 1028   CREATININE 1.05 (H) 07/25/2023 1028      Component Value Date/Time   CALCIUM 9.3 07/25/2023 1028   ALKPHOS 54 07/25/2023 1028   AST 18 07/25/2023 1028   ALT 11 07/25/2023 1028   BILITOT 0.3 07/25/2023 1028       Impression and Plan: Martha Osborne is a 73 year old postmenopausal female.  She has metastatic lobular carcinoma of the left breast.  We finally found her primary after doing a breast MRI.  She had resection of the colonic mass.  She had multiple positive lymph nodes.   Again, I do not see any evidence that the breast cancer is progressing.  Everything looks pretty stable to me.  We will have to see what the PET scan shows.  If the PET scan looks okay, then I do not think she needs another 1 probably till  Summer.  We will see what her CA 27.29 looks like.  I would like to see her back in April.  At that time, we  will give her Prolia.   Martha Bach, MD

## 2023-07-26 ENCOUNTER — Encounter: Payer: Self-pay | Admitting: *Deleted

## 2023-07-26 LAB — CANCER ANTIGEN 27.29: CA 27.29: 29.1 U/mL (ref 0.0–38.6)

## 2023-07-31 ENCOUNTER — Telehealth: Payer: Self-pay

## 2023-07-31 NOTE — Telephone Encounter (Signed)
Advised via MyChart.

## 2023-07-31 NOTE — Telephone Encounter (Signed)
-----   Message from Josph Macho sent at 07/31/2023  6:39 AM EST ----- Please call and let her know that the PET scan does not show any obvious active breast cancer.  Thank you.Marland Kitchen

## 2023-08-08 ENCOUNTER — Encounter: Payer: Self-pay | Admitting: Family

## 2023-08-08 ENCOUNTER — Ambulatory Visit (INDEPENDENT_AMBULATORY_CARE_PROVIDER_SITE_OTHER): Payer: Medicare Other | Admitting: Family

## 2023-08-08 VITALS — BP 108/60 | HR 73 | Temp 98.3°F | Resp 16 | Ht 66.0 in | Wt 142.2 lb

## 2023-08-08 DIAGNOSIS — R0981 Nasal congestion: Secondary | ICD-10-CM | POA: Diagnosis not present

## 2023-08-08 MED ORDER — CETIRIZINE HCL 10 MG PO TABS: 10.0000 mg | ORAL_TABLET | Freq: Every day | ORAL | 11 refills | Status: DC

## 2023-08-08 MED ORDER — FLUTICASONE PROPIONATE 50 MCG/ACT NA SUSP: 2.0000 | Freq: Every day | NASAL | 6 refills | Status: DC

## 2023-08-08 NOTE — Progress Notes (Signed)
 Provider: Roxan Aaleah Hirsch FNP-C  Medina-Vargas, Jereld BROCKS, NP  Patient Care Team: Phyllis Jereld BROCKS, NP as PCP - General (Internal Medicine) Timmy Maude SAUNDERS, MD as Medical Oncologist (Oncology) Aron Shoulders, MD as Consulting Physician (General Surgery) Robinson Pao, MD as Consulting Physician (Dermatology) Mat Browning, MD as Consulting Physician (Obstetrics and Gynecology) Corrine Slough, OD (Optometry) Medina-Vargas, Jereld BROCKS, NP as Nurse Practitioner (Internal Medicine)  Extended Emergency Contact Information Primary Emergency Contact: [facility] Address: [address]          [place], [place] [address] [place] Phone: [phone] Relation: Spouse Secondary Emergency Contact: Ardito,doug Mobile Phone: [phone] Relation: Son Interpreter needed? No  Code Status:  DNR Goals of care: Advanced Directive information    08/08/2023   10:09 AM  Advanced Directives  Does Patient Have a Medical Advance Directive? Yes  Type of Estate Agent of [place];Living will;Out of facility DNR (pink MOST or yellow form)  Does patient want to make changes to medical advance directive? No - Patient declined  Copy of Healthcare Power of Attorney in Chart? Yes - validated most recent copy scanned in chart (See row information)     Chief Complaint  Patient presents with   Acute Visit    Patient is being seen for acute visit for some ear issues    Immunizations    Patient is being seen shingles and covid 19 vaccine    Quality Metric Gaps    Patient is due for mammogram and colonoscopy.    Discussed the use of AI scribe software for clinical note transcription with the patient, who gave verbal consent to proceed.  History of Present Illness   BRANDILYN NANNINGA is a 73 year old female who presents with persistent ear congestion and muffled hearing following a recent episode of bronchitis.  Last month, she was treated for  bronchitis, experiencing a severe sore throat and difficulty swallowing. She completed a five-day course of antibiotics and a fourteen-day course of Mucinex , which improved but did not completely resolve her symptoms.  She continues to experience persistent ear congestion and describes her hearing as muffled. Her voice sounds congested to her, although her husband does not perceive it that way. She denies ear pain but is concerned about potential eardrum issues due to a history of ruptured eardrums.  She has been dealing with nasal congestion and a persistent trickling nose for the past five to six weeks. She tried Zyrtec  for three days, which helped reduce the nasal trickling but did not alleviate her primary concern of ear congestion.  She no longer experiences a sore throat but mentions frequent throat clearing due to post-nasal drip. She has not been around anyone sick, although her husband has been coughing a lot.  She has not taken Zyrtec  today and has stopped using saline nasal spray as it seemed ineffective. She is not currently using any other medications for these symptoms.        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, right (HCC) 08/30/2018   Complication of anesthesia    very sensitive to meds   Goals of care, counseling/discussion 08/30/2018   History of bone density study 09/29/2020   History of colonoscopy 11/24/2020   History of EKG 10/10/2018   History of mammogram 01/17/2020   History of MRI    Breast (03/12/2020), Brain (06/13/2019) and Knee (02/23/2018& 05/22/2018)   Osteoporosis    Personal history of  chemotherapy    Shingles    Skin cancer    basal cell x 2 (on back and on nose)   Tinnitus of left ear    Past Surgical History:  Procedure Laterality Date   bone cyst removed Right 1978   BREAST BIOPSY Left 08/28/2018   CATARACT EXTRACTION Bilateral    COLON RESECTION Right 09/14/2018   Procedure: LAPAROSCOPIC   ASSISTED RIGHT COLECTOMY;  Surgeon: Gail Favorite, MD;  Location: [facility] OR;  Service: General;  Laterality: Right;   COLONOSCOPY     LAPAROSCOPIC RIGHT COLECTOMY  09/14/2018    Allergies  Allergen Reactions   Kisqali  (200 Mg Dose) [Ribociclib  Succ (200 Mg Dose)] Itching and Rash    scratchy throat.    Outpatient Encounter Medications as of 08/08/2023  Medication Sig   abemaciclib  (VERZENIO ) 100 MG tablet Take 1 tablet (100 mg total) by mouth daily.   Cholecalciferol (VITAMIN D3 PO) Take 2,000 Units by mouth daily.   denosumab  (PROLIA ) 60 MG/ML SOSY injection Inject 60 mg into the skin every 6 (six) months.   diclofenac  Sodium (VOLTAREN ) 1 % GEL Apply 2 g topically 4 (four) times daily.   Glycerin, PF, (BIOTRUE LUBRICANT) 0.5 % SOLN Apply 2 drops to eye daily. Both eyes   letrozole  (FEMARA ) 2.5 MG tablet Take 1 tablet (2.5 mg total) by mouth daily.   loperamide (IMODIUM) 2 MG capsule Take 2 mg by mouth as needed.   Psyllium (METAMUCIL) 48.57 % POWD Take 1 packet by mouth daily.   No facility-administered encounter medications on file as of 08/08/2023.    Review of Systems  Constitutional:  Negative for appetite change, chills, fatigue, fever and unexpected weight change.  HENT:  Positive for congestion and postnasal drip. Negative for dental problem, ear discharge, ear pain, facial swelling, hearing loss, nosebleeds, rhinorrhea, sinus pressure, sinus pain, sneezing, sore throat, tinnitus and trouble swallowing.        Ear pressure Muffled   Eyes:  Negative for pain, discharge, redness, itching and visual disturbance.  Respiratory:  Negative for cough, chest tightness, shortness of breath and wheezing.   Cardiovascular:  Negative for chest pain, palpitations and leg swelling.  Gastrointestinal:  Negative for abdominal distention, abdominal pain and vomiting.  Musculoskeletal:  Negative for arthralgias, back pain, gait problem, joint swelling and myalgias.  Skin:  Negative for color  change, pallor and rash.  Neurological:  Negative for dizziness, weakness, light-headedness, numbness and headaches.    Immunization History  Administered Date(s) Administered   Influenza, High Dose Seasonal PF 05/01/2019, 05/07/2021, 04/30/2022, 03/04/2023   Influenza, Quadrivalent, Recombinant, Inj, Pf 04/19/2018   Influenza-Unspecified 06/04/2018, 05/01/2019, 04/23/2020   Moderna Covid-19 Fall Seasonal Vaccine 18yrs & older 06/18/2022   Moderna SARS-COV2 Booster Vaccination 01/23/2021   Moderna Sars-Covid-2 Vaccination 08/15/2019, 09/13/2019, 05/15/2020   PFIZER Comirnaty(Gray Top)Covid-19 Tri-Sucrose Vaccine 03/04/2023   Pfizer Covid-19 Vaccine Bivalent Booster 55yrs & up 04/16/2021, 11/05/2021   Pneumococcal Conjugate-13 04/28/2017   Pneumococcal Polysaccharide-23 06/20/2018   Tdap 06/21/2011, 07/08/2022   Zoster Recombinant(Shingrix) 03/22/2012   Pertinent  Health Maintenance Due  Topic Date Due   Colonoscopy  11/25/2023   DEXA SCAN  10/16/2024   MAMMOGRAM  04/27/2025   INFLUENZA VACCINE  Completed      04/11/2023    9:43 AM 04/28/2023   11:06 AM 06/19/2023    1:28 PM 07/13/2023   10:37 AM 08/08/2023   10:08 AM  Fall Risk  Falls in the past year? 0 0 0 0 0  Was  there an injury with Fall? 0 0 0 0 0  Fall Risk Category Calculator 0 0 0 0 0  Patient at Risk for Falls Due to No Fall Risks No Fall Risks No Fall Risks No Fall Risks No Fall Risks  Fall risk Follow up Falls evaluation completed Falls evaluation completed Falls evaluation completed Falls evaluation completed Falls evaluation completed   Functional Status Survey:    Vitals:   08/08/23 1005  BP: 108/60  Pulse: 73  Resp: 16  Temp: 98.3 F (36.8 C)  TempSrc: Temporal  SpO2: 99%  Weight: 142 lb 3.2 oz (64.5 kg)  Height: 5' 6 (1.676 m)   Body mass index is 22.95 kg/m. Physical Exam  VITALS: T- 98.3, P- 73, BP- 108/60, SaO2- 99% MEASUREMENTS: WT- 142 HEENT: Eardrums normal, no wax. Nasal mucosa swollen.  Throat without redness. Pupillary response to light normal. No tenderness over sinuses and forehead. No swollen lymph nodes. CHEST: Lungs clear to auscultation. CARDIOVASCULAR: Heart sounds normal upon auscultation.  ABDOMEN: Soft ,non-tender,non-distended with present bowel sounds  PSYCHIATRY/BEHAVIOR: Mood stable      Labs reviewed: Recent Labs    01/19/23 1145 04/21/23 1201 07/25/23 1028  NA 137 136 139  K 4.9 4.9 4.7  CL 102 100 103  CO2 27 27 28   GLUCOSE 88 100* 79  BUN 17 17 16   CREATININE 1.07* 1.13* 1.05*  CALCIUM 9.9 9.7 9.3   Recent Labs    01/19/23 1145 04/21/23 1201 07/25/23 1028  AST 21 22 18   ALT 12 12 11   ALKPHOS 55 58 54  BILITOT 0.3 0.3 0.3  PROT 6.8 6.8 6.5  ALBUMIN 4.2 4.1 4.0   Recent Labs    01/19/23 1145 04/21/23 1201 07/25/23 1028  WBC 5.3 5.8 6.0  NEUTROABS 3.0 3.5 3.5  HGB 12.8 13.1 12.4  HCT 38.6 39.1 37.3  MCV 98.0 96.5 97.4  PLT 107* 89* 178   No results found for: TSH Lab Results  Component Value Date   HGBA1C 5.4 09/03/2018   Lab Results  Component Value Date   CHOL 178 06/21/2021   HDL 62 06/21/2021   LDLCALC 98 06/21/2021   TRIG 90 06/21/2021   CHOLHDL 2.9 06/21/2021    Significant Diagnostic Results in last 30 days:  NM PET Image Restag (PS) Skull Base To Thigh Result Date: 07/30/2023 CLINICAL DATA:  Subsequent treatment strategy for metastatic breast cancer. EXAM: NUCLEAR MEDICINE PET SKULL BASE TO THIGH TECHNIQUE: 7.95 mCi F-18 FDG was injected intravenously. Full-ring PET imaging was performed from the skull base to thigh after the radiotracer. CT data was obtained and used for attenuation correction and anatomic localization. Fasting blood glucose: 90 mg/dl COMPARISON:  Multiple prior PET CTs.  The most recent is 01/09/2023 FINDINGS: Mediastinal blood pool activity: SUV max 2.30 Liver activity: SUV max NA NECK: No hypermetabolic lymph nodes in the neck. Incidental CT findings: None. CHEST: No hypermetabolic  mediastinal or hilar nodes. No suspicious pulmonary nodules on the CT scan. No hypermetabolic breast lesions, supraclavicular or axillary adenopathy. Incidental CT findings: Stable scattered atherosclerotic calcifications involving the aorta. ABDOMEN/PELVIS: No abnormal hypermetabolic activity within the liver, pancreas, adrenal glands, or spleen. No hypermetabolic lymph nodes in the abdomen or pelvis. Stable surgical changes from prior partial colectomy. No hypermetabolic bowel lesions. Incidental CT findings: Stable atherosclerotic calcifications involving the aorta but no aneurysm. SKELETON: No findings for osseous metastatic disease. Incidental CT findings: None. IMPRESSION: 1. Stable PET-CT scan. No findings for recurrent or metastatic breast cancer. 2.  Stable surgical changes from prior partial colectomy. 3. Aortic atherosclerosis. Aortic Atherosclerosis (ICD10-I70.0). Electronically Signed   By: MYRTIS Stammer M.D.   On: 07/30/2023 09:34    Assessment/Plan  Chronic Nasal Congestion Persistent nasal congestion and muffled hearing for five to six weeks. Initial treatment with antibiotics and Mucinex , followed by Zyrtec  with partial improvement. Examination reveals swollen nasal passages without eardrum rupture or wax buildup. Likely due to ongoing inflammation and congestion. - Prescribe Flonase  nasal spray, two sprays in each nostril once daily or one spray in each nostril twice daily - Continue saline nasal rinses - Consider Claritin or Zyrtec  if nasal drainage persists.7 days course of Zyrtec  samples given  - start on Flonase  50 mcg nasal spray 2 sprays into each Nare once daily  - Encourage increased fluid intake, particularly warm fluids - Emphasize importance of hydration and environmental humidity  Bronchitis Previous episode treated with antibiotics. No current symptoms. Lungs clear on examination. - Monitor for recurrence of symptoms  General Health Maintenance Prefers to avoid  medications when possible, given history of cancer and current cancer medications. Discussed importance of hydration and environmental factors. - Encourage use of a humidifier if air is dry - Ensure adequate hydration, particularly with warm fluids  Follow-up - Follow up if nasal congestion or muffled hearing persists or worsens.      Family/ staff Communication: Reviewed plan of care with patient verbalized understanding   Labs/tests ordered: None   Next Appointment: Return if symptoms worsen or fail to improve.   Total time: minutes. Greater than 50% of total time spent doing patient education regarding nasal congestion  recent bronchitis and health maintenance including symptom/medication management.   Roxan JAYSON Plough, NP

## 2023-08-08 NOTE — Patient Instructions (Signed)
 Postnasal Drip Postnasal drip is the feeling of mucus going down the back of your throat. Mucus is a slimy substance that moistens and cleans your nose and throat, as well as the air pockets in face bones near your forehead and cheeks (sinuses). Small amounts of mucus pass from your nose and sinuses down the back of your throat all the time. This is normal. When you produce too much mucus or the mucus gets too thick, you can feel it. Some common causes of postnasal drip include: Having more mucus because of: A cold or the flu. Allergies. Cold air. Certain medicines. Gastroesophageal reflux. Having more mucus that is thicker because of: A sinus or nasal infection. Dry air. A food allergy. Follow these instructions at home: Relieving discomfort  Gargle with a mixture of salt and water 3-4 times a day or as needed. To make salt water, completely dissolve -1 tsp (3-6 g) of salt in 1 cup (237 mL) of warm water. If the air in your home is dry, use a humidifier to add moisture to the air. Use a saline spray or a container (neti pot) to flush out the nose (nasal irrigation). These methods can help clear away mucus and keep the nasal passages moist. General instructions Take over-the-counter and prescription medicines only as told by your health care provider. Follow instructions from your health care provider about eating or drinking restrictions. You may need to avoid caffeine. Avoid things that you know you are allergic to (allergens), like dust, mold, pollen, pets, or certain foods. Drink enough fluid to keep your urine pale yellow. Keep all follow-up visits. This is important. Contact a health care provider if: You have a fever. You have a sore throat or difficulty swallowing. You have a headache. You have sinus or ear pain. You have a cough that does not go away. The mucus from your nose becomes thick and is green or yellow in color. You have cold or flu symptoms that last more than 10  days. Summary Postnasal drip is the feeling of mucus going down the back of your throat. Use nasal irrigation or a nasal spray to help clear away mucus and keep the nasal passages moist. Avoid things that you know you are allergic to (allergens), like dust, mold, pollen, pets, or certain foods. This information is not intended to replace advice given to you by your health care provider. Make sure you discuss any questions you have with your health care provider. Document Revised: 05/20/2021 Document Reviewed: 05/20/2021 Elsevier Patient Education  2024 [employer].

## 2023-08-10 ENCOUNTER — Other Ambulatory Visit (HOSPITAL_COMMUNITY): Payer: Self-pay

## 2023-08-10 ENCOUNTER — Other Ambulatory Visit (HOSPITAL_COMMUNITY): Payer: Self-pay | Admitting: Pharmacy Technician

## 2023-08-10 NOTE — Progress Notes (Signed)
 Specialty Pharmacy Refill Coordination Note  Martha Osborne is a 73 y.o. female contacted today regarding refills of specialty medication(s) Abemaciclib  (VERZENIO )   Patient requested [name] at [facility] at [place] date: 08/16/23   Medication will be filled on 08/15/23.

## 2023-08-15 ENCOUNTER — Other Ambulatory Visit: Payer: Self-pay

## 2023-08-21 ENCOUNTER — Encounter: Payer: Self-pay | Admitting: Adult Health

## 2023-08-21 ENCOUNTER — Ambulatory Visit (INDEPENDENT_AMBULATORY_CARE_PROVIDER_SITE_OTHER): Payer: Medicare Other | Admitting: Adult Health

## 2023-08-21 VITALS — BP 122/88 | HR 72 | Temp 97.3°F | Resp 18 | Ht 66.0 in | Wt 142.0 lb

## 2023-08-21 DIAGNOSIS — C50911 Malignant neoplasm of unspecified site of right female breast: Secondary | ICD-10-CM | POA: Diagnosis not present

## 2023-08-21 DIAGNOSIS — C785 Secondary malignant neoplasm of large intestine and rectum: Secondary | ICD-10-CM | POA: Diagnosis not present

## 2023-08-21 DIAGNOSIS — H938X2 Other specified disorders of left ear: Secondary | ICD-10-CM | POA: Diagnosis not present

## 2023-08-21 MED ORDER — OXYMETAZOLINE HCL 0.05 % NA SOLN: 1.0000 | Freq: Two times a day (BID) | NASAL | Status: DC | PRN

## 2023-08-21 NOTE — Progress Notes (Signed)
 [facility] clinic  Provider:  Kenard Gower DNP  Code Status:  Full Code  Goals of Care:     08/08/2023   10:09 AM  Advanced Directives  Does Patient Have a Medical Advance Directive? Yes  Type of Estate agent of [place];Living will;Out of facility DNR (pink MOST or yellow form)  Does patient want to make changes to medical advance directive? No - Patient declined  Copy of Healthcare Power of Attorney in Chart? Yes - validated most recent copy scanned in chart (See row information)     Chief Complaint  Patient presents with   Ear Pain    Pressure in the left ear/BB    Discussed the use of AI scribe software for clinical note transcription with the patient, who gave verbal consent to proceed.  HPI: Patient is a 73 y.o. female seen today for an acute visit for left ear congestion.  She has been experiencing pressure in her left ear since February, following episodes of tonsillitis, bronchitis, and sinus congestion. She has been using Flonase, and Zyrtec for the past two weeks, which have alleviated her sinus congestion but not the ear pressure. The sensation is described as fullness and muffled hearing without pain or dizziness. She notes hearing a 'little crack' in the ear last week, which she hoped indicated improvement. No runny nose or earwax buildup is reported, and she has been using saline spray in addition to Flonase.  She has a history of metastatic breast cancer, originally diagnosed in the left breast and later found in the colon. She underwent a hemicolectomy in 2020 and reports that her stool consistency is loose, attributed to the partial colon removal. She manages this with Metamucil. She rarely needs to take Imodium.  Her current medications include Flonase, two sprays in each nostril daily, Zyrtec 10 mg daily, and Prolia injections every six months for osteoporosis, with the last injection in October. She also takes Verzenio for her breast  cancer.       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, right (HCC) 08/30/2018   Complication of anesthesia    very sensitive to meds   Goals of care, counseling/discussion 08/30/2018   History of bone density study 09/29/2020   History of colonoscopy 11/24/2020   History of EKG 10/10/2018   History of mammogram 01/17/2020   History of MRI    Breast (03/12/2020), Brain (06/13/2019) and Knee (02/23/2018& 05/22/2018)   Osteoporosis    Personal history of chemotherapy    Shingles    Skin cancer    basal cell x 2 (on back and on nose)   Tinnitus of left ear     Past Surgical History:  Procedure Laterality Date   bone cyst removed Right 1978   BREAST BIOPSY Left 08/28/2018   CATARACT EXTRACTION Bilateral    COLON RESECTION Right 09/14/2018   Procedure: LAPAROSCOPIC  ASSISTED RIGHT COLECTOMY;  Surgeon: Claud Kelp, MD;  Location: [facility] OR;  Service: General;  Laterality: Right;   COLONOSCOPY     LAPAROSCOPIC RIGHT COLECTOMY  09/14/2018    Allergies  Allergen Reactions   Kisqali (200 Mg Dose) [Ribociclib Succ (200 Mg Dose)] Itching and Rash    scratchy throat.    Outpatient Encounter Medications as of 08/21/2023  Medication Sig   abemaciclib (VERZENIO) 100 MG tablet Take 1 tablet (100 mg total) by mouth daily.   cetirizine (ZYRTEC) 10 MG tablet Take 1 tablet (10 mg total) by mouth  daily.   Cholecalciferol (VITAMIN D3 PO) Take 2,000 Units by mouth daily.   denosumab (PROLIA) 60 MG/ML SOSY injection Inject 60 mg into the skin every 6 (six) months.   diclofenac Sodium (VOLTAREN) 1 % GEL Apply 2 g topically 4 (four) times daily.   fluticasone (FLONASE) 50 MCG/ACT nasal spray Place 2 sprays into both nostrils daily.   Glycerin, PF, (BIOTRUE LUBRICANT) 0.5 % SOLN Apply 2 drops to eye daily. Both eyes   letrozole (FEMARA) 2.5 MG tablet Take 1 tablet (2.5 mg total) by mouth daily.   loperamide (IMODIUM) 2 MG capsule Take 2 mg by mouth as  needed.   oxymetazoline (AFRIN NASAL SPRAY) 0.05 % nasal spray Place 1 spray into both nostrils 2 (two) times daily as needed for congestion.   Psyllium (METAMUCIL) 48.57 % POWD Take 1 packet by mouth daily.   No facility-administered encounter medications on file as of 08/21/2023.    Review of Systems:  Review of Systems  Constitutional:  Negative for appetite change, chills, fatigue and fever.  HENT:  Positive for congestion. Negative for hearing loss, rhinorrhea and sore throat.        Left ear congestion  Eyes: Negative.   Respiratory:  Negative for cough, shortness of breath and wheezing.   Cardiovascular:  Negative for chest pain, palpitations and leg swelling.  Gastrointestinal:  Negative for abdominal pain, constipation, diarrhea, nausea and vomiting.  Genitourinary:  Negative for dysuria.  Musculoskeletal:  Negative for arthralgias, back pain and myalgias.  Skin:  Negative for color change, rash and wound.  Neurological:  Negative for dizziness, weakness and headaches.  Psychiatric/Behavioral:  Negative for behavioral problems. The patient is not nervous/anxious.     Health Maintenance  Topic Date Due   Zoster Vaccines- Shingrix (2 of 2) 05/17/2012   COVID-19 Vaccine (8 - 2024-25 season) 04/29/2023   Colonoscopy  11/25/2023   Medicare Annual Wellness (AWV)  04/10/2024   DEXA SCAN  10/16/2024   MAMMOGRAM  04/27/2025   DTaP/Tdap/Td (3 - Td or Tdap) 07/08/2032   Pneumonia Vaccine 42+ Years old  Completed   INFLUENZA VACCINE  Completed   Hepatitis C Screening  Completed   HPV VACCINES  Aged Out    Physical Exam: Vitals:   08/21/23 1323  BP: 122/88  Pulse: 72  Resp: 18  Temp: (!) 97.3 F (36.3 C)  SpO2: 97%  Weight: 142 lb (64.4 kg)  Height: 5\' 6"  (1.676 m)   Body mass index is 22.92 kg/m. Physical Exam Constitutional:      Appearance: Normal appearance.  HENT:     Head: Normocephalic and atraumatic.     Nose: Nose normal.     Mouth/Throat:     Mouth:  Mucous membranes are moist.  Eyes:     Conjunctiva/sclera: Conjunctivae normal.  Cardiovascular:     Rate and Rhythm: Normal rate and regular rhythm.  Pulmonary:     Effort: Pulmonary effort is normal.     Breath sounds: Normal breath sounds.  Abdominal:     General: Bowel sounds are normal.     Palpations: Abdomen is soft.  Musculoskeletal:        General: Normal range of motion.     Cervical back: Normal range of motion.  Skin:    General: Skin is warm and dry.  Neurological:     General: No focal deficit present.     Mental Status: She is alert and oriented to person, place, and time.  Psychiatric:  Mood and Affect: Mood normal.        Behavior: Behavior normal.        Thought Content: Thought content normal.        Judgment: Judgment normal.     Labs reviewed: Basic Metabolic Panel: Recent Labs    01/19/23 1145 04/21/23 1201 07/25/23 1028  NA 137 136 139  K 4.9 4.9 4.7  CL 102 100 103  CO2 27 27 28   GLUCOSE 88 100* 79  BUN 17 17 16   CREATININE 1.07* 1.13* 1.05*  CALCIUM 9.9 9.7 9.3   Liver Function Tests: Recent Labs    01/19/23 1145 04/21/23 1201 07/25/23 1028  AST 21 22 18   ALT 12 12 11   ALKPHOS 55 58 54  BILITOT 0.3 0.3 0.3  PROT 6.8 6.8 6.5  ALBUMIN 4.2 4.1 4.0   No results for input(s): "LIPASE", "AMYLASE" in the last 8760 hours. No results for input(s): "AMMONIA" in the last 8760 hours. CBC: Recent Labs    01/19/23 1145 04/21/23 1201 07/25/23 1028  WBC 5.3 5.8 6.0  NEUTROABS 3.0 3.5 3.5  HGB 12.8 13.1 12.4  HCT 38.6 39.1 37.3  MCV 98.0 96.5 97.4  PLT 107* 89* 178   Lipid Panel: No results for input(s): "CHOL", "HDL", "LDLCALC", "TRIG", "CHOLHDL", "LDLDIRECT" in the last 8760 hours. Lab Results  Component Value Date   HGBA1C 5.4 09/03/2018    Procedures since last visit: No results found.  Assessment/Plan  1. Congestion of left ear (Primary) -  Persistent pressure and muffled hearing in the left ear despite use of  Flonase and Zyrtec for two weeks. No associated pain or dizziness. Otoscopic examination revealed no abnormalities. -Continue Flonase and Zyrtec. -Add Afrin nasal spray PRN - steam inhalation PRN - oxymetazoline (AFRIN NASAL SPRAY) 0.05 % nasal spray; Place 1 spray into both nostrils 2 (two) times daily as needed for congestion. - Ambulatory referral to ENT  2. 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 Currently on Verzenio. -Continue current cancer treatment. -Follow-up with oncology      Labs/tests ordered:   None    Jaslynne Dahan Medina-Vargas, NP

## 2023-08-25 ENCOUNTER — Ambulatory Visit
Admission: RE | Admit: 2023-08-25 | Discharge: 2023-08-25 | Disposition: A | Discharge status: Home / Self Care | Payer: Medicare Other | Source: Ambulatory Visit | Attending: General Surgery | Admitting: General Surgery

## 2023-08-25 DIAGNOSIS — Z08 Encounter for follow-up examination after completed treatment for malignant neoplasm: Secondary | ICD-10-CM | POA: Diagnosis not present

## 2023-08-25 DIAGNOSIS — R92333 Mammographic heterogeneous density, bilateral breasts: Secondary | ICD-10-CM | POA: Diagnosis not present

## 2023-08-25 DIAGNOSIS — Z853 Personal history of malignant neoplasm of breast: Secondary | ICD-10-CM | POA: Diagnosis not present

## 2023-08-25 DIAGNOSIS — C50912 Malignant neoplasm of unspecified site of left female breast: Secondary | ICD-10-CM | POA: Diagnosis not present

## 2023-09-05 ENCOUNTER — Other Ambulatory Visit (HOSPITAL_COMMUNITY): Payer: Self-pay

## 2023-09-05 ENCOUNTER — Other Ambulatory Visit: Payer: Self-pay

## 2023-09-05 NOTE — Progress Notes (Signed)
 Specialty Pharmacy Refill Coordination Note  Martha Osborne is a 73 y.o. female contacted today regarding refills of specialty medication(s) Abemaciclib [name])   Patient requested (Patient-Rptd) Pickup at [facility] at [facility] date: (Patient-Rptd) 09/11/23   Medication will be filled on 03.07.25.

## 2023-09-06 DIAGNOSIS — D485 Neoplasm of uncertain behavior of skin: Secondary | ICD-10-CM | POA: Diagnosis not present

## 2023-09-06 DIAGNOSIS — L570 Actinic keratosis: Secondary | ICD-10-CM | POA: Diagnosis not present

## 2023-09-07 ENCOUNTER — Other Ambulatory Visit: Payer: Self-pay

## 2023-09-11 ENCOUNTER — Other Ambulatory Visit (HOSPITAL_COMMUNITY): Payer: Self-pay

## 2023-09-14 ENCOUNTER — Institutional Professional Consult (permissible substitution) (INDEPENDENT_AMBULATORY_CARE_PROVIDER_SITE_OTHER): Payer: Medicare Other

## 2023-10-02 DIAGNOSIS — H18413 Arcus senilis, bilateral: Secondary | ICD-10-CM | POA: Diagnosis not present

## 2023-10-02 DIAGNOSIS — H02831 Dermatochalasis of right upper eyelid: Secondary | ICD-10-CM | POA: Diagnosis not present

## 2023-10-02 DIAGNOSIS — Z961 Presence of intraocular lens: Secondary | ICD-10-CM | POA: Diagnosis not present

## 2023-10-02 DIAGNOSIS — H04123 Dry eye syndrome of bilateral lacrimal glands: Secondary | ICD-10-CM | POA: Diagnosis not present

## 2023-10-04 ENCOUNTER — Other Ambulatory Visit: Payer: Self-pay

## 2023-10-04 ENCOUNTER — Other Ambulatory Visit (HOSPITAL_COMMUNITY): Payer: Self-pay

## 2023-10-04 NOTE — Progress Notes (Signed)
 Specialty Pharmacy Refill Coordination Note  Martha Osborne is a 73 y.o. female contacted today regarding refills of specialty medication(s) Abemaciclib [name])   Patient requested (Patient-Rptd) Pickup at [facility] at [facility] date: (Patient-Rptd) 10/11/23   Medication will be filled on 10/10/23.

## 2023-10-05 ENCOUNTER — Other Ambulatory Visit: Payer: Self-pay

## 2023-10-05 NOTE — Progress Notes (Signed)
 Clinical Intervention Note  Clinical Intervention Notes: Patient reports initiating treatment with Veyve. No DDIs identified with Verzenio due to topical application.   Clinical Intervention Outcomes: Prevention of an adverse drug event   Otto Herb Specialty Pharmacist

## 2023-10-14 DIAGNOSIS — Z23 Encounter for immunization: Secondary | ICD-10-CM | POA: Diagnosis not present

## 2023-10-16 DIAGNOSIS — Z124 Encounter for screening for malignant neoplasm of cervix: Secondary | ICD-10-CM | POA: Diagnosis not present

## 2023-10-16 DIAGNOSIS — Z6822 Body mass index (BMI) 22.0-22.9, adult: Secondary | ICD-10-CM | POA: Diagnosis not present

## 2023-10-24 ENCOUNTER — Inpatient Hospital Stay: Payer: Medicare Other

## 2023-10-24 ENCOUNTER — Inpatient Hospital Stay: Payer: Medicare Other | Attending: Hematology & Oncology

## 2023-10-24 ENCOUNTER — Encounter: Payer: Self-pay | Admitting: Hematology & Oncology

## 2023-10-24 ENCOUNTER — Inpatient Hospital Stay (HOSPITAL_BASED_OUTPATIENT_CLINIC_OR_DEPARTMENT_OTHER): Payer: Medicare Other | Admitting: Hematology & Oncology

## 2023-10-24 VITALS — BP 115/70 | HR 75 | Temp 98.1°F | Resp 18 | Wt 137.8 lb

## 2023-10-24 DIAGNOSIS — C50912 Malignant neoplasm of unspecified site of left female breast: Secondary | ICD-10-CM | POA: Insufficient documentation

## 2023-10-24 DIAGNOSIS — C50911 Malignant neoplasm of unspecified site of right female breast: Secondary | ICD-10-CM

## 2023-10-24 DIAGNOSIS — C785 Secondary malignant neoplasm of large intestine and rectum: Secondary | ICD-10-CM | POA: Diagnosis not present

## 2023-10-24 DIAGNOSIS — M810 Age-related osteoporosis without current pathological fracture: Secondary | ICD-10-CM | POA: Insufficient documentation

## 2023-10-24 DIAGNOSIS — Z9049 Acquired absence of other specified parts of digestive tract: Secondary | ICD-10-CM | POA: Diagnosis not present

## 2023-10-24 DIAGNOSIS — Z1721 Progesterone receptor positive status: Secondary | ICD-10-CM | POA: Diagnosis not present

## 2023-10-24 DIAGNOSIS — Z170 Estrogen receptor positive status [ER+]: Secondary | ICD-10-CM | POA: Insufficient documentation

## 2023-10-24 DIAGNOSIS — Z1732 Human epidermal growth factor receptor 2 negative status: Secondary | ICD-10-CM | POA: Diagnosis not present

## 2023-10-24 LAB — CMP (CANCER CENTER ONLY)
ALT: 12 U/L (ref 0–44)
AST: 19 U/L (ref 15–41)
Albumin: 4.1 g/dL (ref 3.5–5.0)
Alkaline Phosphatase: 60 U/L (ref 38–126)
Anion gap: 7 (ref 5–15)
BUN: 18 mg/dL (ref 8–23)
CO2: 29 mmol/L (ref 22–32)
Calcium: 9.4 mg/dL (ref 8.9–10.3)
Chloride: 104 mmol/L (ref 98–111)
Creatinine: 1.17 mg/dL — ABNORMAL HIGH (ref 0.44–1.00)
GFR, Estimated: 49 mL/min — ABNORMAL LOW (ref >60)
Glucose, Bld: 87 mg/dL (ref 70–99)
Potassium: 4.5 mmol/L (ref 3.5–5.1)
Sodium: 140 mmol/L (ref 135–145)
Total Bilirubin: 0.3 mg/dL (ref 0.0–1.2)
Total Protein: 6.5 g/dL (ref 6.5–8.1)

## 2023-10-24 LAB — CBC WITH DIFFERENTIAL (CANCER CENTER ONLY)
Abs Immature Granulocytes: 0.02 10*3/uL (ref 0.00–0.07)
Basophils Absolute: 0 10*3/uL (ref 0.0–0.1)
Basophils Relative: 1 %
Eosinophils Absolute: 0.1 10*3/uL (ref 0.0–0.5)
Eosinophils Relative: 1 %
HCT: 37.2 % (ref 36.0–46.0)
Hemoglobin: 12.7 g/dL (ref 12.0–15.0)
Immature Granulocytes: 0 %
Lymphocytes Relative: 34 %
Lymphs Abs: 1.7 10*3/uL (ref 0.7–4.0)
MCH: 33 pg (ref 26.0–34.0)
MCHC: 34.1 g/dL (ref 30.0–36.0)
MCV: 96.6 fL (ref 80.0–100.0)
Monocytes Absolute: 0.4 10*3/uL (ref 0.1–1.0)
Monocytes Relative: 7 %
Neutro Abs: 2.8 10*3/uL (ref 1.7–7.7)
Neutrophils Relative %: 57 %
Platelet Count: 113 10*3/uL — ABNORMAL LOW (ref 150–400)
RBC: 3.85 MIL/uL — ABNORMAL LOW (ref 3.87–5.11)
RDW: 13.2 % (ref 11.5–15.5)
WBC Count: 5 10*3/uL (ref 4.0–10.5)
nRBC: 0 % (ref 0.0–0.2)

## 2023-10-24 LAB — LACTATE DEHYDROGENASE: LDH: 111 U/L (ref 98–192)

## 2023-10-24 MED ORDER — DENOSUMAB 60 MG/ML ~~LOC~~ SOSY
60.0000 mg | PREFILLED_SYRINGE | Freq: Once | SUBCUTANEOUS | Status: AC
  Administered 2023-10-24: 60 mg via SUBCUTANEOUS
  Filled 2023-10-24: qty 1

## 2023-10-24 NOTE — Progress Notes (Signed)
 Hematology and Oncology Follow Up Visit  Martha Osborne [id number] 10/02/1950 73 y.o. 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 daily Ribociclib  600 mg p.o. daily (21/7) -- d/c due to skin rash Prolia  60 mg IM q. 6 months -- next dose on 04/2024       Verzenio  100 mg po day - started on 08/12/2018-changed         10/2021                    Interim History:  Martha Osborne is back for follow-up.  We last saw her back in January.  At that time, she had a PET scan that was done.  The results came back.  Everything looks stable without any evidence of metastatic disease.  Her last CA 27.29 was holding steady at 29.  She and her husband will be getting ready to go over to [place].  That were going over for almost 3 weeks.  She is a little bit worried about the trip.  She is little bit worried about getting dehydrated.  Hopefully that will not be a problem for her.  She has had no problems with pain.  She says her back hurts a little bit when she plays golf.  She has had no problems with diarrhea.  She has had no cough or shortness of breath.  She has had no rashes.  There is been no bleeding.  She has had no leg swelling.  Overall, I would have to say that her performance status is probably ECOG 1.   Medications:  Current Outpatient Medications:    abemaciclib  (VERZENIO ) 100 MG tablet, Take 1 tablet (100 mg total) by mouth daily., Disp: 30 tablet, Rfl: 6   cetirizine  (ZYRTEC ) 10 MG tablet, Take 1 tablet (10 mg total) by mouth daily., Disp: 30 tablet, Rfl: 11   Cholecalciferol (VITAMIN D3 PO), Take 2,000 Units by mouth daily., Disp: , Rfl:    denosumab  (PROLIA ) 60 MG/ML SOSY injection, Inject 60 mg into the skin every 6 (six) months., Disp: , Rfl:    diclofenac  Sodium (VOLTAREN ) 1 % GEL, Apply 2 g topically 4  (four) times daily., Disp: 150 g, Rfl: 2   fluticasone  (FLONASE ) 50 MCG/ACT nasal spray, Place 2 sprays into both nostrils daily., Disp: 16 g, Rfl: 6   Glycerin, PF, (BIOTRUE LUBRICANT) 0.5 % SOLN, Apply 2 drops to eye daily. Both eyes, Disp: , Rfl:    letrozole  (FEMARA ) 2.5 MG tablet, Take 1 tablet (2.5 mg total) by mouth daily., Disp: 90 tablet, Rfl: 6   loperamide (IMODIUM) 2 MG capsule, Take 2 mg by mouth as needed., Disp: , Rfl:    Psyllium (METAMUCIL) 48.57 % POWD, Take 1 packet by mouth daily., Disp: , Rfl:    VEVYE 0.1 % SOLN, Place 1 drop into both eyes daily at 6 (six) AM., Disp: , Rfl:    oxymetazoline  (AFRIN NASAL SPRAY) 0.05 % nasal spray, Place 1 spray into both nostrils 2 (two) times daily as needed for congestion. (Patient not taking: Reported on 10/24/2023), Disp: , Rfl:  No current facility-administered medications for this visit.  Facility-Administered Medications Ordered in Other Visits:    denosumab  (PROLIA ) injection 60 mg, 60 mg, Subcutaneous, Once, Martha Osborne, Martha Donald, MD  Allergies:  Allergies  Allergen Reactions   Kisqali  (200 Mg  Dose) [Ribociclib  Succ (200 Mg Dose)] Itching and Rash    scratchy throat.    Past Medical History, Surgical history, Social history, and Family History were reviewed and updated.  Review of Systems: Review of Systems  Constitutional: Negative.   HENT:  Negative.   Eyes: Negative.   Respiratory: Negative.   Cardiovascular: Negative.   Gastrointestinal: Positive for abdominal pain.  Endocrine: Negative.   Genitourinary: Negative.    Musculoskeletal: Negative.   Skin: Negative.   Neurological: Negative.   Hematological: Negative.   Psychiatric/Behavioral: Negative.     Physical Exam:  weight is 137 lb 12.8 oz (62.5 kg). Her oral temperature is 98.1 F (36.7 C). Her blood pressure is 115/70 and her pulse is 75. Her respiration is 18 and oxygen saturation is 99%.   Wt Readings from Last 3 Encounters:  10/24/23 137 lb 12.8 oz  (62.5 kg)  08/21/23 142 lb (64.4 kg)  08/08/23 142 lb 3.2 oz (64.5 kg)    Physical Exam Vitals signs reviewed.  Constitutional:      Comments: Breast exam bilaterally shows no breast masses.  She has no nipple discharge bilaterally.  She has no breast swelling or erythema.  There is no bilateral axillary adenopathy.  HENT:     Head: Normocephalic and atraumatic.  Eyes:     Pupils: Pupils are equal, round, and reactive to light.  Neck:     Musculoskeletal: Normal range of motion.  Cardiovascular:     Rate and Rhythm: Normal rate and regular rhythm.     Heart sounds: Normal heart sounds.  Pulmonary:     Effort: Pulmonary effort is normal.     Breath sounds: Normal breath sounds.  Abdominal:     General: Bowel sounds are normal.     Palpations: Abdomen is soft.     Comments: Abdominal exam shows the healing laparotomy scar.  She has no swelling.  There is no erythema about the surgical site.  She has been tenderness to palpation in the right lower quadrant.  No masses noted.  Bowel sounds are present.  There is no palpable liver or spleen tip.  Musculoskeletal: Normal range of motion.        General: No tenderness or deformity.  Lymphadenopathy:     Cervical: No cervical adenopathy.  Skin:    General: Skin is warm and dry.     Findings: No erythema or rash.  Neurological:     Mental Status: She is alert and oriented to person, place, and time.  Psychiatric:        Behavior: Behavior normal.        Thought Content: Thought content normal.        Judgment: Judgment normal.      Lab Results  Component Value Date   WBC 5.0 10/24/2023   HGB 12.7 10/24/2023   HCT 37.2 10/24/2023   MCV 96.6 10/24/2023   PLT 113 (L) 10/24/2023     Chemistry      Component Value Date/Time   NA 140 10/24/2023 1030   K 4.5 10/24/2023 1030   CL 104 10/24/2023 1030   CO2 29 10/24/2023 1030   BUN 18 10/24/2023 1030   CREATININE 1.17 (H) 10/24/2023 1030      Component Value Date/Time    CALCIUM 9.4 10/24/2023 1030   ALKPHOS 60 10/24/2023 1030   AST 19 10/24/2023 1030   ALT 12 10/24/2023 1030   BILITOT 0.3 10/24/2023 1030       Impression and Plan:  Martha Osborne is a 73 year old postmenopausal female.  She has metastatic lobular carcinoma of the left breast.  We finally found her primary after doing a breast MRI.  She had resection of the colonic mass.  She had multiple positive lymph nodes.   Again, I do not see any evidence that the breast cancer is progressing.    I will plan to set up another PET scan in August.  I know she will be traveling around that time.  We can always arrange to have the PET scan done at her convenience.  I will plan to see her back sometime in August if she is in town.  Am just happy that she can be going on a cruise.  I know she will have a good time.  I know that she will hydrate as well as possible.   Rayleen Cal, MD

## 2023-10-25 LAB — CANCER ANTIGEN 27.29: CA 27.29: 32.1 U/mL (ref 0.0–38.6)

## 2023-10-31 ENCOUNTER — Other Ambulatory Visit: Payer: Self-pay | Admitting: Hematology & Oncology

## 2023-10-31 ENCOUNTER — Other Ambulatory Visit: Payer: Self-pay

## 2023-10-31 ENCOUNTER — Other Ambulatory Visit (HOSPITAL_COMMUNITY): Payer: Self-pay

## 2023-10-31 ENCOUNTER — Other Ambulatory Visit: Payer: Self-pay | Admitting: Pharmacy Technician

## 2023-10-31 MED ORDER — ABEMACICLIB 100 MG PO TABS
100.0000 mg | ORAL_TABLET | Freq: Every day | ORAL | 6 refills | Status: DC
  Filled 2023-11-01: qty 28, 28d supply, fill #0
  Filled 2023-12-01: qty 28, 28d supply, fill #1
  Filled 2023-12-25: qty 28, 28d supply, fill #2
  Filled 2024-01-22: qty 28, 28d supply, fill #3
  Filled 2024-02-14 (×2): qty 28, 28d supply, fill #4
  Filled 2024-03-19: qty 28, 28d supply, fill #5
  Filled 2024-04-16: qty 28, 28d supply, fill #6

## 2023-10-31 NOTE — Progress Notes (Signed)
 Specialty Pharmacy Refill Coordination Note  Martha Osborne is a 73 y.o. female contacted today regarding refills of specialty medication(s) Abemaciclib  (VERZENIO )   Patient requested [place] at [facility] at [place] date: 11/07/23   Medication will be filled on 11/07/23  This fill date is pending response to refill request from provider. Patient is aware and if they have not received fill by intended date they must follow up with pharmacy.

## 2023-11-01 ENCOUNTER — Other Ambulatory Visit: Payer: Self-pay

## 2023-11-06 ENCOUNTER — Other Ambulatory Visit: Payer: Self-pay

## 2023-12-01 ENCOUNTER — Other Ambulatory Visit: Payer: Self-pay

## 2023-12-01 NOTE — Progress Notes (Signed)
 Specialty Pharmacy Refill Coordination Note  Martha Osborne is a 73 y.o. female contacted today regarding refills of specialty medication(s) Abemaciclib  (VERZENIO )   Patient requested [place] [facility] at [facility] at [place] date: 12/06/23   Medication will be filled on 06.03.25.

## 2023-12-05 ENCOUNTER — Encounter: Payer: Self-pay | Admitting: Family

## 2023-12-05 ENCOUNTER — Ambulatory Visit: Admitting: Family

## 2023-12-05 VITALS — BP 120/68 | HR 69 | Temp 97.6°F | Ht 66.0 in | Wt 138.6 lb

## 2023-12-05 DIAGNOSIS — R053 Chronic cough: Secondary | ICD-10-CM

## 2023-12-05 LAB — POCT INFLUENZA A/B
Influenza A, POC: NEGATIVE
Influenza B, POC: NEGATIVE

## 2023-12-05 LAB — POC COVID19 BINAXNOW: SARS Coronavirus 2 Ag: NEGATIVE

## 2023-12-05 MED ORDER — DOXYCYCLINE HYCLATE 100 MG PO TABS: 100.0000 mg | ORAL_TABLET | Freq: Two times a day (BID) | ORAL | 0 refills | Status: AC

## 2023-12-05 NOTE — Progress Notes (Signed)
 Provider: Nedra Ball Bronwen Pendergraft FNP-C  Medina-Vargas, Sid Dragon, NP  Patient Care Team: Duncan Gibson, NP as PCP - General (Internal Medicine) Ivor Mars, MD as Medical Oncologist (Oncology) Lockie Rima, MD as Consulting Physician (General Surgery) Thais Fill, MD as Consulting Physician (Dermatology) Thurman Flores, MD as Consulting Physician (Obstetrics and Gynecology) Alexandro Angelica, OD (Optometry) Medina-Vargas, Sid Dragon, NP as Nurse Practitioner (Internal Medicine)  Extended Emergency Contact Information Primary Emergency Contact: [facility] Address: [address]          [place], [place] [address] [place] Phone: [phone] Relation: Spouse Secondary Emergency Contact: Spada,doug Mobile Phone: [phone] Relation: Son Interpreter needed? No  Code Status:  DNR Goals of care: Advanced Directive information    10/24/2023   10:56 AM  Advanced Directives  Does Patient Have a Medical Advance Directive? Yes  Type of Advance Directive Living will  Copy of Healthcare Power of Attorney in Chart? Yes - validated most recent copy scanned in chart (See row information)  Would patient like information on creating a medical advance directive? No - Patient declined     Chief Complaint  Patient presents with   Cough    Not coughing up a lot of phlegm/ partial nasal congestion  Patient has taken 73 year old Prednisone  Rx 8 day supply finished this morning and also taking OTC mucinex  since last friday    Discussed the use of AI scribe software for clinical note transcription with the patient, who gave verbal consent to proceed.  History of Present Illness   Martha Osborne is a 73 year old female who presents with a persistent cough following a recent trip to [place].  She developed a significant cold after catching it from her husband while traveling in [place]. The cold symptoms began on May 20th and quickly progressed to her chest,  resulting in violent and embarrassing coughing spells. She was unable to find an expectorant while abroad and reports that her symptoms continued as she was not getting enough rest and was exposed to cold, windy, and rainy weather.  In an attempt to manage her symptoms, she self-medicated with a [age]-year-old prescription of prednisone , taking 10 mg daily for eight days, which she completed recently. Upon returning home, she began taking generic Mucinex  since Friday. Initially, she noticed some improvement, but her symptoms have since plateaued. She continues to experience coughing but is not able to expectorate enough mucus.  No chest pain, chest tightness, wheezing, fever, chills, or shortness of breath. She managed well during her flight home and has been able to carry her luggage through airports and train stations without difficulty. Her appetite remains good, and she has been drinking plenty of fluids. She notes a slight weight loss, which began before her trip.  Her husband, who also had a cold, has since recovered. She mentions a slight head congestion, possibly due to pollen, but has refrained from taking Zyrtec  to avoid adding more medications to her regimen.  She has no history of smoking and is allergic to the eye cancer medication Kisqali .    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, right (HCC) 08/30/2018   Complication of anesthesia    very sensitive to meds   Goals of care, counseling/discussion 08/30/2018   History of bone density study 09/29/2020   History of colonoscopy 11/24/2020   History of EKG 10/10/2018   History of mammogram 01/17/2020   History of MRI  Breast (03/12/2020), Brain (06/13/2019) and Knee (02/23/2018& 05/22/2018)   Osteoporosis    Personal history of chemotherapy    Shingles    Skin cancer    basal cell x 2 (on back and on nose)   Tinnitus of left ear    Past Surgical History:  Procedure Laterality Date    bone cyst removed Right 1978   BREAST BIOPSY Left 08/28/2018   CATARACT EXTRACTION Bilateral    COLON RESECTION Right 09/14/2018   Procedure: LAPAROSCOPIC  ASSISTED RIGHT COLECTOMY;  Surgeon: Boyce Byes, MD;  Location: [facility] OR;  Service: General;  Laterality: Right;   COLONOSCOPY     LAPAROSCOPIC RIGHT COLECTOMY  09/14/2018    Allergies  Allergen Reactions   Kisqali  (200 Mg Dose) [Ribociclib  Succ (200 Mg Dose)] Itching and Rash    scratchy throat.    Outpatient Encounter Medications as of 12/05/2023  Medication Sig   abemaciclib  (VERZENIO ) 100 MG tablet Take 1 tablet (100 mg total) by mouth daily.   cetirizine  (ZYRTEC ) 10 MG tablet Take 1 tablet (10 mg total) by mouth daily.   Cholecalciferol (VITAMIN D3 PO) Take 2,000 Units by mouth daily.   denosumab  (PROLIA ) 60 MG/ML SOSY injection Inject 60 mg into the skin every 6 (six) months.   diclofenac  Sodium (VOLTAREN ) 1 % GEL Apply 2 g topically 4 (four) times daily.   doxycycline  (VIBRA -TABS) 100 MG tablet Take 1 tablet (100 mg total) by mouth 2 (two) times daily for 7 days.   fluticasone  (FLONASE ) 50 MCG/ACT nasal spray Place 2 sprays into both nostrils daily.   letrozole  (FEMARA ) 2.5 MG tablet Take 1 tablet (2.5 mg total) by mouth daily.   loperamide (IMODIUM) 2 MG capsule Take 2 mg by mouth as needed.   Psyllium (METAMUCIL) 48.57 % POWD Take 1 packet by mouth daily.   VEVYE 0.1 % SOLN Place 1 drop into both eyes daily at 6 (six) AM.   Glycerin, PF, (BIOTRUE LUBRICANT) 0.5 % SOLN Apply 2 drops to eye daily. Both eyes (Patient not taking: Reported on 12/05/2023)   oxymetazoline  (AFRIN NASAL SPRAY) 0.05 % nasal spray Place 1 spray into both nostrils 2 (two) times daily as needed for congestion. (Patient not taking: Reported on 12/05/2023)   No facility-administered encounter medications on file as of 12/05/2023.    Review of Systems  Constitutional:  Negative for appetite change, chills, fatigue, fever and unexpected weight change.  HENT:   Negative for congestion, ear discharge, ear pain, hearing loss, nosebleeds, postnasal drip, rhinorrhea, sinus pressure, sinus pain, sneezing, sore throat, tinnitus and trouble swallowing.   Eyes:  Negative for pain, discharge, redness, itching and visual disturbance.  Respiratory:  Positive for cough. Negative for chest tightness, shortness of breath and wheezing.   Cardiovascular:  Negative for chest pain, palpitations and leg swelling.  Gastrointestinal:  Negative for abdominal distention, abdominal pain, nausea and vomiting.  Skin:  Negative for color change, pallor and rash.  Neurological:  Negative for dizziness, weakness, light-headedness and headaches.    Immunization History  Administered Date(s) Administered   Fluzone Influenza virus vaccine,trivalent (IIV3), split virus 04/16/2009, 05/22/2011   Influenza, High Dose Seasonal PF 05/30/2016, 04/28/2017, 06/04/2018, 05/01/2019, 05/07/2021, 04/30/2022, 03/04/2023   Influenza, Quadrivalent, Recombinant, Inj, Pf 04/19/2018   Influenza,inj,quad, With Preservative 06/03/2015   Influenza-Unspecified 06/04/2018, 05/01/2019, 04/23/2020   Moderna Covid-19 Fall Seasonal Vaccine 4yrs & older 06/18/2022   Moderna SARS-COV2 Booster Vaccination 01/23/2021   Moderna Sars-Covid-2 Vaccination 08/15/2019, 09/13/2019, 05/15/2020   PFIZER Comirnaty(Gray Top)Covid-19 Tri-Sucrose Vaccine  03/04/2023   Pfizer Covid-19 Vaccine Bivalent Booster 80yrs & up 04/16/2021, 11/05/2021   Pneumococcal Conjugate-13 04/28/2017   Pneumococcal Polysaccharide-23 06/20/2018   Tdap 06/21/2011, 07/08/2022   Zoster Recombinant(Shingrix) 03/22/2012   Zoster, Live 03/22/2012   Pertinent  Health Maintenance Due  Topic Date Due   Colonoscopy  11/25/2023   INFLUENZA VACCINE  02/02/2024   DEXA SCAN  10/16/2024   MAMMOGRAM  08/24/2025      04/28/2023   11:06 AM 06/19/2023    1:28 PM 07/13/2023   10:37 AM 08/08/2023   10:08 AM 08/21/2023    1:23 PM  Fall Risk  Falls in the  past year? 0 0 0 0 0  Was there an injury with Fall? 0 0 0 0 0  Fall Risk Category Calculator 0 0 0 0 0  Patient at Risk for Falls Due to No Fall Risks No Fall Risks No Fall Risks No Fall Risks No Fall Risks  Fall risk Follow up Falls evaluation completed Falls evaluation completed Falls evaluation completed Falls evaluation completed Falls evaluation completed   Functional Status Survey:    Vitals:   12/05/23 1421  BP: 120/68  Pulse: 69  Temp: 97.6 F (36.4 C)  SpO2: 98%  Weight: 138 lb 9.6 oz (62.9 kg)  Height: 5\' 6"  (1.676 m)   Body mass index is 22.37 kg/m. Physical Exam  VITALS: T- 97.6, P- 69, BP- 120/68, SaO2- 98% MEASUREMENTS: Weight- 138. GENERAL: Alert, cooperative, well developed, no acute distress HEENT: Normocephalic, normal oropharynx, moist mucous membranes, ears normal bilaterally, nose normal, throat normal, no sinus tenderness CHEST: Lungs with secretions, no wheezes, rhonchi, or crackles CARDIOVASCULAR: Normal heart rate and rhythm, S1 and S2 normal without murmurs ABDOMEN: Soft, non-tender, non-distended, without organomegaly, normal bowel sounds EXTREMITIES: No cyanosis or edema NEUROLOGICAL: Cranial nerves grossly intact, moves all extremities without gross motor or sensory deficit      Labs reviewed: Recent Labs    04/21/23 1201 07/25/23 1028 10/24/23 1030  NA 136 139 140  K 4.9 4.7 4.5  CL 100 103 104  CO2 27 28 29   GLUCOSE 100* 79 87  BUN 17 16 18   CREATININE 1.13* 1.05* 1.17*  CALCIUM 9.7 9.3 9.4   Recent Labs    04/21/23 1201 07/25/23 1028 10/24/23 1030  AST 22 18 19   ALT 12 11 12   ALKPHOS 58 54 60  BILITOT 0.3 0.3 0.3  PROT 6.8 6.5 6.5  ALBUMIN 4.1 4.0 4.1   Recent Labs    04/21/23 1201 07/25/23 1028 10/24/23 1030  WBC 5.8 6.0 5.0  NEUTROABS 3.5 3.5 2.8  HGB 13.1 12.4 12.7  HCT 39.1 37.3 37.2  MCV 96.5 97.4 96.6  PLT 89* 178 113*   No results found for: "TSH" Lab Results  Component Value Date   HGBA1C 5.4  09/03/2018   Lab Results  Component Value Date   CHOL 178 06/21/2021   HDL 62 06/21/2021   LDLCALC 98 06/21/2021   TRIG 90 06/21/2021   CHOLHDL 2.9 06/21/2021    Significant Diagnostic Results in last 30 days:  No results found.  Assessment/Plan    Persistent Cough Persistent cough following a cold contracted on May 20th while traveling in [place]. The cough has settled in the chest with violent coughing spells, but no chest pain, chest tightness, wheezing, fever, or shortness of breath. The cough has plateaued despite self-medication with prednisone  and guaifenesin . Oxygen saturation is 98%, and vital signs are stable. The cough is likely due to  residual respiratory secretions. Given the duration of symptoms and plateau in improvement, an antibiotic is warranted to address potential bacterial involvement. - Continue guaifenesin  to help break down secretions. - Prescribe doxycycline  to be taken twice daily to address potential bacterial involvement. - Use an incentive spirometer to exercise the lungs. - Monitor for any wheezing or shortness of breath and seek medical attention if these occur. - Take loratadine if experiencing rhinorrhea.   Family/ staff Communication: Reviewed plan of care with patient verbalized understanding   Labs/tests ordered:  - POC COVID-19  - POC Influenza A/B   Next Appointment: Return if symptoms worsen or fail to improve.   Total time: 20 minutes. Greater than 50% of total time spent doing patient education regarding persistent cough ,health maintenance including symptom/medication management.   Estil Heman, NP

## 2023-12-06 DIAGNOSIS — L570 Actinic keratosis: Secondary | ICD-10-CM | POA: Diagnosis not present

## 2023-12-06 DIAGNOSIS — D2271 Melanocytic nevi of right lower limb, including hip: Secondary | ICD-10-CM | POA: Diagnosis not present

## 2023-12-06 DIAGNOSIS — Z85828 Personal history of other malignant neoplasm of skin: Secondary | ICD-10-CM | POA: Diagnosis not present

## 2023-12-06 DIAGNOSIS — D225 Melanocytic nevi of trunk: Secondary | ICD-10-CM | POA: Diagnosis not present

## 2023-12-06 DIAGNOSIS — L578 Other skin changes due to chronic exposure to nonionizing radiation: Secondary | ICD-10-CM | POA: Diagnosis not present

## 2023-12-06 DIAGNOSIS — L821 Other seborrheic keratosis: Secondary | ICD-10-CM | POA: Diagnosis not present

## 2023-12-06 DIAGNOSIS — D2372 Other benign neoplasm of skin of left lower limb, including hip: Secondary | ICD-10-CM | POA: Diagnosis not present

## 2023-12-22 ENCOUNTER — Ambulatory Visit (INDEPENDENT_AMBULATORY_CARE_PROVIDER_SITE_OTHER): Payer: Medicare Other | Admitting: Adult Health

## 2023-12-22 ENCOUNTER — Encounter: Payer: Self-pay | Admitting: Adult Health

## 2023-12-22 VITALS — BP 112/58 | HR 68 | Temp 97.7°F | Ht 66.0 in | Wt 137.2 lb

## 2023-12-22 DIAGNOSIS — N1831 Chronic kidney disease, stage 3a: Secondary | ICD-10-CM | POA: Diagnosis not present

## 2023-12-22 DIAGNOSIS — M810 Age-related osteoporosis without current pathological fracture: Secondary | ICD-10-CM | POA: Diagnosis not present

## 2023-12-22 DIAGNOSIS — C50919 Malignant neoplasm of unspecified site of unspecified female breast: Secondary | ICD-10-CM

## 2023-12-22 DIAGNOSIS — M1712 Unilateral primary osteoarthritis, left knee: Secondary | ICD-10-CM | POA: Diagnosis not present

## 2023-12-22 DIAGNOSIS — J00 Acute nasopharyngitis [common cold]: Secondary | ICD-10-CM

## 2023-12-22 MED ORDER — DICLOFENAC SODIUM 1 % EX GEL
2.0000 g | Freq: Four times a day (QID) | CUTANEOUS | Status: AC | PRN
Start: 2023-12-22 — End: ?

## 2023-12-22 NOTE — Progress Notes (Unsigned)
 [facility] clinic  Provider:  Inge Mangle DNP  Code Status:  Full Code  Goals of Care:     10/24/2023   10:56 AM  Advanced Directives  Does Patient Have a Medical Advance Directive? Yes  Type of Advance Directive Living will  Copy of Healthcare Power of Attorney in Chart? Yes - validated most recent copy scanned in chart (See row information)  Would patient like information on creating a medical advance directive? No - Patient declined     Chief Complaint  Patient presents with   Medical Management of Chronic Issues    6 month follow up  Patient still has congestion from last visit 12/05/23    Discussed the use of AI scribe software for clinical note transcription with the patient, who gave verbal consent to proceed.  HPI: Patient is a 73 y.o. 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, right (HCC) 08/30/2018   Complication of anesthesia    very sensitive to meds   Goals of care, counseling/discussion 08/30/2018   History of bone density study 09/29/2020   History of colonoscopy 11/24/2020   History of EKG 10/10/2018   History of mammogram 01/17/2020   History of MRI    Breast (03/12/2020), Brain (06/13/2019) and Knee (02/23/2018& 05/22/2018)   Osteoporosis    Personal history of chemotherapy    Shingles    Skin cancer    basal cell x 2 (on back and on nose)   Tinnitus of left ear     Past Surgical History:  Procedure Laterality Date   bone cyst removed Right 1978   BREAST BIOPSY Left 08/28/2018   CATARACT EXTRACTION Bilateral    COLON RESECTION Right 09/14/2018   Procedure: LAPAROSCOPIC  ASSISTED RIGHT COLECTOMY;  Surgeon: Boyce Byes, MD;  Location: [facility] OR;  Service: General;  Laterality: Right;   COLONOSCOPY     LAPAROSCOPIC RIGHT COLECTOMY  09/14/2018    Allergies  Allergen Reactions   Kisqali  (200 Mg Dose) [Ribociclib  Succ (200 Mg Dose)] Itching and Rash     scratchy throat.    Outpatient Encounter Medications as of 12/22/2023  Medication Sig   abemaciclib  (VERZENIO ) 100 MG tablet Take 1 tablet (100 mg total) by mouth daily.   cetirizine  (ZYRTEC ) 10 MG tablet Take 1 tablet (10 mg total) by mouth daily.   Cholecalciferol (VITAMIN D3 PO) Take 2,000 Units by mouth daily.   denosumab  (PROLIA ) 60 MG/ML SOSY injection Inject 60 mg into the skin every 6 (six) months.   diclofenac  Sodium (VOLTAREN ) 1 % GEL Apply 2 g topically 4 (four) times daily.   fluticasone  (FLONASE ) 50 MCG/ACT nasal spray Place 2 sprays into both nostrils daily.   letrozole  (FEMARA ) 2.5 MG tablet Take 1 tablet (2.5 mg total) by mouth daily.   loperamide (IMODIUM) 2 MG capsule Take 2 mg by mouth as needed.   Psyllium (METAMUCIL) 48.57 % POWD Take 1 packet by mouth daily.   VEVYE 0.1 % SOLN Place 1 drop into both eyes daily at 6 (six) AM.   No facility-administered encounter medications on file as of 12/22/2023.    Review of Systems:  Review of Systems  Constitutional:  Negative for appetite change, chills, fatigue and fever.  HENT:  Negative for congestion, hearing loss, rhinorrhea and sore throat.   Eyes: Negative.   Respiratory:  Negative for cough, shortness of breath and wheezing.   Cardiovascular:  Negative for chest pain, palpitations and leg swelling.  Gastrointestinal:  Negative for abdominal pain, constipation, diarrhea, nausea and vomiting.  Genitourinary:  Negative for dysuria.  Musculoskeletal:  Negative for arthralgias, back pain and myalgias.  Skin:  Negative for color change, rash and wound.  Neurological:  Negative for dizziness, weakness and headaches.  Psychiatric/Behavioral:  Negative for behavioral problems. The patient is not nervous/anxious.     Health Maintenance  Topic Date Due   Zoster Vaccines- Shingrix (2 of 2) 05/17/2012   COVID-19 Vaccine (8 - 2024-25 season) 04/29/2023   Colonoscopy  11/25/2023   INFLUENZA VACCINE  02/02/2024   Medicare  Annual Wellness (AWV)  04/10/2024   DEXA SCAN  10/16/2024   MAMMOGRAM  08/24/2025   DTaP/Tdap/Td (3 - Td or Tdap) 07/08/2032   Pneumococcal Vaccine: 50+ Years  Completed   Hepatitis C Screening  Completed   HPV VACCINES  Aged Out   Meningococcal B Vaccine  Aged Out    Physical Exam: Vitals:   12/22/23 1024  BP: (!) 112/58  Pulse: 68  Temp: 97.7 F (36.5 C)  SpO2: 98%  Weight: 137 lb 3.2 oz (62.2 kg)  Height: 5' 6 (1.676 m)   Body mass index is 22.14 kg/m. Physical Exam Constitutional:      Appearance: Normal appearance.  HENT:     Head: Normocephalic and atraumatic.     Nose: Nose normal.     Mouth/Throat:     Mouth: Mucous membranes are moist.   Eyes:     Conjunctiva/sclera: Conjunctivae normal.    Cardiovascular:     Rate and Rhythm: Normal rate and regular rhythm.  Pulmonary:     Effort: Pulmonary effort is normal.     Breath sounds: Normal breath sounds.  Abdominal:     General: Bowel sounds are normal.     Palpations: Abdomen is soft.   Musculoskeletal:        General: Normal range of motion.     Cervical back: Normal range of motion.   Skin:    General: Skin is warm and dry.   Neurological:     General: No focal deficit present.     Mental Status: She is alert and oriented to person, place, and time.   Psychiatric:        Mood and Affect: Mood normal.        Behavior: Behavior normal.        Thought Content: Thought content normal.        Judgment: Judgment normal.     Labs reviewed: Basic Metabolic Panel: Recent Labs    04/21/23 1201 07/25/23 1028 10/24/23 1030  NA 136 139 140  K 4.9 4.7 4.5  CL 100 103 104  CO2 27 28 29   GLUCOSE 100* 79 87  BUN 17 16 18   CREATININE 1.13* 1.05* 1.17*  CALCIUM 9.7 9.3 9.4   Liver Function Tests: Recent Labs    04/21/23 1201 07/25/23 1028 10/24/23 1030  AST 22 18 19   ALT 12 11 12   ALKPHOS 58 54 60  BILITOT 0.3 0.3 0.3  PROT 6.8 6.5 6.5  ALBUMIN 4.1 4.0 4.1   No results for input(s):  LIPASE, AMYLASE in the last 8760 hours. No results for input(s): AMMONIA in the last 8760 hours. CBC: Recent Labs    04/21/23 1201 07/25/23 1028 10/24/23 1030  WBC 5.8 6.0 5.0  NEUTROABS 3.5 3.5 2.8  HGB 13.1 12.4 12.7  HCT 39.1 37.3 37.2  MCV 96.5 97.4 96.6  PLT 89* 178 113*   Lipid Panel: No results for input(s): CHOL, HDL, LDLCALC, TRIG, CHOLHDL, LDLDIRECT in the last 8760 hours. Lab Results  Component Value Date   HGBA1C 5.4 09/03/2018    Procedures since last visit: No results found.  Assessment/Plan Assessment and Plan Assessment & Plan     ***   Labs/tests ordered:  * No order type specified *   No follow-ups on file.  Barrett Holthaus Medina-Vargas, NP

## 2023-12-25 ENCOUNTER — Other Ambulatory Visit: Payer: Self-pay

## 2023-12-25 NOTE — Progress Notes (Signed)
 Specialty Pharmacy Ongoing Clinical Assessment Note  Martha Osborne is a 73 y.o. female who is being followed by the specialty pharmacy service for RxSp Oncology   Patient's specialty medication(s) reviewed today: Abemaciclib  (VERZENIO )   Missed doses in the last 4 weeks: 0   Patient/Caregiver did not have any additional questions or concerns.   Therapeutic benefit summary: Patient is achieving benefit   Adverse events/side effects summary: No adverse events/side effects   Patient's therapy is appropriate to: Continue    Goals Addressed             This Visit's Progress    Stabilization of disease   On track    Patient is on track. Patient will maintain adherence. Per 10/24/23 provider note patient disease is stable. Last PET scan was in January. CA 27.29 was 29.          Follow up: 6 months  Leobardo Granlund M Rebbie Lauricella Specialty Pharmacist

## 2023-12-25 NOTE — Progress Notes (Signed)
 Specialty Pharmacy Refill Coordination Note  Martha Osborne is a 73 y.o. female contacted today regarding refills of specialty medication(s) Abemaciclib  (VERZENIO )   Patient requested [name] at [facility] at [place] date: 01/01/24   Medication will be filled on 12/29/23.

## 2023-12-27 DIAGNOSIS — Z961 Presence of intraocular lens: Secondary | ICD-10-CM | POA: Diagnosis not present

## 2023-12-27 DIAGNOSIS — H04123 Dry eye syndrome of bilateral lacrimal glands: Secondary | ICD-10-CM | POA: Diagnosis not present

## 2023-12-27 DIAGNOSIS — H26492 Other secondary cataract, left eye: Secondary | ICD-10-CM | POA: Diagnosis not present

## 2023-12-27 DIAGNOSIS — H18413 Arcus senilis, bilateral: Secondary | ICD-10-CM | POA: Diagnosis not present

## 2023-12-28 ENCOUNTER — Other Ambulatory Visit: Payer: Self-pay

## 2024-01-01 ENCOUNTER — Other Ambulatory Visit (HOSPITAL_COMMUNITY): Payer: Self-pay

## 2024-01-19 ENCOUNTER — Other Ambulatory Visit: Payer: Self-pay

## 2024-01-21 ENCOUNTER — Encounter (INDEPENDENT_AMBULATORY_CARE_PROVIDER_SITE_OTHER): Payer: Self-pay

## 2024-01-22 ENCOUNTER — Other Ambulatory Visit: Payer: Self-pay

## 2024-01-22 NOTE — Progress Notes (Signed)
 Specialty Pharmacy Refill Coordination Note  Martha Osborne is a 73 y.o. female contacted today regarding refills of specialty medication(s) Abemaciclib  (VERZENIO )   Patient requested (Patient-Rptd) Pickup at [facility] at [facility] date: (Patient-Rptd) 01/24/24   Medication will be filled on 07.22.25.

## 2024-01-23 ENCOUNTER — Other Ambulatory Visit: Payer: Self-pay

## 2024-01-30 DIAGNOSIS — Z860101 Personal history of adenomatous and serrated colon polyps: Secondary | ICD-10-CM | POA: Diagnosis not present

## 2024-01-30 DIAGNOSIS — K648 Other hemorrhoids: Secondary | ICD-10-CM | POA: Diagnosis not present

## 2024-01-30 DIAGNOSIS — Z09 Encounter for follow-up examination after completed treatment for conditions other than malignant neoplasm: Secondary | ICD-10-CM | POA: Diagnosis not present

## 2024-01-30 LAB — HM COLONOSCOPY

## 2024-02-05 ENCOUNTER — Ambulatory Visit (HOSPITAL_COMMUNITY): Admission: RE | Admit: 2024-02-05 | Source: Ambulatory Visit

## 2024-02-06 ENCOUNTER — Encounter (HOSPITAL_COMMUNITY)
Admission: RE | Admit: 2024-02-06 | Discharge: 2024-02-06 | Disposition: A | Discharge status: Home / Self Care | Source: Ambulatory Visit | Attending: Hematology & Oncology | Admitting: Hematology & Oncology

## 2024-02-06 ENCOUNTER — Inpatient Hospital Stay (HOSPITAL_COMMUNITY): Admission: RE | Admit: 2024-02-06 | Source: Ambulatory Visit

## 2024-02-06 DIAGNOSIS — C50911 Malignant neoplasm of unspecified site of right female breast: Secondary | ICD-10-CM | POA: Insufficient documentation

## 2024-02-06 DIAGNOSIS — C785 Secondary malignant neoplasm of large intestine and rectum: Secondary | ICD-10-CM | POA: Diagnosis not present

## 2024-02-06 DIAGNOSIS — K5730 Diverticulosis of large intestine without perforation or abscess without bleeding: Secondary | ICD-10-CM | POA: Diagnosis not present

## 2024-02-06 DIAGNOSIS — I700 Atherosclerosis of aorta: Secondary | ICD-10-CM | POA: Diagnosis not present

## 2024-02-06 LAB — GLUCOSE, CAPILLARY: Glucose-Capillary: 85 mg/dL (ref 70–99)

## 2024-02-06 MED ORDER — FLUDEOXYGLUCOSE F - 18 (FDG) INJECTION
6.8200 | Freq: Once | INTRAVENOUS | Status: AC
  Administered 2024-02-06: 6.82 via INTRAVENOUS

## 2024-02-14 ENCOUNTER — Other Ambulatory Visit: Payer: Self-pay

## 2024-02-15 ENCOUNTER — Other Ambulatory Visit: Payer: Self-pay

## 2024-02-16 ENCOUNTER — Ambulatory Visit: Payer: Self-pay | Admitting: Hematology & Oncology

## 2024-02-16 ENCOUNTER — Other Ambulatory Visit: Payer: Self-pay

## 2024-02-16 ENCOUNTER — Other Ambulatory Visit: Payer: Self-pay | Admitting: Hematology & Oncology

## 2024-02-16 ENCOUNTER — Other Ambulatory Visit (HOSPITAL_COMMUNITY): Payer: Self-pay

## 2024-02-16 DIAGNOSIS — C50911 Malignant neoplasm of unspecified site of right female breast: Secondary | ICD-10-CM

## 2024-02-16 MED ORDER — LETROZOLE 2.5 MG PO TABS
2.5000 mg | ORAL_TABLET | Freq: Every day | ORAL | 6 refills | Status: AC
  Filled 2024-02-16: qty 90, 90d supply, fill #0
  Filled 2024-05-09: qty 90, 90d supply, fill #1
  Filled 2024-08-09: qty 90, 90d supply, fill #2

## 2024-02-16 NOTE — Progress Notes (Signed)
 Specialty Pharmacy Refill Coordination Note  Martha Osborne is a 73 y.o. female contacted today regarding refills of specialty medication(s) Abemaciclib  (VERZENIO )   Patient requested [name] at [facility] at [address] date: 02/22/24   Medication will be filled on 02/21/24.

## 2024-02-21 ENCOUNTER — Other Ambulatory Visit: Payer: Self-pay

## 2024-02-26 ENCOUNTER — Other Ambulatory Visit: Payer: Self-pay | Admitting: *Deleted

## 2024-02-26 DIAGNOSIS — M810 Age-related osteoporosis without current pathological fracture: Secondary | ICD-10-CM

## 2024-02-26 DIAGNOSIS — C50911 Malignant neoplasm of unspecified site of right female breast: Secondary | ICD-10-CM

## 2024-02-27 ENCOUNTER — Inpatient Hospital Stay (HOSPITAL_BASED_OUTPATIENT_CLINIC_OR_DEPARTMENT_OTHER): Admitting: Hematology & Oncology

## 2024-02-27 ENCOUNTER — Inpatient Hospital Stay: Attending: Hematology & Oncology

## 2024-02-27 ENCOUNTER — Other Ambulatory Visit: Payer: Self-pay

## 2024-02-27 ENCOUNTER — Encounter: Payer: Self-pay | Admitting: Hematology & Oncology

## 2024-02-27 VITALS — BP 125/46 | HR 71 | Temp 98.4°F | Resp 16 | Ht 66.0 in | Wt 132.0 lb

## 2024-02-27 DIAGNOSIS — Z1732 Human epidermal growth factor receptor 2 negative status: Secondary | ICD-10-CM | POA: Insufficient documentation

## 2024-02-27 DIAGNOSIS — Z170 Estrogen receptor positive status [ER+]: Secondary | ICD-10-CM | POA: Diagnosis not present

## 2024-02-27 DIAGNOSIS — M810 Age-related osteoporosis without current pathological fracture: Secondary | ICD-10-CM | POA: Diagnosis not present

## 2024-02-27 DIAGNOSIS — C785 Secondary malignant neoplasm of large intestine and rectum: Secondary | ICD-10-CM | POA: Diagnosis not present

## 2024-02-27 DIAGNOSIS — Z79811 Long term (current) use of aromatase inhibitors: Secondary | ICD-10-CM | POA: Diagnosis not present

## 2024-02-27 DIAGNOSIS — C50911 Malignant neoplasm of unspecified site of right female breast: Secondary | ICD-10-CM

## 2024-02-27 DIAGNOSIS — Z1721 Progesterone receptor positive status: Secondary | ICD-10-CM | POA: Diagnosis not present

## 2024-02-27 DIAGNOSIS — C50912 Malignant neoplasm of unspecified site of left female breast: Secondary | ICD-10-CM | POA: Insufficient documentation

## 2024-02-27 DIAGNOSIS — Z9049 Acquired absence of other specified parts of digestive tract: Secondary | ICD-10-CM | POA: Diagnosis not present

## 2024-02-27 LAB — CMP (CANCER CENTER ONLY)
ALT: 21 U/L (ref 0–44)
AST: 27 U/L (ref 15–41)
Albumin: 4.2 g/dL (ref 3.5–5.0)
Alkaline Phosphatase: 74 U/L (ref 38–126)
Anion gap: 11 (ref 5–15)
BUN: 18 mg/dL (ref 8–23)
CO2: 25 mmol/L (ref 22–32)
Calcium: 10 mg/dL (ref 8.9–10.3)
Chloride: 103 mmol/L (ref 98–111)
Creatinine: 1.15 mg/dL — ABNORMAL HIGH (ref 0.44–1.00)
GFR, Estimated: 50 mL/min — ABNORMAL LOW
Glucose, Bld: 113 mg/dL — ABNORMAL HIGH (ref 70–99)
Potassium: 5.1 mmol/L (ref 3.5–5.1)
Sodium: 139 mmol/L (ref 135–145)
Total Bilirubin: 0.3 mg/dL (ref 0.0–1.2)
Total Protein: 6.9 g/dL (ref 6.5–8.1)

## 2024-02-27 LAB — CBC WITH DIFFERENTIAL (CANCER CENTER ONLY)
Abs Immature Granulocytes: 0.02 10*3/uL (ref 0.00–0.07)
Basophils Absolute: 0 10*3/uL (ref 0.0–0.1)
Basophils Relative: 1 %
Eosinophils Absolute: 0.1 10*3/uL (ref 0.0–0.5)
Eosinophils Relative: 1 %
HCT: 38.4 % (ref 36.0–46.0)
Hemoglobin: 12.8 g/dL (ref 12.0–15.0)
Immature Granulocytes: 0 %
Lymphocytes Relative: 31 %
Lymphs Abs: 1.6 10*3/uL (ref 0.7–4.0)
MCH: 32 pg (ref 26.0–34.0)
MCHC: 33.3 g/dL (ref 30.0–36.0)
MCV: 96 fL (ref 80.0–100.0)
Monocytes Absolute: 0.4 10*3/uL (ref 0.1–1.0)
Monocytes Relative: 7 %
Neutro Abs: 3.1 10*3/uL (ref 1.7–7.7)
Neutrophils Relative %: 60 %
Platelet Count: 98 10*3/uL — ABNORMAL LOW (ref 150–400)
RBC: 4 MIL/uL (ref 3.87–5.11)
RDW: 13.2 % (ref 11.5–15.5)
WBC Count: 5.2 10*3/uL (ref 4.0–10.5)
nRBC: 0 % (ref 0.0–0.2)

## 2024-02-27 LAB — LACTATE DEHYDROGENASE: LDH: 112 U/L (ref 98–192)

## 2024-02-27 NOTE — Progress Notes (Signed)
 Hematology and Oncology Follow Up Visit  Martha Osborne [id number] Jul 24, 1950 73 y.o. 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 daily Ribociclib  600 mg p.o. daily (21/7) -- d/c due to skin rash Prolia  60 mg IM q. 6 months -- next dose on 04/2024       Verzenio  100 mg po day - started on 08/12/2018-changed         10/2021                    Interim History:  Martha Osborne is back for follow-up.  We last saw her back in April.  At that time, everything was going well for her.  She has been incredibly busy since then.  She and her husband went on a [place] cruise.  They got back.  And they went out to [place].  After that, they went down to [place].  She just got back yesterday..  She is somewhat tired.  I totally understand that.  We did do a PET scan on her.  The PET scan was done on 02/06/2024.  Thankfully, the PET scan did not show any evidence of recurrent malignancy.  Her last CA 27.29 is really holding steady at 32.  She has had no problems with the Verzenio .  She is only doing 100 mg a day which she seems to be tolerating.  She has had a good appetite.  She has had no nausea or vomiting.  There is been no change in bowel or bladder habits.  Overall, I would have to say that her performance status is probably ECOG 1.   Medications:  Current Outpatient Medications:    abemaciclib  (VERZENIO ) 100 MG tablet, Take 1 tablet (100 mg total) by mouth daily., Disp: 30 tablet, Rfl: 6   cetirizine  (ZYRTEC ) 10 MG tablet, Take 1 tablet (10 mg total) by mouth daily., Disp: 30 tablet, Rfl: 11   Cholecalciferol (VITAMIN D3 PO), Take 2,000 Units by mouth daily., Disp: , Rfl:    denosumab  (PROLIA ) 60 MG/ML SOSY injection, Inject 60 mg into the skin every 6 (six) months., Disp: , Rfl:    diclofenac  Sodium (VOLTAREN ) 1 %  GEL, Apply 2 g topically 4 (four) times daily as needed., Disp: , Rfl:    fluticasone  (FLONASE ) 50 MCG/ACT nasal spray, Place 2 sprays into both nostrils daily., Disp: 16 g, Rfl: 6   letrozole  (FEMARA ) 2.5 MG tablet, Take 1 tablet (2.5 mg total) by mouth daily., Disp: 90 tablet, Rfl: 6   loperamide (IMODIUM) 2 MG capsule, Take 2 mg by mouth as needed., Disp: , Rfl:    Psyllium (METAMUCIL) 48.57 % POWD, Take 1 packet by mouth daily., Disp: , Rfl:    VEVYE 0.1 % SOLN, Place 1 drop into both eyes daily at 6 (six) AM., Disp: , Rfl:   Allergies:  Allergies  Allergen Reactions   Kisqali  (200 Mg Dose) [Ribociclib  Succ (200 Mg Dose)] Itching and Rash    scratchy throat.    Past Medical History, Surgical history, Social history, and Family History were reviewed and updated.  Review of Systems: Review of Systems  Constitutional: Negative.   HENT:  Negative.   Eyes: Negative.   Respiratory: Negative.   Cardiovascular: Negative.   Gastrointestinal: Positive for abdominal pain.  Endocrine: Negative.   Genitourinary: Negative.    Musculoskeletal: Negative.  Skin: Negative.   Neurological: Negative.   Hematological: Negative.   Psychiatric/Behavioral: Negative.     Physical Exam:  height is 5' 6 (1.676 m) and weight is 132 lb (59.9 kg). Her oral temperature is 98.4 F (36.9 C). Her blood pressure is 125/46 (abnormal) and her pulse is 71. Her respiration is 16 and oxygen saturation is 100%.   Wt Readings from Last 3 Encounters:  02/27/24 132 lb (59.9 kg)  12/22/23 137 lb 3.2 oz (62.2 kg)  12/05/23 138 lb 9.6 oz (62.9 kg)    Physical Exam Vitals signs reviewed.  Constitutional:      Comments: Breast exam bilaterally shows no breast masses.  She has no nipple discharge bilaterally.  She has no breast swelling or erythema.  There is no bilateral axillary adenopathy.  HENT:     Head: Normocephalic and atraumatic.  Eyes:     Pupils: Pupils are equal, round, and reactive to light.   Neck:     Musculoskeletal: Normal range of motion.  Cardiovascular:     Rate and Rhythm: Normal rate and regular rhythm.     Heart sounds: Normal heart sounds.  Pulmonary:     Effort: Pulmonary effort is normal.     Breath sounds: Normal breath sounds.  Abdominal:     General: Bowel sounds are normal.     Palpations: Abdomen is soft.     Comments: Abdominal exam shows the healing laparotomy scar.  She has no swelling.  There is no erythema about the surgical site.  She has been tenderness to palpation in the right lower quadrant.  No masses noted.  Bowel sounds are present.  There is no palpable liver or spleen tip.  Musculoskeletal: Normal range of motion.        General: No tenderness or deformity.  Lymphadenopathy:     Cervical: No cervical adenopathy.  Skin:    General: Skin is warm and dry.     Findings: No erythema or rash.  Neurological:     Mental Status: She is alert and oriented to person, place, and time.  Psychiatric:        Behavior: Behavior normal.        Thought Content: Thought content normal.        Judgment: Judgment normal.      Lab Results  Component Value Date   WBC 5.2 02/27/2024   HGB 12.8 02/27/2024   HCT 38.4 02/27/2024   MCV 96.0 02/27/2024   PLT 98 (L) 02/27/2024     Chemistry      Component Value Date/Time   NA 140 10/24/2023 1030   K 4.5 10/24/2023 1030   CL 104 10/24/2023 1030   CO2 29 10/24/2023 1030   BUN 18 10/24/2023 1030   CREATININE 1.17 (H) 10/24/2023 1030      Component Value Date/Time   CALCIUM 9.4 10/24/2023 1030   ALKPHOS 60 10/24/2023 1030   AST 19 10/24/2023 1030   ALT 12 10/24/2023 1030   BILITOT 0.3 10/24/2023 1030       Impression and Plan: Martha Osborne is a 73 year old postmenopausal female.  She has metastatic lobular carcinoma of the left breast.  We finally found her primary after doing a breast MRI.  She had resection of the colonic mass.  She had multiple positive lymph nodes.   Again, I do not see any  evidence that the breast cancer is progressing.    She will come back in October for a earlier shot.  I  think that I can probably see her back after the Holiday season.  I really do not think that we have to do another scan on her until next year.   Jeralyn Crease, MD

## 2024-02-28 ENCOUNTER — Ambulatory Visit: Payer: Self-pay | Admitting: Hematology & Oncology

## 2024-02-28 LAB — CANCER ANTIGEN 27.29: CA 27.29: 31.9 U/mL (ref 0.0–38.6)

## 2024-02-28 NOTE — Telephone Encounter (Signed)
 Advised via MyChart.

## 2024-02-28 NOTE — Telephone Encounter (Signed)
-----   Message from Maude JONELLE Crease sent at 02/28/2024  1:57 PM EDT ----- Please call and let her know that the tumor marker in the blood is stable at 32.  Thanks.  Jeralyn ----- Message ----- From: Rebecka, Lab [facility] [place] Sent: 02/27/2024  11:57 AM EDT To: Maude JONELLE Crease, MD

## 2024-03-13 ENCOUNTER — Other Ambulatory Visit (HOSPITAL_COMMUNITY): Payer: Self-pay

## 2024-03-19 ENCOUNTER — Other Ambulatory Visit (HOSPITAL_COMMUNITY): Payer: Self-pay

## 2024-03-19 ENCOUNTER — Other Ambulatory Visit: Payer: Self-pay

## 2024-03-19 ENCOUNTER — Other Ambulatory Visit: Payer: Self-pay | Admitting: Pharmacy Technician

## 2024-03-19 ENCOUNTER — Encounter (INDEPENDENT_AMBULATORY_CARE_PROVIDER_SITE_OTHER): Payer: Self-pay

## 2024-03-19 NOTE — Progress Notes (Signed)
 Specialty Pharmacy Refill Coordination Note  Martha Osborne is a 73 y.o. female contacted today regarding refills of specialty medication(s) Abemaciclib  (VERZENIO )   Patient requested [name] at [facility] at [address]   Medication will be filled on 03/22/24. Patient pickup on 03/25/24.  Questionnaire answered.

## 2024-03-20 ENCOUNTER — Other Ambulatory Visit: Payer: Self-pay | Admitting: General Surgery

## 2024-03-20 DIAGNOSIS — C50911 Malignant neoplasm of unspecified site of right female breast: Secondary | ICD-10-CM

## 2024-03-22 ENCOUNTER — Other Ambulatory Visit: Payer: Self-pay

## 2024-04-15 ENCOUNTER — Encounter: Payer: Medicare Other | Admitting: Adult Health

## 2024-04-15 ENCOUNTER — Encounter (INDEPENDENT_AMBULATORY_CARE_PROVIDER_SITE_OTHER): Payer: Self-pay

## 2024-04-16 ENCOUNTER — Other Ambulatory Visit (HOSPITAL_COMMUNITY): Payer: Self-pay

## 2024-04-16 ENCOUNTER — Encounter: Payer: Medicare Other | Admitting: Nurse Practitioner

## 2024-04-16 NOTE — Progress Notes (Signed)
 Specialty Pharmacy Refill Coordination Note  Martha Osborne is a 73 y.o. female contacted today regarding refills of specialty medication(s) Abemaciclib  (VERZENIO )   Patient requested [name] at [facility] at [place] date: 04/22/24   Medication will be filled on 04/19/24.

## 2024-04-19 ENCOUNTER — Encounter: Payer: Self-pay | Admitting: Adult Health

## 2024-04-19 ENCOUNTER — Ambulatory Visit (INDEPENDENT_AMBULATORY_CARE_PROVIDER_SITE_OTHER): Admitting: Adult Health

## 2024-04-19 ENCOUNTER — Other Ambulatory Visit: Payer: Self-pay

## 2024-04-19 VITALS — BP 122/66 | HR 65 | Temp 97.8°F | Resp 18 | Ht 66.0 in | Wt 135.0 lb

## 2024-04-19 DIAGNOSIS — Z0000 Encounter for general adult medical examination without abnormal findings: Secondary | ICD-10-CM

## 2024-04-19 NOTE — Patient Instructions (Addendum)
 1.Report to local pharmacy to receive Covid vaccine.   Martha Osborne , Thank you for taking time to come for your Medicare Wellness Visit. I appreciate your ongoing commitment to your health goals. Please review the following plan we discussed and let me know if I can assist you in the future.   These are the goals we discussed:  Goals      Exercise 3x per week (30 min per time)     - plays golf, lift weight 6.6 lbs,      Stabilization of disease     Patient is on track. Patient will maintain adherence. Per 10/24/23 provider note patient disease is stable. Last PET scan was in January. CA 27.29 was 29.          This is a list of the screening recommended for you and due dates:  Health Maintenance  Topic Date Due   Flu Shot  05/21/2024*   COVID-19 Vaccine (9 - 2025-26 season) 06/19/2024*   Breast Cancer Screening  08/24/2024   DEXA scan (bone density measurement)  10/16/2024   Medicare Annual Wellness Visit  04/19/2025   Colon Cancer Screening  01/30/2027   DTaP/Tdap/Td vaccine (3 - Td or Tdap) 07/08/2032   Pneumococcal Vaccine for age over 73  Completed   Hepatitis C Screening  Completed   Zoster (Shingles) Vaccine  Completed   Meningitis B Vaccine  Aged Out  *Topic was postponed. The date shown is not the original due date.

## 2024-04-19 NOTE — Progress Notes (Signed)
 Subjective:   Martha Osborne is a 73 y.o. female who presents for Medicare Annual (Subsequent) preventive examination.  Visit Complete: In person  Patient Medicare AWV questionnaire was completed by the patient on 04/19/24; I have confirmed that all information answered by patient is correct and no changes since this date.  Cardiac Risk Factors include: advanced age (>37men, >98 women);family history of premature cardiovascular disease     Objective:    Today's Vitals   04/15/24 1416 04/19/24 1024  BP:  122/66  Pulse:  65  Resp:  18  Temp:  97.8 F (36.6 C)  SpO2:  98%  Weight:  135 lb (61.2 kg)  Height:  5' 6 (1.676 m)  PainSc: 3     Body mass index is 21.79 kg/m.     04/19/2024   10:16 AM 02/27/2024   11:56 AM 10/24/2023   10:56 AM 08/08/2023   10:09 AM 07/25/2023   10:38 AM 07/13/2023   10:38 AM 04/28/2023   11:05 AM  Advanced Directives  Does Patient Have a Medical Advance Directive? Yes Yes Yes Yes Yes Yes Yes  Type of Estate agent of [place];Out of facility DNR (pink MOST or yellow form) Living will;Healthcare Power of [address] Living will Healthcare Power of [place];Living will;Out of facility DNR (pink MOST or yellow form) Living will Healthcare Power of [place];Out of facility DNR (pink MOST or yellow form) Healthcare Power of Attorney  Does patient want to make changes to medical advance directive? No - Patient declined No - Patient declined  No - Patient declined No - Patient declined No - Patient declined No - Patient declined  Copy of Healthcare Power of Attorney in Chart? Yes - validated most recent copy scanned in chart (See row information)  Yes - validated most recent copy scanned in chart (See row information) Yes - validated most recent copy scanned in chart (See row information)   Yes - validated most recent copy scanned in chart (See row information)  Would patient like information on creating a medical advance directive?   No -  Patient declined      Pre-existing out of facility DNR order (yellow form or pink MOST form)      Pink MOST form placed in chart (order not valid for inpatient use)     Current Medications (verified) Outpatient Encounter Medications as of 04/19/2024  Medication Sig   abemaciclib  (VERZENIO ) 100 MG tablet Take 1 tablet (100 mg total) by mouth daily.   cetirizine  (ZYRTEC ) 10 MG tablet Take 1 tablet (10 mg total) by mouth daily. (Patient taking differently: Take 10 mg by mouth as needed.)   Cholecalciferol (VITAMIN D3 PO) Take 2,000 Units by mouth daily.   denosumab  (PROLIA ) 60 MG/ML SOSY injection Inject 60 mg into the skin every 6 (six) months.   diclofenac  Sodium (VOLTAREN ) 1 % GEL Apply 2 g topically 4 (four) times daily as needed.   letrozole  (FEMARA ) 2.5 MG tablet Take 1 tablet (2.5 mg total) by mouth daily.   loperamide (IMODIUM) 2 MG capsule Take 2 mg by mouth as needed.   Psyllium (METAMUCIL) 48.57 % POWD Take 1 packet by mouth daily.   VEVYE 0.1 % SOLN Place 1 drop into both eyes 2 (two) times daily.   fluticasone  (FLONASE ) 50 MCG/ACT nasal spray Place 2 sprays into both nostrils daily. (Patient not taking: Reported on 04/19/2024)   No facility-administered encounter medications on file as of 04/19/2024.    Allergies (verified) Kisqali  (200 mg  dose) [ribociclib  succ (200 mg dose)]   History: 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, right (HCC) 08/30/2018   Complication of anesthesia    very sensitive to meds   Goals of care, counseling/discussion 08/30/2018   History of bone density study 09/29/2020   History of colonoscopy 11/24/2020   History of EKG 10/10/2018   History of mammogram 01/17/2020   History of MRI    Breast (03/12/2020), Brain (06/13/2019) and Knee (02/23/2018& 05/22/2018)   Osteoporosis    Personal history of chemotherapy    Shingles    Skin cancer    basal cell x 2 (on back and on nose)   Tinnitus of left ear     Past Surgical History:  Procedure Laterality Date   bone cyst removed Right 1978   BREAST BIOPSY Left 08/28/2018   CATARACT EXTRACTION Bilateral    COLON RESECTION Right 09/14/2018   Procedure: LAPAROSCOPIC  ASSISTED RIGHT COLECTOMY;  Surgeon: Gail Favorite, MD;  Location: [facility] OR;  Service: General;  Laterality: Right;   COLONOSCOPY     LAPAROSCOPIC RIGHT COLECTOMY  09/14/2018   Family History  Problem Relation Age of Onset   Dementia Mother    Congestive Heart Failure Father    Early death Paternal Aunt    Dementia Maternal Grandmother    Breast cancer Neg Hx    Social History   Socioeconomic History   Marital status: Married    Spouse name: Not on file   Number of children: Not on file   Years of education: Not on file   Highest education level: Bachelor's degree (e.g., BA, AB, BS)  Occupational History   Not on file  Tobacco Use   Smoking status: Never   Smokeless tobacco: Never  Vaping Use   Vaping status: Never Used  Substance and Sexual Activity   Alcohol use: Yes    Comment: very rarely   Drug use: Never   Sexual activity: Not Currently  Other Topics Concern   Not on file  Social History Narrative   Diet: Omnivore      Caffeine: No      Married, if yes what year: Married/1975      Do you live in a house, apartment, assisted living, condo, trailer, ect: Rental townhouse      Is it one or more stories: 2      How many persons live in your home? 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 No   Do you have difficulty preparing food or eating? No   Do you have difficulty managing your  medications? No   Do you have difficulty managing your finances? No   Do you have difficulty affording your medications? No   Social Drivers of Corporate investment banker Strain: Low Risk  (04/15/2024)   Overall Financial Resource Strain (CARDIA)    Difficulty of Paying  Living Expenses: Not hard at all  Food Insecurity: No Food Insecurity (04/15/2024)   Hunger Vital Sign    Worried About Running Out of Food in the Last Year: Never true    Ran Out of Food in the Last Year: Never true  Transportation Needs: No Transportation Needs (04/15/2024)   PRAPARE - Administrator, Civil Service (Medical): No    Lack of Transportation (Non-Medical): No  Physical Activity: Insufficiently Active (04/15/2024)   Exercise Vital Sign    Days of Exercise per Week: 4 days    Minutes of Exercise per Session: 30 min  Stress: Stress Concern Present (04/15/2024)   [employer] of Occupational Health - Occupational Stress Questionnaire    Feeling of Stress: To some extent  Social Connections: Moderately Isolated (04/15/2024)   Social Connection and Isolation Panel    Frequency of Communication with Friends and Family: Once a week    Frequency of Social Gatherings with Friends and Family: Once a week    Attends Religious Services: Never    Database administrator or Organizations: Yes    Attends Engineer, structural: More than 4 times per year    Marital Status: Married    Tobacco Counseling Counseling given: Not Answered   Clinical Intake:  Pre-visit preparation completed: Yes  Pain : 0-10 Pain Score: 3  Pain Location: Back Pain Orientation: Mid, Left Pain Radiating Towards: none Pain Descriptors / Indicators: Discomfort Pain Onset: Other (comment) (chronic) Pain Frequency: Intermittent Effect of Pain on Daily Activities: none     BMI - recorded: 22.14 Nutritional Status: BMI of 19-24  Normal Diabetes: No  How often do you need to have someone help you when  you read instructions, pamphlets, or other written materials from your doctor or pharmacy?: 1 - Never What is the last grade level you completed in school?: Bachelor's degree  Interpreter Needed?: No  Information entered by :: [place] Serpe Medina-Vargas   Activities of Daily Living    04/15/2024    2:16 PM  In your present state of health, do you have any difficulty performing the following activities:  Hearing? 0  Vision? 0  Difficulty concentrating or making decisions? 0  Walking or climbing stairs? 0  Dressing or bathing? 0  Doing errands, shopping? 0  Preparing Food and eating ? N  Using the Toilet? N  In the past six months, have you accidently leaked urine? Y  Do you have problems with loss of bowel control? N  Managing your Medications? N  Managing your Finances? N  Housekeeping or managing your Housekeeping? N    Patient Care Team: Medina-Vargas, Jereld BROCKS, NP as PCP - General (Internal Medicine) Timmy Maude SAUNDERS, MD as Medical Oncologist (Oncology) Aron Shoulders, MD as Consulting Physician (General Surgery) Robinson Pao, MD as Consulting Physician (Dermatology) Mat Browning, MD as Consulting Physician (Obstetrics and Gynecology) Corrine Slough, OD (Optometry) Medina-Vargas, Jereld BROCKS, NP as Nurse Practitioner (Internal Medicine)  Indicate any recent Medical Services you may have received from other than [facility] providers in the past year (date may be approximate).     Assessment:   This is a routine wellness examination for [facility]  Hearing/Vision screen Hearing Screening - Comments:: No hearing issues. Vision Screening - Comments:: Eye exam 01-02-2024 [facility] (issues with left eye)   Goals Addressed             This Visit's Progress    Exercise 3x per week (30 min per time)       -  plays golf, lift weight 6.6 lbs,        Depression Screen    04/19/2024   10:18 AM 08/21/2023    1:23 PM 08/08/2023   10:08 AM 07/13/2023   10:37 AM 06/19/2023    1:28  PM 04/28/2023   11:06 AM 04/11/2023    9:43 AM  PHQ 2/9 Scores  PHQ - 2 Score 0 0 0 0 0 0 0    Fall Risk    04/19/2024   10:18 AM 04/15/2024    2:16 PM 08/21/2023    1:23 PM 08/08/2023   10:08 AM 07/13/2023   10:37 AM  Fall Risk   Falls in the past year? 0 0 0 0 0  Number falls in past yr: 0 0 0 0 0  Injury with Fall? 0 0 0 0 0  Risk for fall due to : No Fall Risks  No Fall Risks No Fall Risks No Fall Risks  Follow up Falls evaluation completed  Falls evaluation completed Falls evaluation completed Falls evaluation completed    MEDICARE RISK AT HOME: Medicare Risk at Home Any stairs in or around the home?: (Patient-Rptd) Yes If so, are there any without handrails?: (Patient-Rptd) No Home free of loose throw rugs in walkways, pet beds, electrical cords, etc?: (Patient-Rptd) Yes Adequate lighting in your home to reduce risk of falls?: (Patient-Rptd) Yes Life alert?: (Patient-Rptd) No Use of a cane, walker or w/c?: (Patient-Rptd) No Grab bars in the bathroom?: (Patient-Rptd) No Shower chair or bench in shower?: (Patient-Rptd) No Elevated toilet seat or a handicapped toilet?: (Patient-Rptd) No  TIMED UP AND GO:  Was the test performed?  Yes  Length of time to ambulate 10 feet: <10  sec Gait slow and steady without use of assistive device    Cognitive Function:        04/19/2024   10:19 AM 04/11/2023    9:47 AM 04/05/2022    8:50 AM 03/30/2021    8:20 AM  6CIT Screen  What Year? 0 points 0 points 0 points 0 points  What month? 0 points 0 points 0 points 0 points  What time? 0 points 0 points 0 points 0 points  Count back from 20 0 points 0 points 0 points 0 points  Months in reverse 0 points 0 points 0 points 0 points  Repeat phrase 0 points 0 points 4 points 0 points  Total Score 0 points 0 points 4 points 0 points    Immunizations Immunization History  Administered Date(s) Administered   Fluzone Influenza virus vaccine,trivalent (IIV3), split virus 04/16/2009,  05/22/2011   INFLUENZA, HIGH DOSE SEASONAL PF 05/30/2016, 04/28/2017, 06/04/2018, 05/01/2019, 05/07/2021, 04/30/2022, 03/04/2023   Influenza, Quadrivalent, Recombinant, Inj, Pf 04/19/2018   Influenza,inj,quad, With Preservative 06/03/2015   Influenza-Unspecified 06/04/2018, 05/01/2019, 04/23/2020   Moderna Covid-19 Fall Seasonal Vaccine 47yrs & older 06/18/2022   Moderna SARS-COV2 Booster Vaccination 01/23/2021   Moderna Sars-Covid-2 Vaccination 08/15/2019, 09/13/2019, 05/15/2020   PFIZER Comirnaty(Gray Top)Covid-19 Tri-Sucrose Vaccine 03/04/2023   Pfizer Covid-19 Vaccine Bivalent Booster 68yrs & up 04/16/2021, 11/05/2021   Pfizer(Comirnaty)Fall Seasonal Vaccine 12 years and older 10/14/2023   Pneumococcal Conjugate-13 04/28/2017   Pneumococcal Polysaccharide-23 06/20/2018   Tdap 06/21/2011, 07/08/2022   Zoster Recombinant(Shingrix) 03/22/2012, 09/09/2023   Zoster, Live 03/22/2012    TDAP status: Up to date  Flu Vaccine status: Due, Education has been provided regarding the importance of this vaccine. Advised may receive this vaccine at local pharmacy or Health Dept. Aware to provide a copy of  the vaccination record if obtained from local pharmacy or Health Dept. Verbalized acceptance and understanding.  Pneumococcal vaccine status: Up to date  Covid-19 vaccine status: Information provided on how to obtain vaccines.   Qualifies for Shingles Vaccine? Yes   Zostavax completed Yes   Shingrix Completed?: Yes  Screening Tests Health Maintenance  Topic Date Due   Influenza Vaccine  05/21/2024 (Originally 02/02/2024)   COVID-19 Vaccine (9 - 2025-26 season) 06/19/2024 (Originally 03/04/2024)   Mammogram  08/24/2024   DEXA SCAN  10/16/2024   Medicare Annual Wellness (AWV)  04/19/2025   Colonoscopy  01/30/2027   DTaP/Tdap/Td (3 - Td or Tdap) 07/08/2032   Pneumococcal Vaccine: 50+ Years  Completed   Hepatitis C Screening  Completed   Zoster Vaccines- Shingrix  Completed   Meningococcal B  Vaccine  Aged Out    Health Maintenance  There are no preventive care reminders to display for this patient.   Colorectal cancer screening: Type of screening: Colonoscopy. Completed 01/2924. Repeat every 5 years  Mammogram status: Completed 08/25/23. Repeat every year  Bone Density status: Completed 10/17/22. Results reflect: Bone density results: OSTEOPOROSIS. Repeat every 2 years.  Lung Cancer Screening: (Low Dose CT Chest recommended if Age 33-80 years, 20 pack-year currently smoking OR have quit w/in 15years.) does not qualify.   Lung Cancer Screening Referral: N/A  Additional Screening:  Hepatitis C Screening: does not qualify; Completed Not done  Vision Screening: Recommended annual ophthalmology exams for early detection of glaucoma and other disorders of the eye. Is the patient up to date with their annual eye exam?  Yes  Who is the provider or what is the name of the office in which the patient attends annual eye exams? [facility] If pt is not established with a provider, would they like to be referred to a provider to establish care? No .   Dental Screening: Recommended annual dental exams for proper oral hygiene  Diabetic Foot Exam: Diabetic Foot Exam: Overdue, Pt has been advised about the importance in completing this exam. Pt is scheduled for diabetic foot exam on Not diabetic.  Community Resource Referral / Chronic Care Management: CRR required this visit?  No   CCM required this visit?  No     Plan:     I have personally reviewed and noted the following in the patient's chart:   Medical and social history Use of alcohol, tobacco or illicit drugs  Current medications and supplements including opioid prescriptions. Patient is not currently taking opioid prescriptions. Functional ability and status Nutritional status Physical activity Advanced directives List of other physicians Hospitalizations, surgeries, and ER visits in previous 12  months Vitals Screenings to include cognitive, depression, and falls Referrals and appointments  In addition, I have reviewed and discussed with patient certain preventive protocols, quality metrics, and best practice recommendations. A written personalized care plan for preventive services as well as general preventive health recommendations were provided to patient.     Tarez Bowns Medina-Vargas, NP   04/19/2024   After Visit Summary: (In Person-Printed) AVS printed and given to the patient  Nurse Notes: Needs to schedule for next year AWV.

## 2024-04-25 ENCOUNTER — Encounter: Payer: Self-pay | Admitting: Orthopedic Surgery

## 2024-04-25 ENCOUNTER — Ambulatory Visit (INDEPENDENT_AMBULATORY_CARE_PROVIDER_SITE_OTHER): Admitting: Orthopedic Surgery

## 2024-04-25 VITALS — BP 120/80 | HR 65 | Temp 98.5°F | Ht 66.0 in | Wt 135.2 lb

## 2024-04-25 DIAGNOSIS — R1013 Epigastric pain: Secondary | ICD-10-CM | POA: Diagnosis not present

## 2024-04-25 DIAGNOSIS — K2100 Gastro-esophageal reflux disease with esophagitis, without bleeding: Secondary | ICD-10-CM | POA: Diagnosis not present

## 2024-04-25 LAB — CBC WITH DIFFERENTIAL/PLATELET
Absolute Lymphocytes: 1311 {cells}/uL (ref 850–3900)
Absolute Monocytes: 340 {cells}/uL (ref 200–950)
Basophils Absolute: 32 {cells}/uL (ref 0–200)
Basophils Relative: 0.7 %
Eosinophils Absolute: 32 {cells}/uL (ref 15–500)
Eosinophils Relative: 0.7 %
HCT: 36.8 % (ref 35.0–45.0)
Hemoglobin: 12.5 g/dL (ref 11.7–15.5)
MCH: 32.4 pg (ref 27.0–33.0)
MCHC: 34 g/dL (ref 32.0–36.0)
MCV: 95.3 fL (ref 80.0–100.0)
MPV: 13.6 fL — ABNORMAL HIGH (ref 7.5–12.5)
Monocytes Relative: 7.4 %
Neutro Abs: 2884 {cells}/uL (ref 1500–7800)
Neutrophils Relative %: 62.7 %
Platelets: 88 Thousand/uL — ABNORMAL LOW (ref 140–400)
RBC: 3.86 Million/uL (ref 3.80–5.10)
RDW: 13.1 % (ref 11.0–15.0)
Total Lymphocyte: 28.5 %
WBC: 4.6 Thousand/uL (ref 3.8–10.8)

## 2024-04-25 LAB — COMPREHENSIVE METABOLIC PANEL WITH GFR
AG Ratio: 1.7 (calc) (ref 1.0–2.5)
ALT: 16 U/L (ref 6–29)
AST: 24 U/L (ref 10–35)
Albumin: 4.2 g/dL (ref 3.6–5.1)
Alkaline phosphatase (APISO): 73 U/L (ref 37–153)
BUN: 14 mg/dL (ref 7–25)
CO2: 27 mmol/L (ref 20–32)
Calcium: 9.5 mg/dL (ref 8.6–10.4)
Chloride: 99 mmol/L (ref 98–110)
Creat: 0.91 mg/dL (ref 0.60–1.00)
Globulin: 2.5 g/dL (ref 1.9–3.7)
Glucose, Bld: 92 mg/dL (ref 65–139)
Potassium: 4.1 mmol/L (ref 3.5–5.3)
Sodium: 134 mmol/L — ABNORMAL LOW (ref 135–146)
Total Bilirubin: 0.4 mg/dL (ref 0.2–1.2)
Total Protein: 6.7 g/dL (ref 6.1–8.1)
eGFR: 67 mL/min/1.73m2 (ref >=60)

## 2024-04-25 LAB — AMYLASE: Amylase: 53 U/L (ref 21–101)

## 2024-04-25 LAB — LIPASE: Lipase: 27 U/L (ref 7–60)

## 2024-04-25 MED ORDER — CETIRIZINE HCL 10 MG PO TABS: 10.0000 mg | ORAL_TABLET | Freq: Every day | ORAL | Status: DC | PRN

## 2024-04-25 MED ORDER — PANTOPRAZOLE SODIUM 40 MG PO TBEC: 40.0000 mg | DELAYED_RELEASE_TABLET | Freq: Every day | ORAL | 3 refills | Status: DC

## 2024-04-25 NOTE — Progress Notes (Signed)
 Careteam: Patient Care Team: Medina-Vargas, Jereld BROCKS, NP as PCP - General (Internal Medicine) Timmy Maude SAUNDERS, MD as Medical Oncologist (Oncology) Aron Shoulders, MD as Consulting Physician (General Surgery) Robinson Pao, MD as Consulting Physician (Dermatology) Mat Browning, MD as Consulting Physician (Obstetrics and Gynecology) Corrine Slough, OD (Optometry) Medina-Vargas, Jereld BROCKS, NP as Nurse Practitioner (Internal Medicine)  Seen by: Greig Cluster, AGNP-C  PLACE OF SERVICE:  [facility]  Advanced Directive information    Allergies  Allergen Reactions   Kisqali  (200 Mg Dose) [Ribociclib  Succ (200 Mg Dose)] Itching and Rash    scratchy throat.    Chief Complaint  Patient presents with   Acute Visit    Gastric issues patient its states its been going on since 04/07/24. Patient states has bloating, nausea, back pain and fatigue.      HPI: Patient is a 73 y.o. female seen today for acute visit due to abdominal pain.   Discussed the use of AI scribe software for clinical note transcription with the patient, who gave verbal consent to proceed.  History of Present Illness   PAIZLEE KINDER is a 73 year old female with a history of partial colectomy who presents with persistent gastric issues.  H/o metastatic breast cancer s/p partial colectomy 2020. She began experiencing gastric issues on October 5th after consuming a meal containing kale, initially presenting with cramps, diarrhea, fatigue, heartburn, and gas. These symptoms recurred two days later after consuming leftover kale. Since then, she has experienced intermittent heartburn, gas, occasional nausea, and persistent fatigue.  She has been using famotidine as needed for symptoms. She has also used simethicone for gas relief, which provided some relief but was not long-lasting. Recently, she used chewable antacids, which helped with heartburn but coincided with the onset of severe back pain, described as 'aching,  stabbing pain.'  She has a history of a sensitive digestive system, particularly since undergoing a partial colectomy. She avoids known triggers such as juices, tomatoes, and chocolate, and has adjusted her diet over the past six years to manage her symptoms. Despite these efforts, her digestive system has felt 'completely out of sync' since a colonoscopy at the end of July.  No fever or vomiting. Her husband consumed the same kale without issue. She denies any history of kidney stones but recalls experiencing pain when taking calcium supplements in the past.      Review of Systems:  Review of Systems  Constitutional:  Positive for malaise/fatigue.  HENT: Negative.    Respiratory: Negative.    Cardiovascular: Negative.   Gastrointestinal:  Positive for abdominal pain, diarrhea and heartburn.  Musculoskeletal:  Positive for back pain.  Skin: Negative.   Neurological: Negative.   Psychiatric/Behavioral: Negative.      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, right (HCC) 08/30/2018   Complication of anesthesia    very sensitive to meds   Goals of care, counseling/discussion 08/30/2018   History of bone density study 09/29/2020   History of colonoscopy 11/24/2020   History of EKG 10/10/2018   History of mammogram 01/17/2020   History of MRI    Breast (03/12/2020), Brain (06/13/2019) and Knee (02/23/2018& 05/22/2018)   Osteoporosis    Personal history of chemotherapy    Shingles    Skin cancer    basal cell x 2 (on back and on nose)   Tinnitus of left ear    Past Surgical History:  Procedure Laterality Date   bone  cyst removed Right 1978   BREAST BIOPSY Left 08/28/2018   CATARACT EXTRACTION Bilateral    COLON RESECTION Right 09/14/2018   Procedure: LAPAROSCOPIC  ASSISTED RIGHT COLECTOMY;  Surgeon: Gail Favorite, MD;  [facility] OR;  Service: General;  Laterality: Right;   COLONOSCOPY     LAPAROSCOPIC RIGHT COLECTOMY  09/14/2018    Social History:   reports that she has never smoked. She has never used smokeless tobacco. She reports current alcohol use. She reports that she does not use drugs.  Family History  Problem Relation Age of Onset   Dementia Mother    Congestive Heart Failure Father    Early death Paternal Aunt    Dementia Maternal Grandmother    Breast cancer Neg Hx     Medications: Patient's Medications  New Prescriptions   No medications on file  Previous Medications   ABEMACICLIB  (VERZENIO ) 100 MG TABLET    Take 1 tablet (100 mg total) by mouth daily.   CETIRIZINE  (ZYRTEC ) 10 MG TABLET    Take 1 tablet (10 mg total) by mouth daily.   CHOLECALCIFEROL (VITAMIN D3 PO)    Take 2,000 Units by mouth daily.   DENOSUMAB  (PROLIA ) 60 MG/ML SOSY INJECTION    Inject 60 mg into the skin every 6 (six) months.   DICLOFENAC  SODIUM (VOLTAREN ) 1 % GEL    Apply 2 g topically 4 (four) times daily as needed.   FLUTICASONE  (FLONASE ) 50 MCG/ACT NASAL SPRAY    Place 2 sprays into both nostrils daily.   LETROZOLE  (FEMARA ) 2.5 MG TABLET    Take 1 tablet (2.5 mg total) by mouth daily.   LOPERAMIDE (IMODIUM) 2 MG CAPSULE    Take 2 mg by mouth as needed.   PSYLLIUM (METAMUCIL) 48.57 % POWD    Take 1 packet by mouth daily.   VEVYE 0.1 % SOLN    Place 1 drop into both eyes 2 (two) times daily.  Modified Medications   No medications on file  Discontinued Medications   No medications on file    Physical Exam:  Vitals:   04/25/24 1110  BP: 120/80  Pulse: 65  Temp: 98.5 F (36.9 C)  SpO2: 97%  Weight: 135 lb 3.2 oz (61.3 kg)  Height: 5' 6 (1.676 m)   Body mass index is 21.82 kg/m. Wt Readings from Last 3 Encounters:  04/25/24 135 lb 3.2 oz (61.3 kg)  04/19/24 135 lb (61.2 kg)  02/27/24 132 lb (59.9 kg)    Physical Exam Vitals reviewed.  Constitutional:      General: She is not in acute distress. HENT:     Head: Normocephalic.  Eyes:     General:        Right eye: No discharge.        Left eye: No  discharge.  Cardiovascular:     Rate and Rhythm: Normal rate and regular rhythm.     Pulses: Normal pulses.     Heart sounds: Normal heart sounds.  Pulmonary:     Effort: Pulmonary effort is normal.     Breath sounds: Normal breath sounds.  Abdominal:     General: Bowel sounds are normal. There is no distension.     Palpations: Abdomen is soft. There is no mass.     Tenderness: There is abdominal tenderness in the epigastric area and left upper quadrant. There is no right CVA tenderness, left CVA tenderness, guarding or rebound.     Hernia: No hernia is present.  Musculoskeletal:  General: Normal range of motion.     Cervical back: Neck supple.  Skin:    General: Skin is warm.     Capillary Refill: Capillary refill takes less than 2 seconds.  Neurological:     General: No focal deficit present.     Mental Status: She is alert and oriented to person, place, and time.  Psychiatric:        Mood and Affect: Mood normal.     Labs reviewed: Basic Metabolic Panel: Recent Labs    07/25/23 1028 10/24/23 1030 02/27/24 1148  NA 139 140 139  K 4.7 4.5 5.1  CL 103 104 103  CO2 28 29 25   GLUCOSE 79 87 113*  BUN 16 18 18   CREATININE 1.05* 1.17* 1.15*  CALCIUM 9.3 9.4 10.0   Liver Function Tests: Recent Labs    07/25/23 1028 10/24/23 1030 02/27/24 1148  AST 18 19 27   ALT 11 12 21   ALKPHOS 54 60 74  BILITOT 0.3 0.3 0.3  PROT 6.5 6.5 6.9  ALBUMIN 4.0 4.1 4.2   No results for input(s): LIPASE, AMYLASE in the last 8760 hours. No results for input(s): AMMONIA in the last 8760 hours. CBC: Recent Labs    07/25/23 1028 10/24/23 1030 02/27/24 1148  WBC 6.0 5.0 5.2  NEUTROABS 3.5 2.8 3.1  HGB 12.4 12.7 12.8  HCT 37.3 37.2 38.4  MCV 97.4 96.6 96.0  PLT 178 113* 98*   Lipid Panel: No results for input(s): CHOL, HDL, LDLCALC, TRIG, CHOLHDL, LDLDIRECT in the last 8760 hours. TSH: No results for input(s): TSH in the last 8760 hours. A1C: Lab  Results  Component Value Date   HGBA1C 5.4 09/03/2018     Assessment/Plan 1. Epigastric pain (Primary) - onset 10/05 after eating kale  - unsuccessful trial famotidine and simethicone  - increased tenderness to epigastric and LUQ, some radiation of pain to left lower back, afebrile - differentials: GERD, gastritis, pancreatic involvement, renal calculi, gallstones  - CBC with Differential/Platelet - Complete Metabolic Panel with eGFR - Lipase - Amylase - consider KUB in future  2. Gastroesophageal reflux disease with esophagitis without hemorrhage - discussed trying pantoprazole  but refused due to past h/o legs aching - consider trial omeprazole in future  - cont to avoid food triggers  Total time: 31 minutes. Greater than 50% of total time spent doing patient education regarding upper abdominal pain, GERD including symptom/medication management.     Next appt: Visit date not found  Colten Desroches Gil BODILY  [facility] [phone]

## 2024-04-25 NOTE — Patient Instructions (Signed)
Avoid food triggers

## 2024-04-26 ENCOUNTER — Ambulatory Visit: Admitting: Adult Health

## 2024-04-26 ENCOUNTER — Ambulatory Visit: Payer: Self-pay | Admitting: Orthopedic Surgery

## 2024-04-29 ENCOUNTER — Inpatient Hospital Stay: Attending: Hematology & Oncology

## 2024-04-29 VITALS — BP 122/48 | HR 72 | Temp 98.2°F | Resp 20

## 2024-04-29 DIAGNOSIS — M810 Age-related osteoporosis without current pathological fracture: Secondary | ICD-10-CM | POA: Diagnosis not present

## 2024-04-29 DIAGNOSIS — C50911 Malignant neoplasm of unspecified site of right female breast: Secondary | ICD-10-CM

## 2024-04-29 MED ORDER — DENOSUMAB 60 MG/ML ~~LOC~~ SOSY
60.0000 mg | PREFILLED_SYRINGE | Freq: Once | SUBCUTANEOUS | Status: AC
  Administered 2024-04-29: 60 mg via SUBCUTANEOUS
  Filled 2024-04-29: qty 1

## 2024-04-29 NOTE — Patient Instructions (Signed)
Denosumab Injection (Osteoporosis) What is this medication? DENOSUMAB (den oh SUE mab) prevents and treats osteoporosis. It works by making your bones stronger and less likely to break (fracture). It is a monoclonal antibody. This medicine may be used for other purposes; ask your health care provider or pharmacist if you have questions. COMMON BRAND NAME(S): Prolia What should I tell my care team before I take this medication? They need to know if you have any of these conditions: Dental or gum disease, or plan to have dental surgery or a tooth pulled Infection Kidney disease Low levels of calcium or vitamin D in your blood On dialysis Poor nutrition Skin conditions Thyroid disease, or have had thyroid or parathyroid surgery Trouble absorbing minerals in your stomach or intestine An unusual or allergic reaction to denosumab, other medications, foods, dyes, or preservatives Pregnant or trying to get pregnant Breastfeeding How should I use this medication? This medication is injected under the skin. It is given by your care team in a hospital or clinic setting. A special MedGuide will be given to you before each treatment. Be sure to read this information carefully each time. Talk to your care team about the use of this medication in children. Special care may be needed. Overdosage: If you think you have taken too much of this medicine contact a poison control center or emergency room at once. NOTE: This medicine is only for you. Do not share this medicine with others. What if I miss a dose? Keep appointments for follow-up doses. It is important not to miss your dose. Call your care team if you are unable to keep an appointment. What may interact with this medication? Do not take this medication with any of the following: Other medications that contain denosumab This medication may also interact with the following: Medications that lower your chance of fighting infection Steroid  medications, such as prednisone or cortisone This list may not describe all possible interactions. Give your health care provider a list of all the medicines, herbs, non-prescription drugs, or dietary supplements you use. Also tell them if you smoke, drink alcohol, or use illegal drugs. Some items may interact with your medicine. What should I watch for while using this medication? Your condition will be monitored carefully while you are receiving this medication. You may need blood work while taking this medication. This medication may increase your risk of getting an infection. Call your care team for advice if you get a fever, chills, sore throat, or other symptoms of a cold or flu. Do not treat yourself. Try to avoid being around people who are sick. Tell your dentist and dental surgeon that you are taking this medication. You should not have major dental surgery while on this medication. See your dentist to have a dental exam and fix any dental problems before starting this medication. Take good care of your teeth while on this medication. Make sure you see your dentist for regular follow-up appointments. You should make sure you get enough calcium and vitamin D while you are taking this medication. Discuss the foods you eat and the vitamins you take with your care team. Talk to your care team if you are pregnant or think you might be pregnant. This medication can cause serious birth defects if taken during pregnancy and for 5 months after the last dose. You will need a negative pregnancy test before starting this medication. Contraception is recommended while taking this medication and for 5 months after the last dose. Your care   team can help you find the option that works for you. Talk to your care team before breastfeeding. Changes to your treatment plan may be needed. What side effects may I notice from receiving this medication? Side effects that you should report to your care team as soon as  possible: Allergic reactions--skin rash, itching, hives, swelling of the face, lips, tongue, or throat Infection--fever, chills, cough, sore throat, wounds that don't heal, pain or trouble when passing urine, general feeling of discomfort or being unwell Low calcium level--muscle pain or cramps, confusion, tingling, or numbness in the hands or feet Osteonecrosis of the jaw--pain, swelling, or redness in the mouth, numbness of the jaw, poor healing after dental work, unusual discharge from the mouth, visible bones in the mouth Severe bone, joint, or muscle pain Skin infection--skin redness, swelling, warmth, or pain Side effects that usually do not require medical attention (report these to your care team if they continue or are bothersome): Back pain Headache Joint pain Muscle pain Pain in the hands, arms, legs, or feet Runny or stuffy nose Sore throat This list may not describe all possible side effects. Call your doctor for medical advice about side effects. You may report side effects to FDA at 1-800-FDA-1088. Where should I keep my medication? This medication is given in a hospital or clinic. It will not be stored at home. NOTE: This sheet is a summary. It may not cover all possible information. If you have questions about this medicine, talk to your doctor, pharmacist, or health care provider.  2023 Elsevier/Gold Standard (2021-11-01 00:00:00)  

## 2024-04-30 ENCOUNTER — Ambulatory Visit
Admission: RE | Admit: 2024-04-30 | Discharge: 2024-04-30 | Disposition: A | Discharge status: Home / Self Care | Source: Ambulatory Visit | Attending: General Surgery | Admitting: General Surgery

## 2024-04-30 DIAGNOSIS — C50911 Malignant neoplasm of unspecified site of right female breast: Secondary | ICD-10-CM

## 2024-04-30 DIAGNOSIS — D0502 Lobular carcinoma in situ of left breast: Secondary | ICD-10-CM | POA: Diagnosis not present

## 2024-04-30 MED ORDER — GADOPICLENOL 0.5 MMOL/ML IV SOLN
6.0000 mL | Freq: Once | INTRAVENOUS | Status: AC | PRN
  Administered 2024-04-30: 6 mL via INTRAVENOUS

## 2024-05-04 DIAGNOSIS — Z23 Encounter for immunization: Secondary | ICD-10-CM | POA: Diagnosis not present

## 2024-05-07 ENCOUNTER — Ambulatory Visit (INDEPENDENT_AMBULATORY_CARE_PROVIDER_SITE_OTHER): Payer: Self-pay | Admitting: Adult Health

## 2024-05-07 ENCOUNTER — Encounter: Payer: Self-pay | Admitting: Adult Health

## 2024-05-07 VITALS — BP 116/68 | HR 71 | Temp 97.7°F | Ht 66.0 in | Wt 135.2 lb

## 2024-05-07 DIAGNOSIS — K219 Gastro-esophageal reflux disease without esophagitis: Secondary | ICD-10-CM | POA: Diagnosis not present

## 2024-05-07 DIAGNOSIS — R1012 Left upper quadrant pain: Secondary | ICD-10-CM | POA: Diagnosis not present

## 2024-05-07 DIAGNOSIS — R1013 Epigastric pain: Secondary | ICD-10-CM

## 2024-05-07 DIAGNOSIS — M810 Age-related osteoporosis without current pathological fracture: Secondary | ICD-10-CM | POA: Diagnosis not present

## 2024-05-07 DIAGNOSIS — C50919 Malignant neoplasm of unspecified site of unspecified female breast: Secondary | ICD-10-CM | POA: Diagnosis not present

## 2024-05-07 NOTE — Progress Notes (Signed)
 [facility] clinic  Provider:  Jereld Serum DNP  Code Status: Full Code  Goals of Care:     04/19/2024   10:16 AM  Advanced Directives  Does Patient Have a Medical Advance Directive? Yes  Type of Estate Agent of [place];Out of facility DNR (pink MOST or yellow form)  Does patient want to make changes to medical advance directive? No - Patient declined  Copy of Healthcare Power of Attorney in Chart? Yes - validated most recent copy scanned in chart (See row information)     Chief Complaint  Patient presents with   Follow-up    1 week follow up for abdominal pain. Patient states that its better.   Back Pain    Occasional tightness mostly in evening    Discussed the use of AI scribe software for clinical note transcription with the patient, who gave verbal consent to proceed.  HPI: Patient is a 73 y.o. Osborne seen today for an acute visit for abdominal pain follow up.  She initially experienced generalized and severe abdominal pain, particularly intense in the left upper quadrant, accompanied by heartburn, belching, and nausea. She currently rates her pain as zero out of ten. She is concerned about recurrence.  Two years ago, she had a similar episode and was prescribed pantoprazole , which she did not complete. She suspects a bad batch of kale may have triggered her recent symptoms, as she experienced severe abdominal pain and diarrhea after consuming it. Her bowel movements have become irregular since starting pantoprazole , with occasional missed days, despite taking Metamucil.  Her past medical history includes breast cancer, for which she takes Verzenio  100 mg daily and Femara  2.5 mg daily. She also has osteoporosis and receives Prolia  60 mg injections every six months. She is concerned about gastrointestinal side effects from her medications.  She has a history of colon resection, resulting in a tendency towards loose bowel movements. Her bowel movements  have been more regular with Metamucil, which she takes for its fiber content.  She exercises regularly, walking at least 30 minutes since her last wellness visit, and is cautious with weight lifting due to a previous back strain. She feels frustrated with ongoing health issues throughout the year, feeling that she has been dealing with one health problem after another.       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, right (HCC) 08/30/2018   Complication of anesthesia    very sensitive to meds   Goals of care, counseling/discussion 08/30/2018   History of bone density study 09/29/2020   History of colonoscopy 11/24/2020   History of EKG 10/10/2018   History of mammogram 01/17/2020   History of MRI    Breast (03/12/2020), Brain (06/13/2019) and Knee (02/23/2018& 05/22/2018)   Osteoporosis    Personal history of chemotherapy    Shingles    Skin cancer    basal cell x 2 (on back and on nose)   Tinnitus of left ear     Past Surgical History:  Procedure Laterality Date   bone cyst removed Right 1978   BREAST BIOPSY Left 08/28/2018   CATARACT EXTRACTION Bilateral    COLON RESECTION Right 09/14/2018   Procedure: LAPAROSCOPIC  ASSISTED RIGHT COLECTOMY;  Surgeon: Gail Favorite, MD;  Location: [facility] OR;  Service: General;  Laterality: Right;   COLONOSCOPY     LAPAROSCOPIC RIGHT COLECTOMY  09/14/2018    Allergies  Allergen Reactions   Kisqali  (200 Mg Dose) [Ribociclib   Succ (200 Mg Dose)] Itching and Rash    scratchy throat.    Outpatient Encounter Medications as of 05/07/2024  Medication Sig   abemaciclib  (VERZENIO ) 100 MG tablet Take 1 tablet (100 mg total) by mouth daily.   Cholecalciferol (VITAMIN D3 PO) Take 2,000 Units by mouth daily.   denosumab  (PROLIA ) 60 MG/ML SOSY injection Inject 60 mg into the skin every 6 (six) months.   diclofenac  Sodium (VOLTAREN ) 1 % GEL Apply 2 g topically 4 (four) times daily as needed.   letrozole   (FEMARA ) 2.5 MG tablet Take 1 tablet (2.5 mg total) by mouth daily.   loperamide (IMODIUM) 2 MG capsule Take 2 mg by mouth as needed.   pantoprazole  (PROTONIX ) 40 MG tablet Take 40 mg by mouth daily.   Psyllium (METAMUCIL) 48.57 % POWD Take 1 packet by mouth daily.   VEVYE 0.1 % SOLN Place 1 drop into both eyes 2 (two) times daily.   No facility-administered encounter medications on file as of 05/07/2024.    Review of Systems:  Review of Systems  Constitutional:  Negative for appetite change, chills, fatigue and fever.  HENT:  Negative for congestion, hearing loss, rhinorrhea and sore throat.   Eyes: Negative.   Respiratory:  Negative for cough, shortness of breath and wheezing.   Cardiovascular:  Negative for chest pain, palpitations and leg swelling.  Gastrointestinal:  Positive for abdominal pain and nausea. Negative for constipation, diarrhea and vomiting.  Genitourinary:  Negative for dysuria.  Musculoskeletal:  Negative for arthralgias, back pain and myalgias.  Skin:  Negative for color change, rash and wound.  Neurological:  Negative for dizziness, weakness and headaches.  Psychiatric/Behavioral:  Negative for behavioral problems. The patient is not nervous/anxious.     Health Maintenance  Topic Date Due   Influenza Vaccine  05/21/2024 (Originally 02/02/2024)   COVID-19 Vaccine (9 - 2025-26 season) 06/19/2024 (Originally 03/04/2024)   DEXA SCAN  10/16/2024   Medicare Annual Wellness (AWV)  04/19/2025   Mammogram  04/30/2025   Colonoscopy  01/30/2027   DTaP/Tdap/Td (3 - Td or Tdap) 07/08/2032   Pneumococcal Vaccine: 50+ Years  Completed   Hepatitis C Screening  Completed   Zoster Vaccines- Shingrix  Completed   Meningococcal B Vaccine  Aged Out    Physical Exam: Vitals:   05/07/24 1122  BP: 116/68  Pulse: 71  Temp: 97.7 F (36.5 C)  TempSrc: Temporal  SpO2: 99%  Weight: 135 lb 3.2 oz (61.3 kg)  Height: 5' 6 (1.676 m)   Body mass index is 21.82 kg/m. Physical  Exam Constitutional:      General: She is not in acute distress.    Appearance: Normal appearance.  HENT:     Head: Normocephalic and atraumatic.     Nose: Nose normal.     Mouth/Throat:     Mouth: Mucous membranes are moist.  Eyes:     Conjunctiva/sclera: Conjunctivae normal.  Cardiovascular:     Rate and Rhythm: Normal rate and regular rhythm.  Pulmonary:     Effort: Pulmonary effort is normal.     Breath sounds: Normal breath sounds.  Abdominal:     General: Bowel sounds are normal.     Palpations: Abdomen is soft.  Musculoskeletal:        General: Normal range of motion.     Cervical back: Normal range of motion.  Skin:    General: Skin is warm and dry.  Neurological:     General: No focal deficit present.  Mental Status: She is alert and oriented to person, place, and time.  Psychiatric:        Mood and Affect: Mood normal.        Behavior: Behavior normal.        Thought Content: Thought content normal.        Judgment: Judgment normal.     Labs reviewed: Basic Metabolic Panel: Recent Labs    10/24/23 1030 02/27/24 1148 04/25/24 1153  NA 140 139 134*  K 4.5 5.1 4.1  CL 104 103 99  CO2 29 25 27   GLUCOSE 87 113* 92  BUN 18 18 14   CREATININE 1.17* 1.15* 0.91  CALCIUM 9.4 10.0 9.5   Liver Function Tests: Recent Labs    07/25/23 1028 10/24/23 1030 02/27/24 1148 04/25/24 1153  AST 18 19 27 24   ALT 11 12 21 16   ALKPHOS 54 60 74  --   BILITOT 0.3 0.3 0.3 0.4  PROT 6.5 6.5 6.9 6.7  ALBUMIN 4.0 4.1 4.2  --    Recent Labs    04/25/24 1153  LIPASE 27  AMYLASE 53   No results for input(s): AMMONIA in the last 8760 hours. CBC: Recent Labs    10/24/23 1030 02/27/24 1148 04/25/24 1153  WBC 5.0 5.2 4.6  NEUTROABS 2.8 3.1 2,884  HGB 12.7 12.8 12.5  HCT 37.2 38.4 36.8  MCV 96.6 96.0 95.3  PLT 113* 98* 88*   Lipid Panel: No results for input(s): CHOL, HDL, LDLCALC, TRIG, CHOLHDL, LDLDIRECT in the last 8760 hours. 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 The patient has not had surgery, but has been treated with chemotherapy and is on letrozole . EXAM: BILATERAL BREAST MRI WITH AND WITHOUT CONTRAST TECHNIQUE: Multiplanar, multisequence MR images of both breasts were obtained prior to and following the intravenous administration of 6 ml of Vueway  Three-dimensional MR images were rendered by post-processing of the original MR data on an independent workstation. The three-dimensional MR images were interpreted, and findings are reported in the following complete MRI report for this study. Three dimensional images were evaluated at the independent interpreting workstation using the DynaCAD thin client. COMPARISON:  Previous exam(s). FINDINGS: Breast composition: c. Heterogeneous fibroglandular tissue. Background parenchymal enhancement: Mild Right breast: No mass or abnormal enhancement. Left breast: No mass or abnormal enhancement. Susceptibility artifact in the lower-inner quadrant of the left breast from the biopsy clip. No suspicious enhancement surrounding the biopsy clip. Lymph nodes: No abnormal appearing lymph nodes. Ancillary findings:  None. IMPRESSION: No abnormal enhancement in either breast. RECOMMENDATION: 1. Continued treatment planning of the known metastatic lobular left breast cancer is recommended. BI-RADS CATEGORY  6: Known biopsy-proven malignancy. Electronically Signed   By: Dina  Arceo M.D.   On: 04/30/2024 11:47    Assessment/Plan  1. Left upper quadrant abdominal pain (Primary)  Gastroesophageal reflux disease without esophagitis -  improved with pantoprazole . Current pain 0/10. Concerned about recurrence and long-term pantoprazole  use. Likely gastritis, no ulcers or  infection. - Continue pantoprazole  for six weeks, then discontinue if symptoms do not recur. - Use pantoprazole  as needed after six weeks if symptoms return. - Avoid alcohol and spicy foods.  3. Primary malignant neoplasm of breast with metastasis (HCC) -  Under active treatment with Verzenio   and Femara . Regular oncology follow-ups. Frustrated with side effects: gastrointestinal issues and irritability. - Continue Verzenio  and Femara  as prescribed. - Follow up with oncology as scheduled.  4. Osteoporosis without current pathological fracture, unspecified osteoporosis type -  Receives Prolia  injections. Concerned about pantoprazole 's bone weakening effects due to osteoporosis. - Continue Prolia  injections every six months.     Labs/tests ordered:   None   Return in about 3 months (around 08/07/2024).  Naithen Rivenburg Medina-Vargas, NP

## 2024-05-09 ENCOUNTER — Other Ambulatory Visit: Payer: Self-pay | Admitting: Hematology & Oncology

## 2024-05-09 ENCOUNTER — Other Ambulatory Visit: Payer: Self-pay

## 2024-05-09 ENCOUNTER — Other Ambulatory Visit (HOSPITAL_COMMUNITY): Payer: Self-pay

## 2024-05-09 MED ORDER — ABEMACICLIB 100 MG PO TABS
100.0000 mg | ORAL_TABLET | Freq: Every day | ORAL | 6 refills | Status: AC
  Filled 2024-05-10: qty 42, 42d supply, fill #0
  Filled 2024-05-13: qty 28, 28d supply, fill #0
  Filled 2024-06-10: qty 28, 28d supply, fill #1
  Filled 2024-07-03: qty 28, 28d supply, fill #2
  Filled 2024-08-02 – 2024-08-07 (×2): qty 28, 28d supply, fill #3

## 2024-05-10 ENCOUNTER — Other Ambulatory Visit: Payer: Self-pay

## 2024-05-13 ENCOUNTER — Other Ambulatory Visit: Payer: Self-pay

## 2024-05-13 NOTE — Progress Notes (Signed)
 Specialty Pharmacy Refill Coordination Note  Martha Osborne is a 73 y.o. female contacted today regarding refills of specialty medication(s) Abemaciclib  (VERZENIO )   Patient requested [name] at [facility] at [place] date: 05/15/24   Medication will be filled on: 05/14/24

## 2024-06-01 DIAGNOSIS — Z23 Encounter for immunization: Secondary | ICD-10-CM | POA: Diagnosis not present

## 2024-06-10 ENCOUNTER — Other Ambulatory Visit: Payer: Self-pay

## 2024-06-11 ENCOUNTER — Other Ambulatory Visit: Payer: Self-pay

## 2024-06-11 ENCOUNTER — Other Ambulatory Visit (HOSPITAL_COMMUNITY): Payer: Self-pay

## 2024-06-11 NOTE — Progress Notes (Signed)
 Specialty Pharmacy Refill Coordination Note  MyChart Questionnaire Submission  Martha Osborne is a 73 y.o. female contacted today regarding refills of specialty medication(s) Verzenio .  Doses on hand: (Patient-Rptd) 13   Patient requested: (Patient-Rptd) Pickup at [facility] at [facility] date: 06/14/24  Medication will be filled on 06/13/24

## 2024-06-13 ENCOUNTER — Other Ambulatory Visit: Payer: Self-pay

## 2024-06-18 ENCOUNTER — Other Ambulatory Visit: Payer: Self-pay

## 2024-06-18 NOTE — Progress Notes (Signed)
 Specialty Pharmacy Ongoing Clinical Assessment Note  Martha Osborne is a 73 y.o. female who is being followed by the specialty pharmacy service for RxSp Oncology   Patient's specialty medication(s) reviewed today: Abemaciclib  (VERZENIO )   Missed doses in the last 4 weeks: 0   Patient/Caregiver did not have any additional questions or concerns.   Therapeutic benefit summary: Patient is achieving benefit   Adverse events/side effects summary: Experienced adverse events/side effects (Patient reports fatigue, diarrhea, joint pain, and acid reflux, which is manageable with pantoprazole .  All side effects remain tolerable at this time.)   Patient's therapy is appropriate to: Continue    Goals Addressed             This Visit's Progress    Stabilization of disease   On track    Patient is on track. Patient will maintain adherence. Per Dr Jessy note from 02/16/24 the most recent PET showed no evidence of recurrent cancer.         Follow up: 6 months  Silvano LOISE Dolly Specialty Pharmacist

## 2024-07-03 ENCOUNTER — Other Ambulatory Visit: Payer: Self-pay

## 2024-07-03 NOTE — Progress Notes (Signed)
 Specialty Pharmacy Refill Coordination Note  Martha Osborne is a 73 y.o. female contacted today regarding refills of specialty medication(s) Abemaciclib  (VERZENIO )   Patient requested [name] at [facility] at [place] date: 07/12/24   Medication will be filled on: 07/11/24

## 2024-07-09 ENCOUNTER — Ambulatory Visit: Admitting: Hematology & Oncology

## 2024-07-09 ENCOUNTER — Other Ambulatory Visit

## 2024-07-11 ENCOUNTER — Other Ambulatory Visit: Payer: Self-pay

## 2024-07-15 ENCOUNTER — Encounter: Payer: Self-pay | Admitting: Hematology & Oncology

## 2024-07-15 ENCOUNTER — Inpatient Hospital Stay: Admitting: Hematology & Oncology

## 2024-07-15 ENCOUNTER — Other Ambulatory Visit: Payer: Self-pay

## 2024-07-15 ENCOUNTER — Inpatient Hospital Stay: Attending: Hematology & Oncology

## 2024-07-15 VITALS — BP 106/48 | HR 71 | Temp 98.8°F | Resp 16 | Ht 66.0 in | Wt 134.0 lb

## 2024-07-15 DIAGNOSIS — C50911 Malignant neoplasm of unspecified site of right female breast: Secondary | ICD-10-CM | POA: Diagnosis not present

## 2024-07-15 DIAGNOSIS — M810 Age-related osteoporosis without current pathological fracture: Secondary | ICD-10-CM | POA: Diagnosis not present

## 2024-07-15 DIAGNOSIS — Z780 Asymptomatic menopausal state: Secondary | ICD-10-CM | POA: Insufficient documentation

## 2024-07-15 DIAGNOSIS — Z170 Estrogen receptor positive status [ER+]: Secondary | ICD-10-CM | POA: Diagnosis not present

## 2024-07-15 DIAGNOSIS — M1712 Unilateral primary osteoarthritis, left knee: Secondary | ICD-10-CM | POA: Diagnosis not present

## 2024-07-15 DIAGNOSIS — Z79811 Long term (current) use of aromatase inhibitors: Secondary | ICD-10-CM | POA: Insufficient documentation

## 2024-07-15 DIAGNOSIS — Z1721 Progesterone receptor positive status: Secondary | ICD-10-CM | POA: Diagnosis not present

## 2024-07-15 DIAGNOSIS — Z1732 Human epidermal growth factor receptor 2 negative status: Secondary | ICD-10-CM | POA: Diagnosis not present

## 2024-07-15 DIAGNOSIS — C785 Secondary malignant neoplasm of large intestine and rectum: Secondary | ICD-10-CM

## 2024-07-15 DIAGNOSIS — C50912 Malignant neoplasm of unspecified site of left female breast: Secondary | ICD-10-CM | POA: Diagnosis present

## 2024-07-15 LAB — CMP (CANCER CENTER ONLY)
ALT: 26 U/L (ref 0–44)
AST: 32 U/L (ref 15–41)
Albumin: 4.2 g/dL (ref 3.5–5.0)
Alkaline Phosphatase: 172 U/L — ABNORMAL HIGH (ref 38–126)
Anion gap: 9 (ref 5–15)
BUN: 15 mg/dL (ref 8–23)
CO2: 27 mmol/L (ref 22–32)
Calcium: 9.4 mg/dL (ref 8.9–10.3)
Chloride: 101 mmol/L (ref 98–111)
Creatinine: 1 mg/dL (ref 0.44–1.00)
GFR, Estimated: 59 mL/min — ABNORMAL LOW
Glucose, Bld: 100 mg/dL — ABNORMAL HIGH (ref 70–99)
Potassium: 4.6 mmol/L (ref 3.5–5.1)
Sodium: 136 mmol/L (ref 135–145)
Total Bilirubin: 0.4 mg/dL (ref 0.0–1.2)
Total Protein: 7.1 g/dL (ref 6.5–8.1)

## 2024-07-15 LAB — CBC WITH DIFFERENTIAL (CANCER CENTER ONLY)
Abs Immature Granulocytes: 0.02 K/uL (ref 0.00–0.07)
Basophils Absolute: 0 K/uL (ref 0.0–0.1)
Basophils Relative: 0 %
Eosinophils Absolute: 0 K/uL (ref 0.0–0.5)
Eosinophils Relative: 1 %
HCT: 36.6 % (ref 36.0–46.0)
Hemoglobin: 12.1 g/dL (ref 12.0–15.0)
Immature Granulocytes: 0 %
Lymphocytes Relative: 26 %
Lymphs Abs: 1.5 K/uL (ref 0.7–4.0)
MCH: 31.8 pg (ref 26.0–34.0)
MCHC: 33.1 g/dL (ref 30.0–36.0)
MCV: 96.1 fL (ref 80.0–100.0)
Monocytes Absolute: 0.4 K/uL (ref 0.1–1.0)
Monocytes Relative: 7 %
Neutro Abs: 3.6 K/uL (ref 1.7–7.7)
Neutrophils Relative %: 66 %
Platelet Count: 126 K/uL — ABNORMAL LOW (ref 150–400)
RBC: 3.81 MIL/uL — ABNORMAL LOW (ref 3.87–5.11)
RDW: 12.5 % (ref 11.5–15.5)
WBC Count: 5.5 K/uL (ref 4.0–10.5)
nRBC: 0 % (ref 0.0–0.2)

## 2024-07-15 NOTE — Progress Notes (Signed)
 " Hematology and Oncology Follow Up Visit  Martha Osborne [id number] 03/26/1951 74 y.o. 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 daily Ribociclib  600 mg p.o. daily (21/7) -- d/c due to skin rash Prolia  60 mg IM q. 6 months -- next dose on 10/2024        Verzenio  100 mg po day - started on 08/12/2018-changed  on 10/2021                    Interim History:  [name] is back for follow-up.  We last saw her back in August.  Since then, everything is been pretty busy for her.  She and her husband are quite busy over the [place] season.  She went down to [place].  She has family down there.  She then went out to [place], [place] .  She saw the [facility].  Unfortunately, it had rained out there.  She is doing okay right now.  As always, she and her husband are doing things.  She has had no fever.  She has had no exposure to Influenza or COVID.  Of note, her last PET scan was done back in August.  We plan to get her set up with a another 1.  Her last CA 27.29 back in August was 32.  She currently is on Verzenio .  She is doing okay with this.  She has had no bleeding.  There is been no change in bowel or bladder habits.  She has had no leg swelling.  She has had no rashes.  Overall, I would say that her performance status is ECOG 1.  Medications:  Current Outpatient Medications:    abemaciclib  (VERZENIO ) 100 MG tablet, Take 1 tablet (100 mg total) by mouth daily., Disp: 30 tablet, Rfl: 6   Cholecalciferol (VITAMIN D3 PO), Take 2,000 Units by mouth daily., Disp: , Rfl:    denosumab  (PROLIA ) 60 MG/ML SOSY injection, Inject 60 mg into the skin every 6 (six) months., Disp: , Rfl:    diclofenac  Sodium (VOLTAREN ) 1 % GEL, Apply 2 g topically 4 (four) times daily as needed., Disp: , Rfl:    letrozole   (FEMARA ) 2.5 MG tablet, Take 1 tablet (2.5 mg total) by mouth daily., Disp: 90 tablet, Rfl: 6   loperamide (IMODIUM) 2 MG capsule, Take 2 mg by mouth as needed., Disp: , Rfl:    pantoprazole  (PROTONIX ) 40 MG tablet, Take 40 mg by mouth daily., Disp: , Rfl:    Psyllium (METAMUCIL) 48.57 % POWD, Take 1 packet by mouth daily., Disp: , Rfl:    VEVYE 0.1 % SOLN, Place 1 drop into both eyes 2 (two) times daily., Disp: , Rfl:   Allergies:  Allergies  Allergen Reactions   Kisqali (200 Mg Dose) [Ribociclib  Succ (200 Mg Dose)] Itching and Rash    scratchy throat.    Past Medical History, Surgical history, Social history, and Family History were reviewed and updated.  Review of Systems: Review of Systems  Constitutional: Negative.   HENT:  Negative.   Eyes: Negative.   Respiratory: Negative.   Cardiovascular: Negative.   Gastrointestinal: Positive for abdominal pain.  Endocrine: Negative.   Genitourinary: Negative.    Musculoskeletal: Negative.   Skin: Negative.   Neurological: Negative.   Hematological: Negative.   Psychiatric/Behavioral: Negative.     Physical  Exam:  height is 5' 6 (1.676 m) and weight is 134 lb (60.8 kg). Her oral temperature is 98.8 F (37.1 C). Her blood pressure is 106/48 (abnormal) and her pulse is 71. Her respiration is 16 and oxygen saturation is 99%.   Wt Readings from Last 3 Encounters:  07/15/24 134 lb (60.8 kg)  05/07/24 135 lb 3.2 oz (61.3 kg)  04/25/24 135 lb 3.2 oz (61.3 kg)    Physical Exam Vitals signs reviewed.  Constitutional:      Comments: Breast exam bilaterally shows no breast masses.  She has no nipple discharge bilaterally.  She has no breast swelling or erythema.  There is no bilateral axillary adenopathy.  HENT:     Head: Normocephalic and atraumatic.  Eyes:     Pupils: Pupils are equal, round, and reactive to light.  Neck:     Musculoskeletal: Normal range of motion.  Cardiovascular:     Rate and Rhythm: Normal rate and regular  rhythm.     Heart sounds: Normal heart sounds.  Pulmonary:     Effort: Pulmonary effort is normal.     Breath sounds: Normal breath sounds.  Abdominal:     General: Bowel sounds are normal.     Palpations: Abdomen is soft.     Comments: Abdominal exam shows the healing laparotomy scar.  She has no swelling.  There is no erythema about the surgical site.  She has been tenderness to palpation in the right lower quadrant.  No masses noted.  Bowel sounds are present.  There is no palpable liver or spleen tip.  Musculoskeletal: Normal range of motion.        General: No tenderness or deformity.  Lymphadenopathy:     Cervical: No cervical adenopathy.  Skin:    General: Skin is warm and dry.     Findings: No erythema or rash.  Neurological:     Mental Status: She is alert and oriented to person, place, and time.  Psychiatric:        Behavior: Behavior normal.        Thought Content: Thought content normal.        Judgment: Judgment normal.      Lab Results  Component Value Date   WBC 5.5 07/15/2024   HGB 12.1 07/15/2024   HCT 36.6 07/15/2024   MCV 96.1 07/15/2024   PLT 126 (L) 07/15/2024     Chemistry      Component Value Date/Time   NA 136 07/15/2024 1030   K 4.6 07/15/2024 1030   CL 101 07/15/2024 1030   CO2 27 07/15/2024 1030   BUN 15 07/15/2024 1030   CREATININE 1.00 07/15/2024 1030   CREATININE 0.91 04/25/2024 1153      Component Value Date/Time   CALCIUM 9.4 07/15/2024 1030   ALKPHOS 172 (H) 07/15/2024 1030   AST 32 07/15/2024 1030   ALT 26 07/15/2024 1030   BILITOT 0.4 07/15/2024 1030       Impression and Plan: [name] is a 74 year old postmenopausal female.  She has metastatic lobular carcinoma of the left breast.  We finally found her primary after doing a breast MRI.  She had resection of the colonic mass.  She had multiple positive lymph nodes.   Again, I do not see any evidence that the breast cancer is progressing.    We will set her up with  another PET scan before we see her back.  As always, she is always busy.  She and her  husband lead a very active life.  I am so happy for her.   "

## 2024-07-16 ENCOUNTER — Ambulatory Visit: Payer: Self-pay | Admitting: Hematology & Oncology

## 2024-07-16 LAB — CANCER ANTIGEN 27.29: CA 27.29: 25.1 U/mL (ref 0.0–38.6)

## 2024-08-01 ENCOUNTER — Other Ambulatory Visit: Payer: Self-pay

## 2024-08-02 ENCOUNTER — Other Ambulatory Visit: Payer: Self-pay | Admitting: Pharmacy Technician

## 2024-08-02 ENCOUNTER — Other Ambulatory Visit: Payer: Self-pay

## 2024-08-05 ENCOUNTER — Other Ambulatory Visit (HOSPITAL_COMMUNITY): Payer: Self-pay

## 2024-08-05 ENCOUNTER — Telehealth: Payer: Self-pay

## 2024-08-05 ENCOUNTER — Encounter: Payer: Self-pay | Admitting: Hematology & Oncology

## 2024-08-05 ENCOUNTER — Other Ambulatory Visit: Payer: Self-pay

## 2024-08-05 NOTE — Telephone Encounter (Signed)
 Oral Oncology Patient Advocate Encounter  Was successful in securing patient a $7500 grant from [facility] to provide copayment coverage for Verzenio .  This will keep the out of pocket expense at $0.     [employer] ID: [id number]  *Pending Diagnosis Verification *08-07-24 Approved   The billing information is as follows and has been shared with [facility].    RxBin: [id number] PCN: [id number] Member ID: [id number] Group ID: [id number] Dates of Eligibility: 07/06/2024 through 07/05/2025  Fund:  Breast Cancer - Medicare Access   Charlott Hamilton,  CPhT-Adv  she/her/hers [facility]  [facility] Pharmacy Technician Patient Advocate Specialist III [facility] Phone: [phone]  Fax: [phone] [email or web address]

## 2024-08-06 ENCOUNTER — Ambulatory Visit: Admitting: Adult Health

## 2024-08-07 ENCOUNTER — Other Ambulatory Visit: Payer: Self-pay

## 2024-08-08 ENCOUNTER — Other Ambulatory Visit: Payer: Self-pay

## 2024-08-09 ENCOUNTER — Other Ambulatory Visit (HOSPITAL_COMMUNITY): Payer: Self-pay

## 2024-08-09 ENCOUNTER — Other Ambulatory Visit: Payer: Self-pay

## 2024-08-09 NOTE — Progress Notes (Signed)
 Specialty Pharmacy Refill Coordination Note  Spoke with Martha Osborne is a 74 y.o. female contacted today regarding refills of specialty medication(s) Abemaciclib  (VERZENIO )  Doses on hand: 9   Patient requested: Pickup at [facility] at [place] date: 08/12/24  Medication will be filled on 08/09/24  Also fill Letrozole  per patient request

## 2024-10-29 ENCOUNTER — Inpatient Hospital Stay

## 2024-10-29 ENCOUNTER — Inpatient Hospital Stay: Admitting: Hematology & Oncology

## 2024-12-23 ENCOUNTER — Ambulatory Visit: Payer: Self-pay | Admitting: Adult Health

## 2025-04-21 ENCOUNTER — Ambulatory Visit: Payer: Self-pay | Admitting: Adult Health
